# Patient Record
Sex: Female | Born: 1990 | Race: White | Hispanic: No | Smoking: Former smoker
Health system: Southern US, Community
[De-identification: ages and names within clinical notes are randomized; demographics above are authoritative.]

## PROBLEM LIST (undated history)

## (undated) ENCOUNTER — Inpatient Hospital Stay (HOSPITAL_COMMUNITY): Payer: Self-pay

## (undated) DIAGNOSIS — R41840 Attention and concentration deficit: Secondary | ICD-10-CM

## (undated) DIAGNOSIS — D649 Anemia, unspecified: Secondary | ICD-10-CM

## (undated) DIAGNOSIS — F319 Bipolar disorder, unspecified: Secondary | ICD-10-CM

## (undated) DIAGNOSIS — B009 Herpesviral infection, unspecified: Secondary | ICD-10-CM

## (undated) DIAGNOSIS — R87629 Unspecified abnormal cytological findings in specimens from vagina: Secondary | ICD-10-CM

## (undated) DIAGNOSIS — F419 Anxiety disorder, unspecified: Secondary | ICD-10-CM

## (undated) DIAGNOSIS — N946 Dysmenorrhea, unspecified: Secondary | ICD-10-CM

## (undated) DIAGNOSIS — J189 Pneumonia, unspecified organism: Secondary | ICD-10-CM

## (undated) DIAGNOSIS — N200 Calculus of kidney: Secondary | ICD-10-CM

## (undated) DIAGNOSIS — N390 Urinary tract infection, site not specified: Secondary | ICD-10-CM

## (undated) DIAGNOSIS — O009 Unspecified ectopic pregnancy without intrauterine pregnancy: Secondary | ICD-10-CM

## (undated) DIAGNOSIS — G4730 Sleep apnea, unspecified: Secondary | ICD-10-CM

## (undated) DIAGNOSIS — N83209 Unspecified ovarian cyst, unspecified side: Secondary | ICD-10-CM

## (undated) DIAGNOSIS — F32A Depression, unspecified: Secondary | ICD-10-CM

## (undated) DIAGNOSIS — Z202 Contact with and (suspected) exposure to infections with a predominantly sexual mode of transmission: Secondary | ICD-10-CM

## (undated) DIAGNOSIS — C419 Malignant neoplasm of bone and articular cartilage, unspecified: Secondary | ICD-10-CM

## (undated) DIAGNOSIS — R51 Headache: Secondary | ICD-10-CM

## (undated) DIAGNOSIS — R519 Headache, unspecified: Secondary | ICD-10-CM

## (undated) DIAGNOSIS — K219 Gastro-esophageal reflux disease without esophagitis: Secondary | ICD-10-CM

## (undated) HISTORY — PX: TONSILLECTOMY: SUR1361

## (undated) HISTORY — PX: DILATION AND CURETTAGE OF UTERUS: SHX78

## (undated) HISTORY — DX: Anxiety disorder, unspecified: F41.9

## (undated) HISTORY — PX: TONSILECTOMY, ADENOIDECTOMY, BILATERAL MYRINGOTOMY AND TUBES: SHX2538

## (undated) HISTORY — DX: Unspecified abnormal cytological findings in specimens from vagina: R87.629

## (undated) HISTORY — PX: TUBAL LIGATION: SHX77

## (undated) HISTORY — PX: NASAL SINUS SURGERY: SHX719

## (undated) HISTORY — DX: Contact with and (suspected) exposure to infections with a predominantly sexual mode of transmission: Z20.2

---

## 2011-09-12 ENCOUNTER — Emergency Department (HOSPITAL_COMMUNITY): Payer: BC Managed Care – PPO

## 2011-09-12 ENCOUNTER — Encounter (HOSPITAL_COMMUNITY): Payer: Self-pay

## 2011-09-12 ENCOUNTER — Observation Stay (HOSPITAL_COMMUNITY)
Admission: EM | Admit: 2011-09-12 | Discharge: 2011-09-13 | Disposition: A | Discharge status: Home / Self Care | Payer: BC Managed Care – PPO | Attending: Emergency Medicine | Admitting: Emergency Medicine

## 2011-09-12 DIAGNOSIS — N949 Unspecified condition associated with female genital organs and menstrual cycle: Principal | ICD-10-CM | POA: Insufficient documentation

## 2011-09-12 DIAGNOSIS — R41840 Attention and concentration deficit: Secondary | ICD-10-CM | POA: Insufficient documentation

## 2011-09-12 DIAGNOSIS — F319 Bipolar disorder, unspecified: Secondary | ICD-10-CM | POA: Insufficient documentation

## 2011-09-12 DIAGNOSIS — K661 Hemoperitoneum: Secondary | ICD-10-CM

## 2011-09-12 DIAGNOSIS — R102 Pelvic and perineal pain: Secondary | ICD-10-CM | POA: Diagnosis present

## 2011-09-12 DIAGNOSIS — N83209 Unspecified ovarian cyst, unspecified side: Secondary | ICD-10-CM | POA: Diagnosis present

## 2011-09-12 DIAGNOSIS — N831 Corpus luteum cyst of ovary, unspecified side: Secondary | ICD-10-CM | POA: Insufficient documentation

## 2011-09-12 HISTORY — DX: Bipolar disorder, unspecified: F31.9

## 2011-09-12 HISTORY — DX: Attention and concentration deficit: R41.840

## 2011-09-12 LAB — URINALYSIS, MICROSCOPIC ONLY
Bilirubin Urine: NEGATIVE
Protein, ur: NEGATIVE mg/dL
Specific Gravity, Urine: 1.025 (ref 1.005–1.030)
Urobilinogen, UA: 0.2 mg/dL (ref 0.0–1.0)

## 2011-09-12 LAB — COMPREHENSIVE METABOLIC PANEL
ALT: 13 U/L (ref 0–35)
Albumin: 4.3 g/dL (ref 3.5–5.2)
Alkaline Phosphatase: 63 U/L (ref 39–117)
Potassium: 3.4 mEq/L — ABNORMAL LOW (ref 3.5–5.1)
Sodium: 137 mEq/L (ref 135–145)
Total Protein: 6.9 g/dL (ref 6.0–8.3)

## 2011-09-12 LAB — CBC
HCT: 40 % (ref 36.0–46.0)
HCT: 36.7 % (ref 36.0–46.0)
Hemoglobin: 12.2 g/dL (ref 12.0–15.0)
MCH: 30.4 pg (ref 26.0–34.0)
MCHC: 34 g/dL (ref 30.0–36.0)
MCHC: 33.2 g/dL (ref 30.0–36.0)
MCV: 89.5 fL (ref 78.0–100.0)
MCV: 90.8 fL (ref 78.0–100.0)
Platelets: 203 10*3/uL (ref 150–400)
RBC: 4.04 MIL/uL (ref 3.87–5.11)
RBC: 4.03 MIL/uL (ref 3.87–5.11)
RDW: 12.7 % (ref 11.5–15.5)
WBC: 12.3 10*3/uL — ABNORMAL HIGH (ref 4.0–10.5)
WBC: 11.1 10*3/uL — ABNORMAL HIGH (ref 4.0–10.5)

## 2011-09-12 LAB — DIFFERENTIAL
Basophils Absolute: 0 10*3/uL (ref 0.0–0.1)
Basophils Relative: 0 % (ref 0–1)
Eosinophils Relative: 1 % (ref 0–5)
Monocytes Absolute: 0.6 10*3/uL (ref 0.1–1.0)

## 2011-09-12 MED ORDER — OXYCODONE-ACETAMINOPHEN 5-325 MG PO TABS
1.0000 | ORAL_TABLET | ORAL | Status: DC | PRN
  Administered 2011-09-12 (×2): 2 via ORAL
  Administered 2011-09-13: 1 via ORAL
  Administered 2011-09-13: 2 via ORAL
  Administered 2011-09-13: 1 via ORAL
  Filled 2011-09-12: qty 2
  Filled 2011-09-12: qty 1
  Filled 2011-09-12 (×3): qty 2

## 2011-09-12 MED ORDER — NALOXONE HCL 0.4 MG/ML IJ SOLN: 0.4000 mg | INTRAMUSCULAR | Status: DC | PRN

## 2011-09-12 MED ORDER — DIPHENHYDRAMINE HCL 50 MG/ML IJ SOLN: 12.5000 mg | Freq: Four times a day (QID) | INTRAMUSCULAR | Status: DC | PRN

## 2011-09-12 MED ORDER — MORPHINE SULFATE 4 MG/ML IJ SOLN
6.0000 mg | Freq: Once | INTRAMUSCULAR | Status: AC
  Administered 2011-09-12: 6 mg via INTRAVENOUS
  Filled 2011-09-12: qty 2

## 2011-09-12 MED ORDER — HYDROMORPHONE 0.3 MG/ML IV SOLN
INTRAVENOUS | Status: DC
  Filled 2011-09-12: qty 25

## 2011-09-12 MED ORDER — ONDANSETRON HCL 4 MG/2ML IJ SOLN
4.0000 mg | Freq: Once | INTRAMUSCULAR | Status: AC
  Administered 2011-09-12: 4 mg via INTRAVENOUS
  Filled 2011-09-12: qty 2

## 2011-09-12 MED ORDER — DEXTROSE IN LACTATED RINGERS 5 % IV SOLN
INTRAVENOUS | Status: DC
  Administered 2011-09-12: 21:00:00 via INTRAVENOUS

## 2011-09-12 MED ORDER — ONDANSETRON HCL 4 MG/2ML IJ SOLN: 4.0000 mg | Freq: Once | INTRAMUSCULAR | Status: DC

## 2011-09-12 MED ORDER — IBUPROFEN 600 MG PO TABS: ORAL_TABLET | ORAL | Status: DC

## 2011-09-12 MED ORDER — ONDANSETRON HCL 4 MG/2ML IJ SOLN: 4.0000 mg | Freq: Four times a day (QID) | INTRAMUSCULAR | Status: DC | PRN

## 2011-09-12 MED ORDER — SODIUM CHLORIDE 0.9 % IJ SOLN: 9.0000 mL | INTRAMUSCULAR | Status: DC | PRN

## 2011-09-12 MED ORDER — HYDROMORPHONE HCL PF 1 MG/ML IJ SOLN
1.0000 mg | Freq: Once | INTRAMUSCULAR | Status: AC
  Administered 2011-09-12: 1 mg via INTRAVENOUS
  Filled 2011-09-12: qty 1

## 2011-09-12 MED ORDER — HYDROMORPHONE 0.3 MG/ML IV SOLN: INTRAVENOUS | Status: DC

## 2011-09-12 MED ORDER — OXYCODONE-ACETAMINOPHEN 5-325 MG PO TABS: 1.0000 | ORAL_TABLET | ORAL | Status: AC | PRN

## 2011-09-12 MED ORDER — DIPHENHYDRAMINE HCL 12.5 MG/5ML PO ELIX
12.5000 mg | ORAL_SOLUTION | Freq: Four times a day (QID) | ORAL | Status: DC | PRN
Start: 2011-09-12 — End: 2011-09-13

## 2011-09-12 MED ORDER — MORPHINE SULFATE 4 MG/ML IJ SOLN
6.0000 mg | Freq: Once | INTRAMUSCULAR | Status: AC
  Administered 2011-09-12: 6 mg via INTRAVENOUS
  Filled 2011-09-12: qty 1

## 2011-09-12 MED ORDER — PRENATAL MULTIVITAMIN CH: 1.0000 | ORAL_TABLET | Freq: Every day | ORAL | Status: DC

## 2011-09-12 NOTE — ED Notes (Addendum)
Spoke with mother on phone,

## 2011-09-12 NOTE — Consult Note (Signed)
Reason for Consult:pelvic pain. Hemorrhagic ovarian cyst. Hemoperitoneum. Referring Physician: Dr. Azalia Bilis  Danielle Evans is an 21 y.o. female.   Pertinent Gynecological History: Menses: regular on oral contraceptive pill Bleeding: occasional breakthrough bleeding Contraception: Ortho Tri-Cyclen though this is not taken on a regular basis Sexually transmitted diseases: no past history Previous GYN Procedures: none  Last mammogram: na Date: na Last pap: na Date: na OB History:G0P0   Menstrual History:  Patient's last menstrual period was 08/29/2011.    Past Medical History  Diagnosis Date  . Bipolar 1 disorder   . Attention and concentration deficit     History reviewed. No pertinent past surgical history.  Family History  Problem Relation Age of Onset  . Cancer Father   . Heart disease Father   . Cancer Paternal Aunt     Social History:  does not have a smoking history on file. She does not have any smokeless tobacco history on file. She reports that she drinks alcohol. Her drug history not on file.  Allergies:  Allergies  Allergen Reactions  . Ceclor (Cefaclor)     Medications: I have reviewed the patient's current medications Gastrointestinal ROS: positive for - abdominal pain and appetite loss  Blood pressure 113/59, pulse 81, temperature 97.8 F (36.6 C), temperature source Oral, resp. rate 18, height 5\' 7"  (1.702 m), weight 199 lb 12 oz (90.606 kg), last menstrual period 08/29/2011, SpO2 97.00%. .the abdomen is soft without organomegaly. There is tenderness to deep palpation in the lower abdomen that is greater than in the epigastrium. There is no rebound. There is no CVA tenderness. Pelvic examination: Deferred  Results for orders placed during the hospital encounter of 09/12/11 (from the past 48 hour(s))  COMPREHENSIVE METABOLIC PANEL     Status: Abnormal   Collection Time   09/12/11  5:00 AM      Component Value Range Comment   Sodium 137  135 -  145 (mEq/L)    Potassium 3.4 (*) 3.5 - 5.1 (mEq/L)    Chloride 104  96 - 112 (mEq/L)    CO2 21  19 - 32 (mEq/L)    Glucose, Bld 99  70 - 99 (mg/dL)    BUN 11  6 - 23 (mg/dL)    Creatinine, Ser 1.61  0.50 - 1.10 (mg/dL)    Calcium 9.4  8.4 - 10.5 (mg/dL)    Total Protein 6.9  6.0 - 8.3 (g/dL)    Albumin 4.3  3.5 - 5.2 (g/dL)    AST 22  0 - 37 (U/L)    ALT 13  0 - 35 (U/L)    Alkaline Phosphatase 63  39 - 117 (U/L)    Total Bilirubin 0.4  0.3 - 1.2 (mg/dL)    GFR calc non Af Amer >90  >90 (mL/min)    GFR calc Af Amer >90  >90 (mL/min)   CBC     Status: Normal   Collection Time   09/12/11  5:00 AM      Component Value Range Comment   WBC 8.6  4.0 - 10.5 (K/uL)    RBC 4.47  3.87 - 5.11 (MIL/uL)    Hemoglobin 13.6  12.0 - 15.0 (g/dL)    HCT 09.6  04.5 - 40.9 (%)    MCV 89.5  78.0 - 100.0 (fL)    MCH 30.4  26.0 - 34.0 (pg)    MCHC 34.0  30.0 - 36.0 (g/dL)    RDW 81.1  91.4 - 78.2 (%)  Platelets 232  150 - 400 (K/uL)   DIFFERENTIAL     Status: Normal   Collection Time   09/12/11  5:00 AM      Component Value Range Comment   Neutrophils Relative 66  43 - 77 (%)    Neutro Abs 5.7  1.7 - 7.7 (K/uL)    Lymphocytes Relative 27  12 - 46 (%)    Lymphs Abs 2.3  0.7 - 4.0 (K/uL)    Monocytes Relative 6  3 - 12 (%)    Monocytes Absolute 0.6  0.1 - 1.0 (K/uL)    Eosinophils Relative 1  0 - 5 (%)    Eosinophils Absolute 0.1  0.0 - 0.7 (K/uL)    Basophils Relative 0  0 - 1 (%)    Basophils Absolute 0.0  0.0 - 0.1 (K/uL)   PREGNANCY, URINE     Status: Normal   Collection Time   09/12/11  5:45 AM      Component Value Range Comment   Preg Test, Ur NEGATIVE  NEGATIVE    URINALYSIS, WITH MICROSCOPIC     Status: Abnormal   Collection Time   09/12/11  5:45 AM      Component Value Range Comment   Color, Urine YELLOW  YELLOW     APPearance CLEAR  CLEAR     Specific Gravity, Urine 1.025  1.005 - 1.030     pH 6.5  5.0 - 8.0     Glucose, UA NEGATIVE  NEGATIVE (mg/dL)    Hgb urine dipstick  LARGE (*) NEGATIVE     Bilirubin Urine NEGATIVE  NEGATIVE     Ketones, ur TRACE (*) NEGATIVE (mg/dL)    Protein, ur NEGATIVE  NEGATIVE (mg/dL)    Urobilinogen, UA 0.2  0.0 - 1.0 (mg/dL)    Nitrite NEGATIVE  NEGATIVE     Leukocytes, UA NEGATIVE  NEGATIVE     WBC, UA 0-2  <3 (WBC/hpf)    RBC / HPF 0-2  <3 (RBC/hpf)    Bacteria, UA RARE  RARE     Squamous Epithelial / LPF FEW (*) RARE    CBC     Status: Abnormal   Collection Time   09/12/11 10:10 AM      Component Value Range Comment   WBC 12.3 (*) 4.0 - 10.5 (K/uL)    RBC 4.04  3.87 - 5.11 (MIL/uL)    Hemoglobin 12.1  12.0 - 15.0 (g/dL)    HCT 16.1  09.6 - 04.5 (%)    MCV 90.8  78.0 - 100.0 (fL)    MCH 30.0  26.0 - 34.0 (pg)    MCHC 33.0  30.0 - 36.0 (g/dL)    RDW 40.9  81.1 - 91.4 (%)    Platelets 203  150 - 400 (K/uL)   CBC     Status: Abnormal   Collection Time   09/12/11  2:35 PM      Component Value Range Comment   WBC 11.1 (*) 4.0 - 10.5 (K/uL)    RBC 4.03  3.87 - 5.11 (MIL/uL)    Hemoglobin 12.2  12.0 - 15.0 (g/dL)    HCT 78.2  95.6 - 21.3 (%)    MCV 91.1  78.0 - 100.0 (fL)    MCH 30.3  26.0 - 34.0 (pg)    MCHC 33.2  30.0 - 36.0 (g/dL)    RDW 08.6  57.8 - 46.9 (%)    Platelets 209  150 - 400 (K/uL)  Ct Abdomen Pelvis Wo Contrast  09/12/2011  *RADIOLOGY REPORT*  Clinical Data: Sudden onset of lower abdominal pain.  Pain is worse on the right.  Microhematuria.  CT ABDOMEN AND PELVIS WITHOUT CONTRAST  Technique:  Multidetector CT imaging of the abdomen and pelvis was performed following the standard protocol without intravenous contrast.  Comparison: None.  Findings: Focal airspace disease the right lung base likely represents atelectasis.  Lungs are otherwise clear.  Heart size is normal.  The liver and spleen are within normal limits.  The stomach, duodenum, and pancreas are unremarkable.  The common bile duct and gallbladder are within normal limits.  The adrenal glands and kidneys are normal.  Ureters are within normal  limits to the urinary bladder.  The rectosigmoid colon is within normal limits.  The remainder of the colon is unremarkable.  Moderate abdominal ascites is most prominent in the lower abdomen pelvis.  There is some fluid about the spleen.  Fluid is high density, compatible with blood products. The mixed density mass in the left adnexa measures 4.9 x 3.6 x 4.3 cm.  The right adnexa is within normal limits.  The uterus is unremarkable.  There is no free air.  The bone windows are unremarkable.  IMPRESSION:  1.  High-density abdominal ascites likely represents blood products, predominately focused in the anatomic pelvis.  There is some fluid about the spleen. Focal hyperdensity within the anatomic pelvis likely represents clot. 2.  Mixed density mass in the left adnexa measures 4.9 cm.  This likely represents a hemorrhagic cyst and is felt be the source of the hemoperitoneum.  Original Report Authenticated By: Jamesetta Orleans. MATTERN, M.D.    Assessment: 1 pelvic pain left ovarian cyst and hemoperitoneum  Plan: The patient will be admitted at [facility] for observation. She does not have a surgical abdomen and if her hemoglobin remains stable and she is able to resume gastrointestinal function with tolerating a regular diet she will be tried on oral pain medication. If that relieves her pain and she tolerates it she will be discharged home.  Jaiyden Laur P 09/12/2011

## 2011-09-12 NOTE — Discharge Summary (Signed)
Physician Discharge Summary  Patient ID: Danielle Evans MRN: [id number] DOB/AGE: 1990/09/06 21 y.o.  Admit date: 09/12/2011 Discharge date: 09/12/2011  Admission Diagnoses:pelvic pain  Discharge Diagnoses:  Active Problems:  Pelvic pain  Hemoperitoneum  Ovarian cyst   Discharged Condition: stable  Hospital Course: the patient was seen in the [facility] long emergency department with a complaint of the rapid onset of pelvic abdominal and some back pain here.  Her last menstrual period had been on 08/29/2011. She is taking Ortho Tri-Cyclen oral contraceptive pills but is not taking them consistently. She states that over the last couple of days at decreasing appetite but no significant nausea or vomiting. She likewise denies change in her bowel habits. She underwent a workup which included a negative pregnancy test. A CBC which was essentially within normal limits with a hemoglobin of 13.6, and a pelvic CT scan consistent with a ruptured corpus luteum cyst and hemoperitoneum. The patient was treated with IV analgesics in the [facility] long emergency department and was transferred to [facility] for further gynecologic consultation and  Management.  She was observed over approximately 8 hours during which time she required a single dose of Dilaudid 1 mg IV for improvement in her pain from a level of 6 to a level of 2-3. She had documented stable ligation of her hemoglobin in the 12.1-12.2 range over a 4-5 hour. She had no deterioration in her physical examination.  Her orthostatic pulses and blood pressures were stable. She was able to eat a regular diet and to take oral pain medication.  She was not deemed ready for discharge home.  Consults: gynecology  Significant Diagnostic Studies: CT  Of abdomen and pelvis, CBC  Treatments: analgesia: Dilaudid and Percocer  Discharge Exam: Blood pressure 113/59, pulse 81, temperature 97.8 F (36.6 C), temperature source Oral, resp. rate 18, height 5\' 7"   (1.702 m), weight 199 lb 12 oz (90.606 kg), last menstrual period 08/29/2011, SpO2 97.00%. GI: soft, non-tender; bowel sounds normal; no masses,  no organomegaly No rebound.  Disposition: Discharge home.   Excuse from work until 09/16/11  Discharge Orders    Future Appointments: Provider: Department: Dept Phone: Center:   09/24/2011 1:45 PM Hal Morales, MD [facility] [phone] None     Medication List  As of 09/12/2011  5:51 PM   TAKE these medications         amphetamine-dextroamphetamine 30 MG 24 hr capsule   Commonly known as: ADDERALL XR   Take 30 mg by mouth every morning.      ibuprofen 600 MG tablet   Commonly known as: ADVIL,MOTRIN   One tablet every 6 hours as needed for mild to moderate pain      lamoTRIgine 25 MG tablet   Commonly known as: LAMICTAL   Take 50 mg by mouth daily.      ORTHO TRI-CYCLEN (28) 0.18/0.215/0.25 MG-35 MCG tablet   Generic drug: Norgestimate-Ethinyl Estradiol Triphasic   Take 1 tablet by mouth daily.      oxyCODONE-acetaminophen 5-325 MG per tablet   Commonly known as: PERCOCET   Take 1 tablet by mouth every 4 (four) hours as needed for pain.           Follow-up Information    Follow up with Hal Morales, MD on 09/24/2011. (09/24/11 @145pm )    Contact information:   [address] [place] [address] [phone]          Signed: Hal Morales 09/12/2011, 5:51 PM

## 2011-09-12 NOTE — MAU Note (Signed)
Patient received by [facility] to room 1 in MAU side rails up for safety patient rating her pain 6/10.

## 2011-09-12 NOTE — MAU Note (Signed)
Notified Dr. Pennie Rushing patient arrived to MAU in room 1 from [facility] via [facility], received order for CBC, MD unable to evaluate patient until finished in OR.

## 2011-09-12 NOTE — ED Provider Notes (Signed)
History     CSN: [id number]  Arrival date & time 09/12/11  0445   First MD Initiated Contact with Patient 09/12/11 [phone]      Chief Complaint  Patient presents with  . Abdominal Pain     HPI The patient reports acute onset lower abdominal pain that started approximately 30 minutes prior to arrival.  She's never had pain like this before.  She reports nausea without vomiting.  She denies diarrhea.  She denies vaginal bleeding.  Her last normal menstrual cycle was approximately 3 weeks ago.  She denies dysuria and urinary frequency.  Her pain is moderate to severe at this time.  Nothing worsens her pain.  Nothing improves her pain.  Her pain is constant   Past Medical History  Diagnosis Date  . Bipolar 1 disorder   . Attention and concentration deficit     History reviewed. No pertinent past surgical history.  History reviewed. No pertinent family history.  History  Substance Use Topics  . Smoking status: Not on file  . Smokeless tobacco: Not on file  . Alcohol Use: Yes    OB History    Grav Para Term Preterm Abortions TAB SAB Ect Mult Living                  Review of Systems  All other systems reviewed and are negative.    Allergies  Ceclor  Home Medications   Current Outpatient Rx  Name Route Sig Dispense Refill  . AMPHETAMINE-DEXTROAMPHET ER 30 MG PO CP24 Oral Take 30 mg by mouth every morning.    Marland Kitchen LAMOTRIGINE 25 MG PO TABS Oral Take 50 mg by mouth daily.      BP 118/64  Pulse 72  Temp(Src) 98.5 F (36.9 C) (Oral)  Resp 20  SpO2 100%  LMP 08/29/2011  Physical Exam  Nursing note and vitals reviewed. Constitutional: She is oriented to person, place, and time. She appears well-developed and well-nourished. No distress.  HENT:  Head: Normocephalic and atraumatic.  Eyes: EOM are normal.  Neck: Normal range of motion.  Cardiovascular: Normal rate, regular rhythm and normal heart sounds.   Pulmonary/Chest: Effort normal and breath sounds normal.    Abdominal: Soft. She exhibits no distension.       Left lower quadrant tenderness mild guarding, no rebound, no peritonitis  Musculoskeletal: Normal range of motion.  Neurological: She is alert and oriented to person, place, and time.  Skin: Skin is warm and dry.  Psychiatric: She has a normal mood and affect. Judgment normal.    ED Course  Procedures (including critical care time)  Labs Reviewed  COMPREHENSIVE METABOLIC PANEL - Abnormal; Notable for the following:    Potassium 3.4 (*)    All other components within normal limits  URINALYSIS, WITH MICROSCOPIC - Abnormal; Notable for the following:    Hgb urine dipstick LARGE (*)    Ketones, ur TRACE (*)    Squamous Epithelial / LPF FEW (*)    All other components within normal limits  CBC  DIFFERENTIAL  PREGNANCY, URINE   Ct Abdomen Pelvis Wo Contrast  09/12/2011  *RADIOLOGY REPORT*  Clinical Data: Sudden onset of lower abdominal pain.  Pain is worse on the right.  Microhematuria.  CT ABDOMEN AND PELVIS WITHOUT CONTRAST  Technique:  Multidetector CT imaging of the abdomen and pelvis was performed following the standard protocol without intravenous contrast.  Comparison: None.  Findings: Focal airspace disease the right lung base likely represents atelectasis.  Lungs  are otherwise clear.  Heart size is normal.  The liver and spleen are within normal limits.  The stomach, duodenum, and pancreas are unremarkable.  The common bile duct and gallbladder are within normal limits.  The adrenal glands and kidneys are normal.  Ureters are within normal limits to the urinary bladder.  The rectosigmoid colon is within normal limits.  The remainder of the colon is unremarkable.  Moderate abdominal ascites is most prominent in the lower abdomen pelvis.  There is some fluid about the spleen.  Fluid is high density, compatible with blood products. The mixed density mass in the left adnexa measures 4.9 x 3.6 x 4.3 cm.  The right adnexa is within normal  limits.  The uterus is unremarkable.  There is no free air.  The bone windows are unremarkable.  IMPRESSION:  1.  High-density abdominal ascites likely represents blood products, predominately focused in the anatomic pelvis.  There is some fluid about the spleen. Focal hyperdensity within the anatomic pelvis likely represents clot. 2.  Mixed density mass in the left adnexa measures 4.9 cm.  This likely represents a hemorrhagic cyst and is felt be the source of the hemoperitoneum.  Original Report Authenticated By: Jamesetta Orleans. MATTERN, M.D.   I personally reviewed the patient's images  1. Hemorrhagic ovarian cyst   2. Hemoperitoneum       MDM  The patient appears to have a left-sided hemorrhagic cyst with moderate amount of free fluid in her abdomen concerning for ongoing hemorrhagic bleed.  The patient continues to have pain additional pain medicine and has been given.  Awaiting callback from GYN at this time as she may require laparoscopy to evaluate given the fact that there is blood products around her spleen.  Her vital signs remained stable however at this time.    8:19 AM Her current heart rate is 86 her blood pressure is 118/76  8:31 AM Accepted to [facility] at [facility] by Dr Karna Christmas, MD 09/12/11 [phone]

## 2011-09-12 NOTE — ED Notes (Signed)
Pt states she acutely started hurting about 30 minutes ago in her lower abdomen

## 2011-09-12 NOTE — Discharge Instructions (Signed)
Arrive at 115 pm for your appointment to be able to fill out new patient paperwork.    Ovarian Cyst The ovaries are small organs that are on each side of the uterus. The ovaries are the organs that produce the female hormones, estrogen and progesterone. An ovarian cyst is a sac filled with fluid that can vary in its size. It is normal for a small cyst to form in women who are in the childbearing age and who have menstrual periods. This type of cyst is called a follicle cyst that becomes an ovulation cyst (corpus luteum cyst) after it produces the women's egg. It later goes away on its own if the woman does not become pregnant. There are other kinds of ovarian cysts that may cause problems and may need to be treated. The most serious problem is a cyst with cancer. It should be noted that menopausal women who have an ovarian cyst are at a higher risk of it being a cancer cyst. They should be evaluated very quickly, thoroughly and followed closely. This is especially true in menopausal women because of the high rate of ovarian cancer in women in menopause. CAUSES AND TYPES OF OVARIAN CYSTS:  FUNCTIONAL CYST: The follicle/corpus luteum cyst is a functional cyst that occurs every month during ovulation with the menstrual cycle. They go away with the next menstrual cycle if the woman does not get pregnant. Usually, there are no symptoms with a functional cyst.   ENDOMETRIOMA CYST: This cyst develops from the lining of the uterus tissue. This cyst gets in or on the ovary. It grows every month from the bleeding during the menstrual period. It is also called a "chocolate cyst" because it becomes filled with blood that turns brown. This cyst can cause pain in the lower abdomen during intercourse and with your menstrual period.   CYSTADENOMA CYST: This cyst develops from the cells on the outside of the ovary. They usually are not cancerous. They can get very big and cause lower abdomen pain and pain with  intercourse. This type of cyst can twist on itself, cut off its blood supply and cause severe pain. It also can easily rupture and cause a lot of pain.   DERMOID CYST: This type of cyst is sometimes found in both ovaries. They are found to have different kinds of body tissue in the cyst. The tissue includes skin, teeth, hair, and/or cartilage. They usually do not have symptoms unless they get very big. Dermoid cysts are rarely cancerous.   POLYCYSTIC OVARY: This is a rare condition with hormone problems that produces many small cysts on both ovaries. The cysts are follicle-like cysts that never produce an egg and become a corpus luteum. It can cause an increase in body weight, infertility, acne, increase in body and facial hair and lack of menstrual periods or rare menstrual periods. Many women with this problem develop type 2 diabetes. The exact cause of this problem is unknown. A polycystic ovary is rarely cancerous.   THECA LUTEIN CYST: Occurs when too much hormone (human chorionic gonadotropin) is produced and over-stimulates the ovaries to produce an egg. They are frequently seen when doctors stimulate the ovaries for invitro-fertilization (test tube babies).   LUTEOMA CYST: This cyst is seen during pregnancy. Rarely it can cause an obstruction to the birth canal during labor and delivery. They usually go away after delivery.  SYMPTOMS   Pelvic pain or pressure.   Pain during sexual intercourse.   Increasing girth (swelling)  of the abdomen.   Abnormal menstrual periods.   Increasing pain with menstrual periods.   You stop having menstrual periods and you are not pregnant.  DIAGNOSIS  The diagnosis can be made during:  Routine or annual pelvic examination (common).   Ultrasound.   X-ray of the pelvis.   CT Scan.   MRI.   Blood tests.  TREATMENT   Treatment may only be to follow the cyst monthly for 2 to 3 months with your caregiver. Many go away on their own, especially  functional cysts.   May be aspirated (drained) with a long needle with ultrasound, or by laparoscopy (inserting a tube into the pelvis through a small incision).   The whole cyst can be removed by laparoscopy.   Sometimes the cyst may need to be removed through an incision in the lower abdomen.   Hormone treatment is sometimes used to help dissolve certain cysts.   Birth control pills are sometimes used to help dissolve certain cysts.  HOME CARE INSTRUCTIONS  Follow your caregiver's advice regarding:  Medicine.   Follow up visits to evaluate and treat the cyst.   You may need to come back or make an appointment with another caregiver, to find the exact cause of your cyst, if your caregiver is not a gynecologist.   Get your yearly and recommended pelvic examinations and Pap tests.   Let your caregiver know if you have had an ovarian cyst in the past.  SEEK MEDICAL CARE IF:   Your periods are late, irregular, they stop, or are painful.   Your stomach (abdomen) or pelvic pain does not go away.   Your stomach becomes larger or swollen.   You have pressure on your bladder or trouble emptying your bladder completely.   You have painful sexual intercourse.   You have feelings of fullness, pressure, or discomfort in your stomach.   You lose weight for no apparent reason.   You feel generally ill.   You become constipated.   You lose your appetite.   You develop acne.   You have an increase in body and facial hair.   You are gaining weight, without changing your exercise and eating habits.   You think you are pregnant.  SEEK IMMEDIATE MEDICAL CARE IF:   You have increasing abdominal pain.   You feel sick to your stomach (nausea) and/or vomit.   You develop a fever that comes on suddenly.   You develop abdominal pain during a bowel movement.   Your menstrual periods become heavier than usual.  Document Released: 04/15/2005 Document Revised: 04/04/2011 Document  Reviewed: 02/16/2009 [facility] Patient Information 2012 [place], [place].

## 2011-09-13 ENCOUNTER — Encounter (HOSPITAL_COMMUNITY): Payer: Self-pay | Admitting: *Deleted

## 2011-09-13 DIAGNOSIS — K661 Hemoperitoneum: Secondary | ICD-10-CM

## 2011-09-13 DIAGNOSIS — N949 Unspecified condition associated with female genital organs and menstrual cycle: Secondary | ICD-10-CM

## 2011-09-13 MED ORDER — OXYCODONE-ACETAMINOPHEN 5-325 MG PO TABS: 1.0000 | ORAL_TABLET | ORAL | Status: AC | PRN

## 2011-09-13 NOTE — Progress Notes (Signed)
Pt. Is discharged in the care of father. Downstairs per ambulatory .Stable . Denies pain or discomfort, heavy vaginal bleeding.stanle.

## 2011-09-13 NOTE — Progress Notes (Addendum)
Subjective: Patient reports tolerating PO, + flatus and no problems voiding.    Objective: I have reviewed patient's vital signs, intake and output, medications, labs and microbiology.  General: alert, cooperative and no distress Resp: clear to auscultation bilaterally Cardio: regular rate and rhythm, S1, S2 normal, no murmur, click, rub or gallop GI: soft, non-tender; bowel sounds normal; no masses,  no organomegaly  CBC    Component Value Date/Time   WBC 11.1* 09/12/2011 1435   RBC 4.03 09/12/2011 1435   HGB 12.2 09/12/2011 1435   HCT 36.7 09/12/2011 1435   PLT 209 09/12/2011 1435   MCV 91.1 09/12/2011 1435   MCH 30.3 09/12/2011 1435   MCHC 33.2 09/12/2011 1435   RDW 12.8 09/12/2011 1435   LYMPHSABS 2.3 09/12/2011 0500   MONOABS 0.6 09/12/2011 0500   EOSABS 0.1 09/12/2011 0500   BASOSABS 0.0 09/12/2011 0500      Assessment/Plan: Improved pain. Home today. Motrin and Percocet for pain. RTO in 2-4 weeks. RTW on Wed, Sep 18, 2011. Check GC and CHL from urine prior to discharge.   LOS: 1 day    Brahim Dolman V 09/13/2011, 8:47 AM

## 2011-09-13 NOTE — Progress Notes (Signed)
IV d/c'd per patient and family request.  Patient and family members requested pain med q4h even when patient is asleep.  When patient is awake, laughing and talking with family members. Instructed patient to ask for pain med when needed, not because it has been 4 hours.

## 2011-09-13 NOTE — Progress Notes (Signed)
UR Chart review completed.  

## 2011-09-14 LAB — GC/CHLAMYDIA PROBE AMP, URINE
Chlamydia, Swab/Urine, PCR: NEGATIVE
GC Probe Amp, Urine: NEGATIVE

## 2011-09-24 ENCOUNTER — Encounter: Payer: Self-pay | Admitting: Obstetrics and Gynecology

## 2011-09-24 ENCOUNTER — Ambulatory Visit (INDEPENDENT_AMBULATORY_CARE_PROVIDER_SITE_OTHER): Payer: BC Managed Care – PPO | Admitting: Obstetrics and Gynecology

## 2011-09-24 VITALS — BP 112/60 | Ht 67.5 in | Wt 191.0 lb

## 2011-09-24 DIAGNOSIS — N949 Unspecified condition associated with female genital organs and menstrual cycle: Secondary | ICD-10-CM

## 2011-09-24 DIAGNOSIS — N926 Irregular menstruation, unspecified: Secondary | ICD-10-CM

## 2011-09-24 DIAGNOSIS — N939 Abnormal uterine and vaginal bleeding, unspecified: Secondary | ICD-10-CM

## 2011-09-24 DIAGNOSIS — K661 Hemoperitoneum: Secondary | ICD-10-CM

## 2011-09-24 DIAGNOSIS — R102 Pelvic and perineal pain: Secondary | ICD-10-CM

## 2011-09-24 DIAGNOSIS — N83209 Unspecified ovarian cyst, unspecified side: Secondary | ICD-10-CM

## 2011-09-24 MED ORDER — NORETHINDRONE-ETH ESTRADIOL 1-35 MG-MCG PO TABS: ORAL_TABLET | ORAL | Status: DC

## 2011-09-24 MED ORDER — OXYCODONE-ACETAMINOPHEN 5-325 MG PO TABS: 1.0000 | ORAL_TABLET | ORAL | Status: AC | PRN

## 2011-09-24 MED ORDER — TRAMADOL HCL 50 MG PO TABS: 50.0000 mg | ORAL_TABLET | Freq: Four times a day (QID) | ORAL | Status: DC | PRN

## 2011-09-24 NOTE — Progress Notes (Signed)
Pt was seen in hospital 09/05/2011. States that she had a cyst on her left ovary that had ruptured. Still having bleeding, back pain and cramping abd pain.   Contraception: none History of STD:  no history of PID, STD's History of ovarian cyst: yes:  08/2011 was seen in hospital. Cyst ruptured.  History of fibroids: no History of endometriosis:no Previous ultrasound:no  Urinary symptoms: none Gastro-intestinal symptoms:  Constipation: no     Diarrhea: yes     Nausea: yes     Vomiting: no     Fever: no Vaginal discharge: no vaginal discharge  Dc'd BCPs on discharge from the hospital for unknown reason. Has bled since then. Had only been on Ortho Tricyclen for one cycle prior to cyst rupture. 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  EXAM: BP 112/60  Ht 5' 7.5" (1.715 m)  Wt 191 lb (86.637 kg)  BMI 29.47 kg/m2  LMP 08/29/2011 Abd: soft witout masses or organomegaly.  No rebound Pelvic: EGBUS;  WNL  Vagina:  Brown bloody d/c in vault  Cx:  Brown blood from os.  No CMT  Utx:  NSSC mobile nontender  Adnx:  No masses.  Minimal tenderness  Assessment: Ruptured ovarian cyst with hemoperitoneum, improving Long work hours exacerbating abdominal and back pain Abnormal uterine bleeding secondary to stopping BCP's  Recommendation: ON1/35 tid for 7 days to stop bleeding, then start once daily Ultram for pin while working then Percocet prn at night for pain  RTO 4 wks for [place] f/u of ruptured ovarian cyst.

## 2011-09-24 NOTE — Patient Instructions (Signed)
Ovarian Cyst The ovaries are small organs that are on each side of the uterus. The ovaries are the organs that produce the female hormones, estrogen and progesterone. An ovarian cyst is a sac filled with fluid that can vary in its size. It is normal for a small cyst to form in women who are in the childbearing age and who have menstrual periods. This type of cyst is called a follicle cyst that becomes an ovulation cyst (corpus luteum cyst) after it produces the women's egg. It later goes away on its own if the woman does not become pregnant. There are other kinds of ovarian cysts that may cause problems and may need to be treated. The most serious problem is a cyst with cancer. It should be noted that menopausal women who have an ovarian cyst are at a higher risk of it being a cancer cyst. They should be evaluated very quickly, thoroughly and followed closely. This is especially true in menopausal women because of the high rate of ovarian cancer in women in menopause. CAUSES AND TYPES OF OVARIAN CYSTS:  FUNCTIONAL CYST: The follicle/corpus luteum cyst is a functional cyst that occurs every month during ovulation with the menstrual cycle. They go away with the next menstrual cycle if the woman does not get pregnant. Usually, there are no symptoms with a functional cyst.   ENDOMETRIOMA CYST: This cyst develops from the lining of the uterus tissue. This cyst gets in or on the ovary. It grows every month from the bleeding during the menstrual period. It is also called a "chocolate cyst" because it becomes filled with blood that turns brown. This cyst can cause pain in the lower abdomen during intercourse and with your menstrual period.   CYSTADENOMA CYST: This cyst develops from the cells on the outside of the ovary. They usually are not cancerous. They can get very big and cause lower abdomen pain and pain with intercourse. This type of cyst can twist on itself, cut off its blood supply and cause severe pain.  It also can easily rupture and cause a lot of pain.   DERMOID CYST: This type of cyst is sometimes found in both ovaries. They are found to have different kinds of body tissue in the cyst. The tissue includes skin, teeth, hair, and/or cartilage. They usually do not have symptoms unless they get very big. Dermoid cysts are rarely cancerous.   POLYCYSTIC OVARY: This is a rare condition with hormone problems that produces many small cysts on both ovaries. The cysts are follicle-like cysts that never produce an egg and become a corpus luteum. It can cause an increase in body weight, infertility, acne, increase in body and facial hair and lack of menstrual periods or rare menstrual periods. Many women with this problem develop type 2 diabetes. The exact cause of this problem is unknown. A polycystic ovary is rarely cancerous.   THECA LUTEIN CYST: Occurs when too much hormone (human chorionic gonadotropin) is produced and over-stimulates the ovaries to produce an egg. They are frequently seen when doctors stimulate the ovaries for invitro-fertilization (test tube babies).   LUTEOMA CYST: This cyst is seen during pregnancy. Rarely it can cause an obstruction to the birth canal during labor and delivery. They usually go away after delivery.  SYMPTOMS   Pelvic pain or pressure.   Pain during sexual intercourse.   Increasing girth (swelling) of the abdomen.   Abnormal menstrual periods.   Increasing pain with menstrual periods.   You stop having   menstrual periods and you are not pregnant.  DIAGNOSIS  The diagnosis can be made during:  Routine or annual pelvic examination (common).   Ultrasound.   X-ray of the pelvis.   CT Scan.   MRI.   Blood tests.  TREATMENT   Treatment may only be to follow the cyst monthly for 2 to 3 months with your caregiver. Many go away on their own, especially functional cysts.   May be aspirated (drained) with a long needle with ultrasound, or by laparoscopy  (inserting a tube into the pelvis through a small incision).   The whole cyst can be removed by laparoscopy.   Sometimes the cyst may need to be removed through an incision in the lower abdomen.   Hormone treatment is sometimes used to help dissolve certain cysts.   Birth control pills are sometimes used to help dissolve certain cysts.  HOME CARE INSTRUCTIONS  Follow your caregiver's advice regarding:  Medicine.   Follow up visits to evaluate and treat the cyst.   You may need to come back or make an appointment with another caregiver, to find the exact cause of your cyst, if your caregiver is not a gynecologist.   Get your yearly and recommended pelvic examinations and Pap tests.   Let your caregiver know if you have had an ovarian cyst in the past.  SEEK MEDICAL CARE IF:   Your periods are late, irregular, they stop, or are painful.   Your stomach (abdomen) or pelvic pain does not go away.   Your stomach becomes larger or swollen.   You have pressure on your bladder or trouble emptying your bladder completely.   You have painful sexual intercourse.   You have feelings of fullness, pressure, or discomfort in your stomach.   You lose weight for no apparent reason.   You feel generally ill.   You become constipated.   You lose your appetite.   You develop acne.   You have an increase in body and facial hair.   You are gaining weight, without changing your exercise and eating habits.   You think you are pregnant.  SEEK IMMEDIATE MEDICAL CARE IF:   You have increasing abdominal pain.   You feel sick to your stomach (nausea) and/or vomit.   You develop a fever that comes on suddenly.   You develop abdominal pain during a bowel movement.   Your menstrual periods become heavier than usual.  Document Released: 04/15/2005 Document Revised: 04/04/2011 Document Reviewed: 02/16/2009 [employer] Patient Information 2012 [employer]. 

## 2011-09-25 LAB — GC/CHLAMYDIA PROBE AMP, GENITAL: Chlamydia, DNA Probe: NEGATIVE

## 2011-10-21 ENCOUNTER — Encounter: Payer: BC Managed Care – PPO | Admitting: Obstetrics and Gynecology

## 2011-10-29 ENCOUNTER — Telehealth: Payer: Self-pay | Admitting: Obstetrics and Gynecology

## 2011-10-29 NOTE — Telephone Encounter (Signed)
Tc to pt per vph recs. Pt needs to be seen for eval.  Appt sched 10-30-11 for ultrasound and follow up with sr. Pt voices understanding.

## 2011-10-29 NOTE — Telephone Encounter (Signed)
Tc to pt per telephone call. Pt c/o severe abd pain on 09/2911(Pain 10/10) in the middle of the night. C/o nausea with abd pain,no vomiting. No fever. Pt taking Ibuprofen with mild improvement. Pain today 5/10. Pt c/o irregular brown discharge on 10/26/11. Pt due to start menses this week(Lmp-end of May). Pt has unprotected ic approx 3 days after ending of cycle. Last upt-negative on 10/28/11. Pt not on any birth control. H/o ruptured ovarian cyst. Will consult with vph per recs.

## 2011-10-29 NOTE — Telephone Encounter (Signed)
Chandra/vph pt °

## 2011-10-30 ENCOUNTER — Other Ambulatory Visit: Payer: Self-pay | Admitting: Obstetrics and Gynecology

## 2011-10-30 ENCOUNTER — Ambulatory Visit (INDEPENDENT_AMBULATORY_CARE_PROVIDER_SITE_OTHER): Payer: BC Managed Care – PPO

## 2011-10-30 ENCOUNTER — Ambulatory Visit (INDEPENDENT_AMBULATORY_CARE_PROVIDER_SITE_OTHER): Payer: BC Managed Care – PPO | Admitting: Obstetrics and Gynecology

## 2011-10-30 ENCOUNTER — Encounter: Payer: Self-pay | Admitting: Obstetrics and Gynecology

## 2011-10-30 VITALS — BP 118/58 | Wt 200.0 lb

## 2011-10-30 DIAGNOSIS — N949 Unspecified condition associated with female genital organs and menstrual cycle: Secondary | ICD-10-CM

## 2011-10-30 DIAGNOSIS — R102 Pelvic and perineal pain: Secondary | ICD-10-CM

## 2011-10-30 DIAGNOSIS — N912 Amenorrhea, unspecified: Secondary | ICD-10-CM

## 2011-10-30 DIAGNOSIS — R109 Unspecified abdominal pain: Secondary | ICD-10-CM

## 2011-10-30 DIAGNOSIS — N83209 Unspecified ovarian cyst, unspecified side: Secondary | ICD-10-CM

## 2011-10-30 LAB — POCT URINALYSIS DIPSTICK
Blood, UA: NEGATIVE
Glucose, UA: NEGATIVE
Spec Grav, UA: 1.025
Urobilinogen, UA: NEGATIVE

## 2011-10-30 LAB — POCT URINE PREGNANCY: Preg Test, Ur: NEGATIVE

## 2011-10-30 MED ORDER — TRAMADOL HCL 50 MG PO TABS: 50.0000 mg | ORAL_TABLET | Freq: Four times a day (QID) | ORAL | Status: AC | PRN

## 2011-10-30 MED ORDER — NORETHINDRONE ACET-ETHINYL EST 1-20 MG-MCG PO TABS: 1.0000 | ORAL_TABLET | Freq: Every day | ORAL | Status: DC

## 2011-10-30 NOTE — Progress Notes (Signed)
Contraception: OCP (estrogen/progesterone) History of STD:  no history of PID, STD's History of ovarian cyst: yes, ruptured ovarian cyst recently  History of fibroids: no History of endometriosis:no Previous ultrasound:yes, this morning   Urinary symptoms: nausea Gastro-intestinal symptoms:  Constipation: no     Diarrhea: yes     Nausea: yes     Vomiting: yes     Fever: no Vaginal discharge: color yellow Pt also c/o spotting.  Subjective:  21 yo seen at [facility] for ruptured ovarian cyst with hemoperitoneum 09/05/11 This weekend had spotting.Pt did not follow instructions from last visit on BCP to help on DUB. UPT negative 2 days ago.   Objective:  Ultrasound today: uterus and both ovaries normal  A/P:  Resolved ovarian cyst DUB 2nd inconsistent use of BCP Await cycle, start BCP on 1st Sunday, BUM for 1 month

## 2011-11-07 ENCOUNTER — Encounter: Payer: BC Managed Care – PPO | Admitting: Obstetrics and Gynecology

## 2012-01-06 ENCOUNTER — Other Ambulatory Visit: Payer: Self-pay | Admitting: Obstetrics and Gynecology

## 2012-01-06 NOTE — Telephone Encounter (Signed)
Ok to refill Ultram 50 mg every 4-6 hours PRN  #30  0 RF

## 2012-01-06 NOTE — Telephone Encounter (Signed)
sr pt 

## 2012-01-06 NOTE — Telephone Encounter (Signed)
DR. Brayton Mars REQ.

## 2012-01-07 MED ORDER — TRAMADOL HCL 50 MG PO TABS: 50.0000 mg | ORAL_TABLET | Freq: Four times a day (QID) | ORAL | Status: AC | PRN

## 2012-01-07 NOTE — Telephone Encounter (Signed)
Spoke with pt informed rx sent to pharm pt voice understading 

## 2012-01-15 ENCOUNTER — Encounter: Payer: Self-pay | Admitting: Obstetrics and Gynecology

## 2012-01-15 ENCOUNTER — Ambulatory Visit (INDEPENDENT_AMBULATORY_CARE_PROVIDER_SITE_OTHER): Payer: BC Managed Care – PPO | Admitting: Obstetrics and Gynecology

## 2012-01-15 VITALS — BP 110/78 | Wt 198.0 lb

## 2012-01-15 DIAGNOSIS — R102 Pelvic and perineal pain: Secondary | ICD-10-CM

## 2012-01-15 DIAGNOSIS — R109 Unspecified abdominal pain: Secondary | ICD-10-CM

## 2012-01-15 LAB — POCT URINALYSIS DIPSTICK
Bilirubin, UA: 1
Blood, UA: 3
Ketones, UA: NEGATIVE
Spec Grav, UA: 1.02
pH, UA: 5

## 2012-01-15 MED ORDER — IBUPROFEN 600 MG PO TABS: 600.0000 mg | ORAL_TABLET | Freq: Four times a day (QID) | ORAL | Status: DC | PRN

## 2012-01-15 NOTE — Progress Notes (Signed)
Contraception: none History of STD:  no history of PID, STD's History of ovarian cyst: yes:  09/2011 History of fibroids: no History of endometriosis:no Previous ultrasound:no  Urinary symptoms: none Gastro-intestinal symptoms:  Constipation: no     Diarrhea: no     Nausea: yes     Vomiting: no     Fever: no Vaginal discharge: no vaginal discharge Pt want full std screening today.  Subjective:     Danielle Evans is a 21 y.o. female G0P0 who presents with complaints of abdominal pain since last week. Feels like a stab. Pain is persistent, 7-8/10. Every month, 1 week before cycle and lasts all through the cycle.Low pelvic with radiation to back and legs. Improved with Ultram.  Associated symptoms: nausea Family history: non contributory .  Pertinent gyn history:   Menses: regular every month without intermenstrual spotting  PMH: Past Medical History  Diagnosis Date  . Bipolar 1 disorder   . Attention and concentration deficit      Medication: (Not in a hospital admission) Allergies: Allergies  Allergen Reactions  . Ceclor (Cefaclor)       Review of systems:   Pertinent items are noted in HPI.  Objective:    BP 110/78  Wt 198 lb (89.812 kg)  LMP 01/14/2012  Weight: Wt Readings from Last 1 Encounters:  01/15/12 198 lb (89.812 kg)    BMI: There is no height on file to calculate BMI.  General Appearance: Alert, appropriate appearance for age. No acute distress HEENT: Grossly normal Neck / Thyroid: Supple, no masses, nodes or enlargement Lungs: clear to auscultation bilaterally Back: No CVA tenderness Cardiovascular: Regular rate and rhythm. S1, S2, no murmur Gastrointestinal: Soft, non-tender, no masses or organomegaly Pelvic Exam: Vulva and vagina appear normal. Bimanual exam reveals normal uterus and adnexa. Rectovaginal: not indicated       Assessment and Plan:    Pelvic ultrasound. for pelvic pain of unknown etiology STD screen Change back to  Nuvaring per pt preference: 2 samples given  Lanna Poche  MD 9/18/20133:11 PM

## 2012-01-16 LAB — CBC
HCT: 40.5 % (ref 36.0–46.0)
Hemoglobin: 13.4 g/dL (ref 12.0–15.0)
MCH: 29.6 pg (ref 26.0–34.0)
MCV: 89.4 fL (ref 78.0–100.0)
RBC: 4.53 MIL/uL (ref 3.87–5.11)

## 2012-01-16 LAB — HSV 2 ANTIBODY, IGG: HSV 2 Glycoprotein G Ab, IgG: 12.13 IV — ABNORMAL HIGH

## 2012-01-16 LAB — RPR

## 2012-01-16 LAB — HEPATITIS C ANTIBODY: HCV Ab: NEGATIVE

## 2012-01-20 ENCOUNTER — Telehealth: Payer: Self-pay

## 2012-01-20 ENCOUNTER — Other Ambulatory Visit: Payer: Self-pay

## 2012-01-20 ENCOUNTER — Other Ambulatory Visit (INDEPENDENT_AMBULATORY_CARE_PROVIDER_SITE_OTHER): Payer: BC Managed Care – PPO

## 2012-01-20 MED ORDER — AZITHROMYCIN 500 MG PO TABS: ORAL_TABLET | ORAL | Status: DC

## 2012-01-20 MED ORDER — CEFTRIAXONE SODIUM 250 MG IJ SOLR: 250.0000 mg | Freq: Once | INTRAMUSCULAR | Status: DC

## 2012-01-20 NOTE — Progress Notes (Signed)
Quick Note:  Positive Chlamydia and indeterminate GC: please call pt to inform and provide with Zithromax 2 g and Rocephin 250 mg IM. STD protocol. TOC 3 weeks ______

## 2012-01-20 NOTE — Telephone Encounter (Signed)
Message copied by Larwance Rote on Mon Jan 20, 2012  2:35 PM ------      Message from: Silverio Lay      Created: Mon Jan 20, 2012  1:42 PM       Positive Chlamydia and indeterminate GC: please call pt to inform and provide with Zithromax 2 g and Rocephin 250 mg IM. STD protocol. TOC 3 weeks

## 2012-01-20 NOTE — Telephone Encounter (Signed)
Message copied by Larwance Rote on Mon Jan 20, 2012  2:31 PM ------      Message from: Silverio Lay      Created: Mon Jan 20, 2012  1:42 PM       Positive Chlamydia and indeterminate GC: please call pt to inform and provide with Zithromax 2 g and Rocephin 250 mg IM. STD protocol. TOC 3 weeks

## 2012-01-20 NOTE — Telephone Encounter (Signed)
Pt in for injection

## 2012-01-20 NOTE — Telephone Encounter (Signed)
Pt coming in at 3:00 for injection of Rocephin 250mg .  Rx sent to pharmacy.  ld

## 2012-01-22 DIAGNOSIS — A749 Chlamydial infection, unspecified: Secondary | ICD-10-CM

## 2012-01-22 MED ORDER — CEFTRIAXONE SODIUM 1 G IJ SOLR
250.0000 mg | Freq: Once | INTRAMUSCULAR | Status: AC
  Administered 2012-01-22: 250 mg via INTRAMUSCULAR

## 2012-01-24 ENCOUNTER — Other Ambulatory Visit: Payer: BC Managed Care – PPO

## 2012-01-24 ENCOUNTER — Encounter: Payer: BC Managed Care – PPO | Admitting: Obstetrics and Gynecology

## 2012-02-13 ENCOUNTER — Ambulatory Visit (INDEPENDENT_AMBULATORY_CARE_PROVIDER_SITE_OTHER): Payer: BC Managed Care – PPO

## 2012-02-13 ENCOUNTER — Encounter: Payer: Self-pay | Admitting: Obstetrics and Gynecology

## 2012-02-13 ENCOUNTER — Other Ambulatory Visit: Payer: Self-pay | Admitting: Obstetrics and Gynecology

## 2012-02-13 ENCOUNTER — Ambulatory Visit (INDEPENDENT_AMBULATORY_CARE_PROVIDER_SITE_OTHER): Payer: BC Managed Care – PPO | Admitting: Obstetrics and Gynecology

## 2012-02-13 VITALS — BP 106/70 | HR 74 | Wt 194.0 lb

## 2012-02-13 DIAGNOSIS — R102 Pelvic and perineal pain: Secondary | ICD-10-CM

## 2012-02-13 DIAGNOSIS — A749 Chlamydial infection, unspecified: Secondary | ICD-10-CM

## 2012-02-13 DIAGNOSIS — N949 Unspecified condition associated with female genital organs and menstrual cycle: Secondary | ICD-10-CM

## 2012-02-13 DIAGNOSIS — N83209 Unspecified ovarian cyst, unspecified side: Secondary | ICD-10-CM

## 2012-02-13 DIAGNOSIS — A5400 Gonococcal infection of lower genitourinary tract, unspecified: Secondary | ICD-10-CM

## 2012-02-13 DIAGNOSIS — A549 Gonococcal infection, unspecified: Secondary | ICD-10-CM

## 2012-02-13 DIAGNOSIS — R109 Unspecified abdominal pain: Secondary | ICD-10-CM

## 2012-02-13 MED ORDER — ETONOGESTREL-ETHINYL ESTRADIOL 0.12-0.015 MG/24HR VA RING: VAGINAL_RING | VAGINAL | Status: DC

## 2012-02-13 NOTE — Progress Notes (Signed)
Subjective:    Danielle Evans is a 21 y.o. female, G0P0, who presents for Gyn ultrasound because of Pelvic pain and TOC for Chlamydia/GC  The following portions of the patient's history were reviewed and updated as appropriate: allergies, current medications, past family history.  Objective:    BP 106/70  Pulse 74  Wt 194 lb (87.998 kg)  LMP 01/19/2012   Wt Readings from Last 1 Encounters:  02/13/12 194 lb (87.998 kg)          BMI: There is no height on file to calculate BMI.  ULTRASOUND: Uterus AV    Adnexa normal    Endometrium 9.36 mm    Free fluid: no    Other findings:  No  GYN: GC/Chlamydia repeated   Assessment:    Cyclic pelvic pain with normal ultrasound: suspect endometriosis  Recent GC/Chlamydia   Plan:    Info on endometriosis given. Trial of continuous Nuvaring and follow-up in 4 months +/- LSC TOC collected today  Silverio Lay MD

## 2012-04-02 ENCOUNTER — Telehealth: Payer: Self-pay | Admitting: Obstetrics and Gynecology

## 2012-04-02 NOTE — Telephone Encounter (Signed)
Spoke with pt rgd msg pt c/o pelvic pain hx of cyst pt wants eval with vph or sr pt has appt 04/07/12 with vph pt voice understanding

## 2012-04-07 ENCOUNTER — Encounter: Payer: Self-pay | Admitting: Obstetrics and Gynecology

## 2012-04-07 ENCOUNTER — Other Ambulatory Visit: Payer: Self-pay | Admitting: Obstetrics and Gynecology

## 2012-04-07 ENCOUNTER — Ambulatory Visit (INDEPENDENT_AMBULATORY_CARE_PROVIDER_SITE_OTHER): Payer: BC Managed Care – PPO | Admitting: Obstetrics and Gynecology

## 2012-04-07 VITALS — BP 118/62 | Temp 98.3°F | Wt 197.0 lb

## 2012-04-07 DIAGNOSIS — N949 Unspecified condition associated with female genital organs and menstrual cycle: Secondary | ICD-10-CM

## 2012-04-07 MED ORDER — KETOROLAC TROMETHAMINE 10 MG PO TABS: 10.0000 mg | ORAL_TABLET | Freq: Four times a day (QID) | ORAL | Status: DC | PRN

## 2012-04-07 NOTE — Progress Notes (Signed)
Subjective:  Vag. Discharge: no Odor: no Fever:no Irreg.Periods:no Dyspareunia:yes Dysuria:no Frequency:no Urgency:no Hematuria:no Kidney stones:no Constipation:no Diarrhea:no Rectal Bleeding: no Vomiting:no Nausea:no Pregnant:no Fibroids:no Endometriosis:no Hx of Ovarian Cyst:yes Hx IUD:no Hx STD-PID:yes Appendectomy: no Gall Bladder Dz:no   Danielle Evans is a 21 y.o. female G0P0 who presents with complaints of chronic pelvic pain and abdominal/pelvic pain.   She has been followed for hx of ovarian cyst and pelvic pain 2 mos ago she was seen by Dr. Estanislado Pandy who started on continuous Nuvaring.  Pt has had only a small amount of break through bleeding, and recently had an increase in back pain for which she has used no medication.  No GI sx.  No urinary sx.    .  Pertinent gyn history:   Menses: None since beginning continuous Nuvaring  PMH: Past Medical History  Diagnosis Date  . Bipolar 1 disorder   . Attention and concentration deficit   . Exposure to STD      Allergies: Allergies  Allergen Reactions  . Ceclor (Cefaclor)       Review of systems:  As above  Objective:    BP 118/62  Temp 98.3 F (36.8 C) (Oral)  Wt 197 lb (89.359 kg)  LMP 01/07/2012  Weight: Wt Readings from Last 1 Encounters:  04/07/12 197 lb (89.359 kg)    BMI: There is no height on file to calculate BMI.  General Appearance: Alert, appropriate appearance for age. No acute distress Back: No CVA tenderness Gastrointestinal: Soft, non-tender, no masses or organomegaly Pelvic Exam: External genitalia: normal general appearance Vaginal: normal mucosa without prolapse or lesions Cervix: normal appearance and no lateral motion tenderness Adnexa: no masses or tenderness Uterus: normal single, nontender Rectovaginal: not indicated       Assessment and Plan:   Intermittent pelvic pain with recent onset of continuous hormonal regimen All questions answered. Contraception: NuvaRing  vaginal inserts. Diagnosis explained in detail, including differential. Options for L/S or continuing current regimen reviewed.  She wants to continue current regimen Toradol for pain Follow-up: 4 mos or prn  Dierdre Forth MD 12/10/20135:53 PM

## 2012-04-29 DIAGNOSIS — R87629 Unspecified abnormal cytological findings in specimens from vagina: Secondary | ICD-10-CM

## 2012-04-29 DIAGNOSIS — R87619 Unspecified abnormal cytological findings in specimens from cervix uteri: Secondary | ICD-10-CM

## 2012-04-29 HISTORY — DX: Unspecified abnormal cytological findings in specimens from vagina: R87.629

## 2012-04-29 HISTORY — DX: Unspecified abnormal cytological findings in specimens from cervix uteri: R87.619

## 2012-05-06 ENCOUNTER — Other Ambulatory Visit: Payer: Self-pay | Admitting: Obstetrics and Gynecology

## 2012-05-06 NOTE — Telephone Encounter (Signed)
Spoke with pt rgd msg informed rx sent to pharm with 1 yr refill call pharm to get refill on rx pt voice understanding

## 2012-05-20 ENCOUNTER — Telehealth: Payer: Self-pay | Admitting: Obstetrics and Gynecology

## 2012-05-20 NOTE — Telephone Encounter (Signed)
Tc from pt. Pt c/o nausea when she takes Toradol medication. Pain 5/10. No fever. Pt req an alternate pain medication with an anti-emetic medication. Pt states,"Toradol helps some with pelvic pain". Pt due for follow up of pelvic pain 4/204. Will consult with AR per recs. Pt opts not to come into office for eval if rec at this time.

## 2012-05-20 NOTE — Telephone Encounter (Signed)
Spoke to pt tonight to let her know that Dr. Su Hilt said that we could call in Tramadol 50 mg sig 1po q 6 hrs prn for pain # 20 w/ No RF's. Med called to pt's preferred pharmacy. Danielle Evans A

## 2012-05-20 NOTE — Telephone Encounter (Signed)
Tc to pt per AR recs of trying Ibuprofen 800mg ,otherwise pt may have Phenergan 12.5 mg to take with Toradol prn per AR. Pt states,"just took another Toradol and it caused vomiting this time". Pt opts to have a rx for Ultram bc pt has taken it in the past w/o complication. Will consult with provider and cb with recs. Pt agrees.

## 2012-07-15 ENCOUNTER — Other Ambulatory Visit: Payer: Self-pay | Admitting: Obstetrics and Gynecology

## 2012-07-16 LAB — HEPATITIS B SURFACE ANTIGEN: Hepatitis B Surface Ag: NEGATIVE

## 2015-08-23 ENCOUNTER — Ambulatory Visit (HOSPITAL_COMMUNITY)
Admission: EM | Admit: 2015-08-23 | Discharge: 2015-08-23 | Disposition: A | Discharge status: Home / Self Care | Payer: BLUE CROSS/BLUE SHIELD | Attending: Emergency Medicine | Admitting: Emergency Medicine

## 2015-08-23 ENCOUNTER — Encounter (HOSPITAL_COMMUNITY): Payer: Self-pay | Admitting: Emergency Medicine

## 2015-08-23 DIAGNOSIS — H6591 Unspecified nonsuppurative otitis media, right ear: Secondary | ICD-10-CM

## 2015-08-23 DIAGNOSIS — H9203 Otalgia, bilateral: Secondary | ICD-10-CM

## 2015-08-23 MED ORDER — HYDROCODONE-ACETAMINOPHEN 5-325 MG PO TABS: 1.0000 | ORAL_TABLET | ORAL | Status: DC | PRN

## 2015-08-23 NOTE — ED Provider Notes (Signed)
CSN: [id number]     Arrival date & time 08/23/15  1600 History   First MD Initiated Contact with Patient 08/23/15 1710     Chief Complaint  Patient presents with  . Ear Problem    bilateral   (Consider location/radiation/quality/duration/timing/severity/associated sxs/prior Treatment) HPI Comments:  25 year old female with a history of sinus and TM pathology presents today with bilateral ear aches. This has been going on for a few months but worse in the past week especially the right ear.She saw her PCP a little over a week ago and was prescribed Augmentin and Norco for pain. She states the pain has increased in the right ear and her PCP advised her to go to the hospital. Patient chose to come to the urgent care instead. Patient had myringotomy with tube sin the recent past. The left tube is intact the right to has lodged how.she has also had surgery on her sinuses. She is compliant with her all Augmentin and has nearly finished the course.   Past Medical History  Diagnosis Date  . Bipolar 1 disorder ([place])   . Attention and concentration deficit   . Exposure to STD    Past Surgical History  Procedure Laterality Date  . Tonsilectomy, adenoidectomy, bilateral myringotomy and tubes     Family History  Problem Relation Age of Onset  . Heart disease Father   . Cancer Paternal Aunt     breast   . Cancer Maternal Grandmother     breast  . Parkinsonism Maternal Grandfather    Social History  Substance Use Topics  . Smoking status: Former Smoker -- 3 years    Quit date: 10/23/2011  . Smokeless tobacco: Never Used  . Alcohol Use: 1.5 oz/week    3 drink(s) per week   OB History    Gravida Para Term Preterm AB TAB SAB Ectopic Multiple Living   0              Review of Systems  Constitutional: Negative for fever, activity change and fatigue.  HENT: Positive for ear discharge and ear pain. Negative for congestion, facial swelling and sore throat.   Respiratory: Negative.   Skin:  Negative.   Neurological: Negative.   All other systems reviewed and are negative.   Allergies  Ceclor  Home Medications   Prior to Admission medications   Medication Sig Start Date End Date Taking? Authorizing Provider  amoxicillin-clavulanate (AUGMENTIN) 875-125 MG tablet Take 1 tablet by mouth 2 (two) times daily.   Yes Historical Provider, MD  clonazePAM (KLONOPIN) 0.5 MG tablet Take 0.5 mg by mouth 2 (two) times daily as needed for anxiety.   Yes Historical Provider, MD  amphetamine-dextroamphetamine (ADDERALL XR) 30 MG 24 hr capsule Take 30 mg by mouth every morning.    Historical Provider, MD  baclofen (LIORESAL) 10 MG tablet Take 10 mg by mouth as needed for muscle spasms.    Historical Provider, MD  HYDROcodone-acetaminophen (NORCO/VICODIN) 5-325 MG tablet Take 1 tablet by mouth every 4 (four) hours as needed. 08/23/15   Janne Napoleon, NP  ibuprofen (ADVIL,MOTRIN) 600 MG tablet Take 1 tablet (600 mg total) by mouth every 6 (six) hours as needed for pain. 01/15/12   Delsa Bern, MD  lamoTRIgine (LAMICTAL) 25 MG tablet Take 50 mg by mouth daily.    Historical Provider, MD  norethindrone-ethinyl estradiol (LOESTRIN 1/20, 21,) 1-20 MG-MCG tablet Take 1 tablet by mouth daily. 10/30/11 10/29/12  Delsa Bern, MD   Meds Ordered and Administered this  Visit  Medications - No data to display  BP 142/81 mmHg  Pulse 78  Temp(Src) 98.5 F (36.9 C) (Oral)  SpO2 98% No data found.   Physical Exam  Constitutional: She is oriented to person, place, and time. She appears well-developed and well-nourished. No distress.  HENT:  Head: Normocephalic and atraumatic.  Left TM is pearly gray, mostly transparent with some scarring from surgery. The tube is intact. No drainage. No erythema.  Right TM is distorted and scarred.There is some bulging to the posterior aspect. The tube has been dislodged and is no longer draining fluid. There is no erythema. There is a small amount of dried drainage along  the inferior, dependent portion of the ear canal. No bleeding. No purulence. No swelling erythema or moisture of the ear canal.  Eyes: Conjunctivae and EOM are normal.  Neck: Normal range of motion. Neck supple.  Cardiovascular: Normal rate.   Pulmonary/Chest: Effort normal.  Musculoskeletal: Normal range of motion.  Lymphadenopathy:    She has no cervical adenopathy.  Neurological: She is alert and oriented to person, place, and time. She exhibits normal muscle tone.  Skin: Skin is warm and dry.  Nursing note and vitals reviewed.   ED Course  Procedures (including critical care time)  Labs Review Labs Reviewed - No data to display  Imaging Review No results found.   Visual Acuity Review  Right Eye Distance:   Left Eye Distance:   Bilateral Distance:    Right Eye Near:   Left Eye Near:    Bilateral Near:         MDM   1. Otalgia of both ears   2. Middle ear effusion, right    Meds ordered this encounter  Medications  . clonazePAM (KLONOPIN) 0.5 MG tablet    Sig: Take 0.5 mg by mouth 2 (two) times daily as needed for anxiety.  Marland Kitchen amoxicillin-clavulanate (AUGMENTIN) 875-125 MG tablet    Sig: Take 1 tablet by mouth 2 (two) times daily.  . baclofen (LIORESAL) 10 MG tablet    Sig: Take 10 mg by mouth as needed for muscle spasms.  Marland Kitchen HYDROcodone-acetaminophen (NORCO/VICODIN) 5-325 MG tablet    Sig: Take 1 tablet by mouth every 4 (four) hours as needed.    Dispense:  15 tablet    Refill:  0    Order Specific Question:  Supervising Provider    Answer:  Melony Overly [id number]    Norco only rx'd See ENT ASAP    Janne Napoleon, NP 08/23/15 1803

## 2015-08-23 NOTE — ED Notes (Signed)
Pt has a history of ear problems.  Pt had surgery on her sinuses and turbinates in January to relieve pressure on her ears.  She started having a lot of pain bilaterally, more in the right starting last week.  Her PCP told her that her right tube is out place.  Her ENT cannot see her until May but she is in too much pain to wait that long.  Last night she had some bleeding from the right ear.

## 2015-08-23 NOTE — Discharge Instructions (Signed)
Serous Otitis Media Serous otitis media is fluid in the middle ear space. This space contains the bones for hearing and air. Air in the middle ear space helps to transmit sound.  The air gets there through the eustachian tube. This tube goes from the back of the nose (nasopharynx) to the middle ear space. It keeps the pressure in the middle ear the same as the outside world. It also helps to drain fluid from the middle ear space. CAUSES  Serous otitis media occurs when the eustachian tube gets blocked. Blockage can come from:  Ear infections.  Colds and other upper respiratory infections.  Allergies.  Irritants such as cigarette smoke.  Sudden changes in air pressure (such as descending in an airplane).  Enlarged adenoids.  A mass in the nasopharynx. During colds and upper respiratory infections, the middle ear space can become temporarily filled with fluid. This can happen after an ear infection also. Once the infection clears, the fluid will generally drain out of the ear through the eustachian tube. If it does not, then serous otitis media occurs. SIGNS AND SYMPTOMS   Hearing loss.  A feeling of fullness in the ear, without pain.  Young children may not show any symptoms but may show slight behavioral changes, such as agitation, ear pulling, or crying. DIAGNOSIS  Serous otitis media is diagnosed by an ear exam. Tests may be done to check on the movement of the eardrum. Hearing exams may also be done. TREATMENT  The fluid most often goes away without treatment. If allergy is the cause, allergy treatment may be helpful. Fluid that persists for several months may require minor surgery. A small tube is placed in the eardrum to:  Drain the fluid.  Restore the air in the middle ear space. In certain situations, antibiotic medicines are used to avoid surgery. Surgery may be done to remove enlarged adenoids (if this is the cause). HOME CARE INSTRUCTIONS   Keep children away from  tobacco smoke.  Keep all follow-up visits as directed by your health care provider. SEEK MEDICAL CARE IF:   Your hearing is not better in 3 months.  Your hearing is worse.  You have ear pain.  You have drainage from the ear.  You have dizziness.  You have serous otitis media only in one ear or have any bleeding from your nose (epistaxis).  You notice a lump on your neck. MAKE SURE YOU:  Understand these instructions.   Will watch your condition.   Will get help right away if you are not doing well or get worse.    This information is not intended to replace advice given to you by your health care provider. Make sure you discuss any questions you have with your health care provider.   Document Released: 07/06/2003 Document Revised: 05/06/2014 Document Reviewed: 11/10/2012 Elsevier Interactive Patient Education 2016 [place] An earache, also called otalgia, can be caused by many things. Pain from an earache can be sharp, dull, or burning. The pain may be temporary or constant. Earaches can be caused by problems with the ear, such as infection in either the middle ear or the ear canal, injury, impacted ear wax, middle ear pressure, or a foreign body in the ear. Ear pain can also result from problems in other areas. This is called referred pain. For example, pain can come from a sore throat, a tooth infection, or problems with the jaw or the joint between the jaw and the skull (temporomandibular joint,  or TMJ). The cause of an earache is not always easy to identify. Watchful waiting may be appropriate for some earaches until a clear cause of the pain can be found. HOME CARE INSTRUCTIONS Watch your condition for any changes. The following actions may help to lessen any discomfort that you are feeling:  Take medicines only as directed by your health care provider. This includes ear drops.  Apply ice to your outer ear to help reduce pain.  Put ice in a plastic  bag.  Place a towel between your skin and the bag.  Leave the ice on for 20 minutes, 2-3 times per day.  Do not put anything in your ear other than medicine that is prescribed by your health care provider.  Try resting in an upright position instead of lying down. This may help to reduce pressure in the middle ear and relieve pain.  Chew gum if it helps to relieve your ear pain.  Control any allergies that you have.  Keep all follow-up visits as directed by your health care provider. This is important. SEEK MEDICAL CARE IF:  Your pain does not improve within 2 days.  You have a fever.  You have new or worsening symptoms. SEEK IMMEDIATE MEDICAL CARE IF:  You have a severe headache.  You have a stiff neck.  You have difficulty swallowing.  You have redness or swelling behind your ear.  You have drainage from your ear.  You have hearing loss.  You feel dizzy.   This information is not intended to replace advice given to you by your health care provider. Make sure you discuss any questions you have with your health care provider.   Document Released: 12/01/2003 Document Revised: 05/06/2014 Document Reviewed: 11/14/2013 Elsevier Interactive Patient Education [employer].

## 2015-09-24 ENCOUNTER — Encounter (HOSPITAL_COMMUNITY): Payer: Self-pay | Admitting: *Deleted

## 2015-09-24 ENCOUNTER — Inpatient Hospital Stay (HOSPITAL_COMMUNITY)
Admission: AD | Admit: 2015-09-24 | Discharge: 2015-09-24 | Disposition: A | Discharge status: Home / Self Care | Payer: BLUE CROSS/BLUE SHIELD | Source: Ambulatory Visit | Attending: Family Medicine | Admitting: Family Medicine

## 2015-09-24 DIAGNOSIS — Z881 Allergy status to other antibiotic agents status: Secondary | ICD-10-CM | POA: Insufficient documentation

## 2015-09-24 DIAGNOSIS — R109 Unspecified abdominal pain: Secondary | ICD-10-CM | POA: Diagnosis present

## 2015-09-24 DIAGNOSIS — R102 Pelvic and perineal pain: Secondary | ICD-10-CM | POA: Diagnosis not present

## 2015-09-24 DIAGNOSIS — R110 Nausea: Secondary | ICD-10-CM | POA: Insufficient documentation

## 2015-09-24 DIAGNOSIS — E860 Dehydration: Secondary | ICD-10-CM | POA: Diagnosis not present

## 2015-09-24 DIAGNOSIS — Z8249 Family history of ischemic heart disease and other diseases of the circulatory system: Secondary | ICD-10-CM | POA: Insufficient documentation

## 2015-09-24 DIAGNOSIS — Z87891 Personal history of nicotine dependence: Secondary | ICD-10-CM | POA: Insufficient documentation

## 2015-09-24 HISTORY — DX: Malignant neoplasm of bone and articular cartilage, unspecified: C41.9

## 2015-09-24 LAB — CBC WITH DIFFERENTIAL/PLATELET
BASOS PCT: 0 %
Basophils Absolute: 0 10*3/uL (ref 0.0–0.1)
Eosinophils Absolute: 0.1 10*3/uL (ref 0.0–0.7)
Eosinophils Relative: 1 %
HEMATOCRIT: 42.4 % (ref 36.0–46.0)
Hemoglobin: 14.5 g/dL (ref 12.0–15.0)
Lymphocytes Relative: 31 %
Lymphs Abs: 2.7 10*3/uL (ref 0.7–4.0)
MCH: 31 pg (ref 26.0–34.0)
MCHC: 34.2 g/dL (ref 30.0–36.0)
MCV: 90.6 fL (ref 78.0–100.0)
MONO ABS: 0.3 10*3/uL (ref 0.1–1.0)
MONOS PCT: 4 %
NEUTROS ABS: 5.6 10*3/uL (ref 1.7–7.7)
Neutrophils Relative %: 64 %
Platelets: 308 10*3/uL (ref 150–400)
RBC: 4.68 MIL/uL (ref 3.87–5.11)
RDW: 12.8 % (ref 11.5–15.5)
WBC: 8.8 10*3/uL (ref 4.0–10.5)

## 2015-09-24 LAB — WET PREP, GENITAL
CLUE CELLS WET PREP: NONE SEEN
Sperm: NONE SEEN
Trich, Wet Prep: NONE SEEN
YEAST WET PREP: NONE SEEN

## 2015-09-24 LAB — URINALYSIS, ROUTINE W REFLEX MICROSCOPIC
BILIRUBIN URINE: NEGATIVE
GLUCOSE, UA: NEGATIVE mg/dL
KETONES UR: NEGATIVE mg/dL
Leukocytes, UA: NEGATIVE
Nitrite: NEGATIVE
PH: 6 (ref 5.0–8.0)
Protein, ur: NEGATIVE mg/dL
Specific Gravity, Urine: >1.03 — ABNORMAL HIGH (ref 1.005–1.030)

## 2015-09-24 LAB — URINE MICROSCOPIC-ADD ON: WBC UA: NONE SEEN WBC/hpf (ref 0–5)

## 2015-09-24 LAB — POCT PREGNANCY, URINE: Preg Test, Ur: NEGATIVE

## 2015-09-24 MED ORDER — OXYCODONE-ACETAMINOPHEN 5-325 MG PO TABS: 1.0000 | ORAL_TABLET | Freq: Four times a day (QID) | ORAL | Status: DC | PRN

## 2015-09-24 MED ORDER — OXYCODONE-ACETAMINOPHEN 5-325 MG PO TABS
2.0000 | ORAL_TABLET | Freq: Once | ORAL | Status: AC
  Administered 2015-09-24: 2 via ORAL
  Filled 2015-09-24: qty 2

## 2015-09-24 NOTE — Discharge Instructions (Signed)
Levonorgestrel intrauterine device (IUD) What is this medicine? LEVONORGESTREL IUD (LEE voe nor jes trel) is a contraceptive (birth control) device. The device is placed inside the uterus by a healthcare professional. It is used to prevent pregnancy and can also be used to treat heavy bleeding that occurs during your period. Depending on the device, it can be used for 3 to 5 years. This medicine may be used for other purposes; ask your health care provider or pharmacist if you have questions. What should I tell my health care provider before I take this medicine? They need to know if you have any of these conditions: -abnormal Pap smear -cancer of the breast, uterus, or cervix -diabetes -endometritis -genital or pelvic infection now or in the past -have more than one sexual partner or your partner has more than one partner -heart disease -history of an ectopic or tubal pregnancy -immune system problems -IUD in place -liver disease or tumor -problems with blood clots or take blood-thinners -use intravenous drugs -uterus of unusual shape -vaginal bleeding that has not been explained -an unusual or allergic reaction to levonorgestrel, other hormones, silicone, or polyethylene, medicines, foods, dyes, or preservatives -pregnant or trying to get pregnant -breast-feeding How should I use this medicine? This device is placed inside the uterus by a health care professional. Talk to your pediatrician regarding the use of this medicine in children. Special care may be needed. Overdosage: If you think you have taken too much of this medicine contact a poison control center or emergency room at once. NOTE: This medicine is only for you. Do not share this medicine with others. What if I miss a dose? This does not apply. What may interact with this medicine? Do not take this medicine with any of the following medications: -amprenavir -bosentan -fosamprenavir This medicine may also interact with  the following medications: -aprepitant -barbiturate medicines for inducing sleep or treating seizures -bexarotene -griseofulvin -medicines to treat seizures like carbamazepine, ethotoin, felbamate, oxcarbazepine, phenytoin, topiramate -modafinil -pioglitazone -rifabutin -rifampin -rifapentine -some medicines to treat HIV infection like atazanavir, indinavir, lopinavir, nelfinavir, tipranavir, ritonavir -St. John's wort -warfarin This list may not describe all possible interactions. Give your health care provider a list of all the medicines, herbs, non-prescription drugs, or dietary supplements you use. Also tell them if you smoke, drink alcohol, or use illegal drugs. Some items may interact with your medicine. What should I watch for while using this medicine? Visit your doctor or health care professional for regular check ups. See your doctor if you or your partner has sexual contact with others, becomes HIV positive, or gets a sexual transmitted disease. This product does not protect you against HIV infection (AIDS) or other sexually transmitted diseases. You can check the placement of the IUD yourself by reaching up to the top of your vagina with clean fingers to feel the threads. Do not pull on the threads. It is a good habit to check placement after each menstrual period. Call your doctor right away if you feel more of the IUD than just the threads or if you cannot feel the threads at all. The IUD may come out by itself. You may become pregnant if the device comes out. If you notice that the IUD has come out use a backup birth control method like condoms and call your health care provider. Using tampons will not change the position of the IUD and are okay to use during your period. What side effects may I notice from receiving this medicine?   Side effects that you should report to your doctor or health care professional as soon as possible: -allergic reactions like skin rash, itching or  hives, swelling of the face, lips, or tongue -fever, flu-like symptoms -genital sores -high blood pressure -no menstrual period for 6 weeks during use -pain, swelling, warmth in the leg -pelvic pain or tenderness -severe or sudden headache -signs of pregnancy -stomach cramping -sudden shortness of breath -trouble with balance, talking, or walking -unusual vaginal bleeding, discharge -yellowing of the eyes or skin Side effects that usually do not require medical attention (report to your doctor or health care professional if they continue or are bothersome): -acne -breast pain -change in sex drive or performance -changes in weight -cramping, dizziness, or faintness while the device is being inserted -headache -irregular menstrual bleeding within first 3 to 6 months of use -nausea This list may not describe all possible side effects. Call your doctor for medical advice about side effects. You may report side effects to FDA at 1-800-FDA-1088. Where should I keep my medicine? This does not apply. NOTE: This sheet is a summary. It may not cover all possible information. If you have questions about this medicine, talk to your doctor, pharmacist, or health care provider.    2016, Elsevier/Gold Standard. (2011-05-16 13:54:04)  

## 2015-09-24 NOTE — MAU Note (Signed)
Pt reports having a sharp crampy pain in her RLQ for a few months. Pain has gotten worse the past few days. Has had an ovarian cyst before and pt states the pain is similar.

## 2015-09-24 NOTE — MAU Provider Note (Signed)
History     CSN: [id number]  Arrival date and time: 09/24/15 2036   First Provider Initiated Contact with Patient 09/24/15 2103      Chief Complaint  Patient presents with  . Abdominal Pain   HPI [name] is a 25 y.o. [id number] who presents to MAU today with complaint of lower abdominal/pelvic pain x 2 weeks. She states pain has been constant, but worse today. She has had some spotting and thick, white discharge. She denies itching, odor or irritation. She has Mirena in place since August 2016. She has had some mild nausea occasionally, but denies vomiting, diarrhea, constipation, UTI symptoms or fever. She states that she has also had bilateral diffuse breast tenderness lately. She has a GYN at [facility] but is unable to travel there consistently for appointments. She rates her pain at 7/10 now and took 800 mg Ibuprofen this afternoon. Patient states last time she had similar pain she had an ovarian cyst. The cyst resolved without intervention.   OB History    Gravida Para Term Preterm AB TAB SAB Ectopic Multiple Living   4    4  3 1         Past Medical History  Diagnosis Date  . Bipolar 1 disorder ([place])   . Attention and concentration deficit   . Exposure to STD   . 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 Osteoid sarcoma [facility])     Past Surgical History  Procedure Laterality Date  . Tonsilectomy, adenoidectomy, bilateral myringotomy and tubes    . Nasal sinus surgery      Family History  Problem Relation Age of Onset  . Heart disease Father   . Cancer Paternal Aunt     breast   . Cancer Maternal Grandmother     breast  . Parkinsonism Maternal Grandfather     Social History  Substance Use Topics  . Smoking status: Former Smoker -- 3 years    Quit date: 10/23/2011  . Smokeless tobacco: Never Used  . Alcohol Use: 6.6 oz/week    3 Standard drinks or equivalent, 8 Glasses of wine per week    Allergies:  Allergies  Allergen Reactions  . Ceclor [Cefaclor]     No  prescriptions prior to admission    Review of Systems  Constitutional: Negative for fever and malaise/fatigue.  Gastrointestinal: Positive for nausea and abdominal pain. Negative for vomiting, diarrhea and constipation.  Genitourinary: Negative for dysuria, urgency and frequency.       + vaginal bleeding, discharge   Physical Exam   Blood pressure 131/75, pulse 81, temperature 98 F (36.7 C), resp. rate 18, height 5\' 7"  (1.702 m), weight 210 lb (95.255 kg).  Physical Exam  Nursing note and vitals reviewed. Constitutional: She is oriented to person, place, and time. She appears well-developed and well-nourished. No distress.  HENT:  Head: Normocephalic and atraumatic.  Cardiovascular: Normal rate.   Respiratory: Effort normal.  GI: Soft. She exhibits no distension and no mass. There is tenderness (mild tenderness to palpation of the lower abdomen bilaterally). There is no rebound and no guarding.  Genitourinary: Uterus is not enlarged and not tender. Cervix exhibits no motion tenderness, no discharge and no friability. Right adnexum displays tenderness (very mild). Right adnexum displays no mass. Left adnexum displays no mass and no tenderness. There is bleeding (scant dark brown) in the vagina. Vaginal discharge (scant mucus) found.  Neurological: She is alert and oriented to person, place, and time.  Skin: Skin is warm  and dry. No erythema.  Psychiatric: She has a normal mood and affect.    Results for orders placed or performed during the hospital encounter of 09/24/15 (from the past 24 hour(s))  Urinalysis, Routine w reflex microscopic (not at [facility])     Status: Abnormal   Collection Time: 09/24/15  8:43 PM  Result Value Ref Range   Color, Urine YELLOW YELLOW   APPearance CLEAR CLEAR   Specific Gravity, Urine >1.030 (H) 1.005 - 1.030   pH 6.0 5.0 - 8.0   Glucose, UA NEGATIVE NEGATIVE mg/dL   Hgb urine dipstick SMALL (A) NEGATIVE   Bilirubin Urine NEGATIVE NEGATIVE   Ketones,  ur NEGATIVE NEGATIVE mg/dL   Protein, ur NEGATIVE NEGATIVE mg/dL   Nitrite NEGATIVE NEGATIVE   Leukocytes, UA NEGATIVE NEGATIVE  Urine microscopic-add on     Status: Abnormal   Collection Time: 09/24/15  8:43 PM  Result Value Ref Range   Squamous Epithelial / LPF 0-5 (A) NONE SEEN   WBC, UA NONE SEEN 0 - 5 WBC/hpf   RBC / HPF 0-5 0 - 5 RBC/hpf   Bacteria, UA RARE (A) NONE SEEN   Urine-Other MUCOUS PRESENT   Pregnancy, urine POC     Status: None   Collection Time: 09/24/15  8:51 PM  Result Value Ref Range   Preg Test, Ur NEGATIVE NEGATIVE  Wet prep, genital     Status: Abnormal   Collection Time: 09/24/15  9:10 PM  Result Value Ref Range   Yeast Wet Prep HPF POC NONE SEEN NONE SEEN   Trich, Wet Prep NONE SEEN NONE SEEN   Clue Cells Wet Prep HPF POC NONE SEEN NONE SEEN   WBC, Wet Prep HPF POC FEW (A) NONE SEEN   Sperm NONE SEEN   CBC with Differential/Platelet     Status: None   Collection Time: 09/24/15  9:22 PM  Result Value Ref Range   WBC 8.8 4.0 - 10.5 K/uL   RBC 4.68 3.87 - 5.11 MIL/uL   Hemoglobin 14.5 12.0 - 15.0 g/dL   HCT 42.4 36.0 - 46.0 %   MCV 90.6 78.0 - 100.0 fL   MCH 31.0 26.0 - 34.0 pg   MCHC 34.2 30.0 - 36.0 g/dL   RDW 12.8 11.5 - 15.5 %   Platelets 308 150 - 400 K/uL   Neutrophils Relative % 64 %   Neutro Abs 5.6 1.7 - 7.7 K/uL   Lymphocytes Relative 31 %   Lymphs Abs 2.7 0.7 - 4.0 K/uL   Monocytes Relative 4 %   Monocytes Absolute 0.3 0.1 - 1.0 K/uL   Eosinophils Relative 1 %   Eosinophils Absolute 0.1 0.0 - 0.7 K/uL   Basophils Relative 0 %   Basophils Absolute 0.0 0.0 - 0.1 K/uL    MAU Course  Procedures None  MDM UPT - negative UA, wet prep, GC/Chlmaydia, HIV, RPR and CBC today  2 Percocet given for pain - patient reports some improvement in pain prior to discharge Patient states main concern was that last time she had pelvic pain and breast tenderness she was pregnant with the Mirena in place. She miscarried the pregnancy and had new  Mirena placed in August.   Assessment and Plan  A: Pelvic pain Mirena check up Dehydration, mild  P: Discharge home Rx for Percocet given to patient  Warning signs for worsening condition discussed Outpatient [place] ordered. They will call her with an appointment Increased PO hydration advised as tolerated  Patient advised to  follow-up with [facility] as needed if symptoms persist or worsen Patient may return to MAU as needed or if her condition were to change or worsen   Luvenia Redden, PA-C  09/24/2015, 10:11 PM

## 2015-09-26 LAB — GC/CHLAMYDIA PROBE AMP (~~LOC~~) NOT AT ARMC
Chlamydia: NEGATIVE
NEISSERIA GONORRHEA: NEGATIVE

## 2015-09-26 LAB — RPR: RPR: NONREACTIVE

## 2015-09-26 LAB — HIV ANTIBODY (ROUTINE TESTING W REFLEX): HIV SCREEN 4TH GENERATION: NONREACTIVE

## 2015-10-03 ENCOUNTER — Ambulatory Visit (HOSPITAL_COMMUNITY)
Admission: RE | Admit: 2015-10-03 | Discharge: 2015-10-03 | Disposition: A | Discharge status: Home / Self Care | Payer: BLUE CROSS/BLUE SHIELD | Source: Ambulatory Visit | Attending: Medical | Admitting: Medical

## 2015-10-03 DIAGNOSIS — Z975 Presence of (intrauterine) contraceptive device: Secondary | ICD-10-CM | POA: Diagnosis not present

## 2015-10-03 DIAGNOSIS — N83202 Unspecified ovarian cyst, left side: Secondary | ICD-10-CM | POA: Diagnosis not present

## 2015-10-03 DIAGNOSIS — R102 Pelvic and perineal pain: Secondary | ICD-10-CM | POA: Insufficient documentation

## 2015-10-04 ENCOUNTER — Telehealth: Payer: Self-pay | Admitting: General Practice

## 2015-10-04 DIAGNOSIS — N83202 Unspecified ovarian cyst, left side: Secondary | ICD-10-CM

## 2015-10-04 NOTE — Telephone Encounter (Signed)
Per julie, patient needs MRI for further imaging. Called patient and informed her of need for MRI & provided [facility]'s information for patient to call as we cannot schedule that appt. Patient verbalized understanding to all and asked about refill on pain medication because she has started to bleed. Told patient we usually do not refill that medication and recommended she try prescription strength ibuprofen. Patient verbalized understanding to all & had no questions.

## 2015-10-11 ENCOUNTER — Ambulatory Visit
Admission: RE | Admit: 2015-10-11 | Discharge: 2015-10-11 | Disposition: A | Discharge status: Home / Self Care | Payer: BLUE CROSS/BLUE SHIELD | Source: Ambulatory Visit | Attending: Medical | Admitting: Medical

## 2015-10-11 DIAGNOSIS — N83202 Unspecified ovarian cyst, left side: Secondary | ICD-10-CM

## 2015-10-11 MED ORDER — GADOBENATE DIMEGLUMINE 529 MG/ML IV SOLN
19.0000 mL | Freq: Once | INTRAVENOUS | Status: AC | PRN
  Administered 2015-10-11: 19 mL via INTRAVENOUS

## 2015-10-12 ENCOUNTER — Telehealth: Payer: Self-pay | Admitting: General Practice

## 2015-10-12 DIAGNOSIS — N83202 Unspecified ovarian cyst, left side: Secondary | ICD-10-CM

## 2015-10-12 NOTE — Telephone Encounter (Signed)
Per Dr Harolyn Rutherford, patient needs f/u ultrasound in 6 weeks to evaluate resolution of cyst. Cyst found on MRI is most likely benign given patient's age but will have patient come in for ca-125. Called patient and informed her of results & recommended follow up ultrasound. Notified patient of u/s appt 8/2 @ 2pm. Also discussed her coming in for blood test and that we could do a pap smear that day to make sure she is updated on that as well. Patient verbalized understanding to all & had no questions

## 2015-10-16 ENCOUNTER — Ambulatory Visit (INDEPENDENT_AMBULATORY_CARE_PROVIDER_SITE_OTHER): Payer: BLUE CROSS/BLUE SHIELD | Admitting: Obstetrics & Gynecology

## 2015-10-16 ENCOUNTER — Encounter: Payer: Self-pay | Admitting: Obstetrics & Gynecology

## 2015-10-16 VITALS — BP 127/52 | HR 64 | Temp 98.0°F | Ht 67.0 in | Wt 211.8 lb

## 2015-10-16 DIAGNOSIS — Z01419 Encounter for gynecological examination (general) (routine) without abnormal findings: Secondary | ICD-10-CM | POA: Diagnosis not present

## 2015-10-16 DIAGNOSIS — Z124 Encounter for screening for malignant neoplasm of cervix: Secondary | ICD-10-CM

## 2015-10-16 DIAGNOSIS — Z113 Encounter for screening for infections with a predominantly sexual mode of transmission: Secondary | ICD-10-CM | POA: Diagnosis not present

## 2015-10-16 DIAGNOSIS — N83202 Unspecified ovarian cyst, left side: Secondary | ICD-10-CM | POA: Diagnosis not present

## 2015-10-16 NOTE — Patient Instructions (Signed)
Thank you for enrolling in [facility]. Please follow the instructions below to securely access your online medical record. MyChart allows you to send messages to your doctor, view your test results, manage appointments, and more.   How Do I Sign Up? 1. In your Internet browser, go to [employer] and enter [email or web address]. 2. Click on the Sign Up Now link in the Sign In box. You will see the New Member Sign Up page. 3. Enter your MyChart Access Code exactly as it appears below. You will not need to use this code after you've completed the sign-up process. If you do not sign up before the expiration date, you must request a new code.  MyChart Access Code: [email or web address] Expires: 10/22/2015  6:02 PM  4. Enter your Social Security Number ([id number]) and Date of Birth (mm/dd/yyyy) as indicated and click Submit. You will be taken to the next sign-up page. 5. Create a MyChart ID. This will be your MyChart login ID and cannot be changed, so think of one that is secure and easy to remember. 6. Create a MyChart password. You can change your password at any time. 7. Enter your Password Reset Question and Answer. This can be used at a later time if you forget your password.  8. Enter your e-mail address. You will receive e-mail notification when new information is available in [facility]. 9. Click Sign Up. You can now view your medical record.   Additional Information Remember, MyChart is NOT to be used for urgent needs. For medical emergencies, dial 911.  Preventive Care for Adults, Female A healthy lifestyle and preventive care can promote health and wellness. Preventive health guidelines for women include the following key practices.  A routine yearly physical is a good way to check with your health care provider about your health and preventive screening. It is a chance to share any concerns and updates on your health and to receive a thorough exam.  Visit your dentist for a routine  exam and preventive care every 6 months. Brush your teeth twice a day and floss once a day. Good oral hygiene prevents tooth decay and gum disease.  The frequency of eye exams is based on your age, health, family medical history, use of contact lenses, and other factors. Follow your health care provider's recommendations for frequency of eye exams.  Eat a healthy diet. Foods like vegetables, fruits, whole grains, low-fat dairy products, and lean protein foods contain the nutrients you need without too many calories. Decrease your intake of foods high in solid fats, added sugars, and salt. Eat the right amount of calories for you.Get information about a proper diet from your health care provider, if necessary.  Regular physical exercise is one of the most important things you can do for your health. Most adults should get at least 150 minutes of moderate-intensity exercise (any activity that increases your heart rate and causes you to sweat) each week. In addition, most adults need muscle-strengthening exercises on 2 or more days a week.  Maintain a healthy weight. The body mass index (BMI) is a screening tool to identify possible weight problems. It provides an estimate of body fat based on height and weight. Your health care provider can find your BMI and can help you achieve or maintain a healthy weight.For adults 20 years and older:  A BMI below 18.5 is considered underweight.  A BMI of 18.5 to 24.9 is normal.  A BMI of 25 to 29.9 is considered overweight.  A BMI  of 30 and above is considered obese.  Maintain normal blood lipids and cholesterol levels by exercising and minimizing your intake of saturated fat. Eat a balanced diet with plenty of fruit and vegetables. Blood tests for lipids and cholesterol should begin at age 12 and be repeated every 5 years. If your lipid or cholesterol levels are high, you are over 50, or you are at high risk for heart disease, you may need your cholesterol  levels checked more frequently.Ongoing high lipid and cholesterol levels should be treated with medicines if diet and exercise are not working.  If you smoke, find out from your health care provider how to quit. If you do not use tobacco, do not start.  Lung cancer screening is recommended for adults aged 63-80 years who are at high risk for developing lung cancer because of a history of smoking. A yearly low-dose CT scan of the lungs is recommended for people who have at least a 30-pack-year history of smoking and are a current smoker or have quit within the past 15 years. A pack year of smoking is smoking an average of 1 pack of cigarettes a day for 1 year (for example: 1 pack a day for 30 years or 2 packs a day for 15 years). Yearly screening should continue until the smoker has stopped smoking for at least 15 years. Yearly screening should be stopped for people who develop a health problem that would prevent them from having lung cancer treatment.  If you are pregnant, do not drink alcohol. If you are breastfeeding, be very cautious about drinking alcohol. If you are not pregnant and choose to drink alcohol, do not have more than 1 drink per day. One drink is considered to be 12 ounces (355 mL) of beer, 5 ounces (148 mL) of wine, or 1.5 ounces (44 mL) of liquor.  Avoid use of street drugs. Do not share needles with anyone. Ask for help if you need support or instructions about stopping the use of drugs.  High blood pressure causes heart disease and increases the risk of stroke. Your blood pressure should be checked at least every 1 to 2 years. Ongoing high blood pressure should be treated with medicines if weight loss and exercise do not work.  If you are 63-62 years old, ask your health care provider if you should take aspirin to prevent strokes.  Diabetes screening is done by taking a blood sample to check your blood glucose level after you have not eaten for a certain period of time (fasting).  If you are not overweight and you do not have risk factors for diabetes, you should be screened once every 3 years starting at age 55. If you are overweight or obese and you are 61-33 years of age, you should be screened for diabetes every year as part of your cardiovascular risk assessment.  Breast cancer screening is essential preventive care for women. You should practice "breast self-awareness." This means understanding the normal appearance and feel of your breasts and may include breast self-examination. Any changes detected, no matter how small, should be reported to a health care provider. Women in their 62s and 30s should have a clinical breast exam (CBE) by a health care provider as part of a regular health exam every 1 to 3 years. After age 33, women should have a CBE every year. Starting at age 88, women should consider having a mammogram (breast X-ray test) every year. Women who have a family history of breast cancer  should talk to their health care provider about genetic screening. Women at a high risk of breast cancer should talk to their health care providers about having an MRI and a mammogram every year.  Breast cancer gene (BRCA)-related cancer risk assessment is recommended for women who have family members with BRCA-related cancers. BRCA-related cancers include breast, ovarian, tubal, and peritoneal cancers. Having family members with these cancers may be associated with an increased risk for harmful changes (mutations) in the breast cancer genes BRCA1 and BRCA2. Results of the assessment will determine the need for genetic counseling and BRCA1 and BRCA2 testing.  Your health care provider may recommend that you be screened regularly for cancer of the pelvic organs (ovaries, uterus, and vagina). This screening involves a pelvic examination, including checking for microscopic changes to the surface of your cervix (Pap test). You may be encouraged to have this screening done every 3 years,  beginning at age 59.  For women ages 71-65, health care providers may recommend pelvic exams and Pap testing every 3 years, or they may recommend the Pap and pelvic exam, combined with testing for human papilloma virus (HPV), every 5 years. Some types of HPV increase your risk of cervical cancer. Testing for HPV may also be done on women of any age with unclear Pap test results.  Other health care providers may not recommend any screening for nonpregnant women who are considered low risk for pelvic cancer and who do not have symptoms. Ask your health care provider if a screening pelvic exam is right for you.  If you have had past treatment for cervical cancer or a condition that could lead to cancer, you need Pap tests and screening for cancer for at least 20 years after your treatment. If Pap tests have been discontinued, your risk factors (such as having a new sexual partner) need to be reassessed to determine if screening should resume. Some women have medical problems that increase the chance of getting cervical cancer. In these cases, your health care provider may recommend more frequent screening and Pap tests.  Colorectal cancer can be detected and often prevented. Most routine colorectal cancer screening begins at the age of 70 years and continues through age 28 years. However, your health care provider may recommend screening at an earlier age if you have risk factors for colon cancer. On a yearly basis, your health care provider may provide home test kits to check for hidden blood in the stool. Use of a small camera at the end of a tube, to directly examine the colon (sigmoidoscopy or colonoscopy), can detect the earliest forms of colorectal cancer. Talk to your health care provider about this at age 31, when routine screening begins. Direct exam of the colon should be repeated every 5-10 years through age 5 years, unless early forms of precancerous polyps or small growths are found.  People  who are at an increased risk for hepatitis B should be screened for this virus. You are considered at high risk for hepatitis B if:  You were born in a country where hepatitis B occurs often. Talk with your health care provider about which countries are considered high risk.  Your parents were born in a high-risk country and you have not received a shot to protect against hepatitis B (hepatitis B vaccine).  You have HIV or AIDS.  You use needles to inject street drugs.  You live with, or have sex with, someone who has hepatitis B.  You get hemodialysis treatment.  You take certain medicines for conditions like cancer, organ transplantation, and autoimmune conditions.  Hepatitis C blood testing is recommended for all people born from 1945 through 1965 and any individual with known risks for hepatitis C.  Practice safe sex. Use condoms and avoid high-risk sexual practices to reduce the spread of sexually transmitted infections (STIs). STIs include gonorrhea, chlamydia, syphilis, trichomonas, herpes, HPV, and human immunodeficiency virus (HIV). Herpes, HIV, and HPV are viral illnesses that have no cure. They can result in disability, cancer, and death.  You should be screened for sexually transmitted illnesses (STIs) including gonorrhea and chlamydia if:  You are sexually active and are younger than 24 years.  You are older than 24 years and your health care provider tells you that you are at risk for this type of infection.  Your sexual activity has changed since you were last screened and you are at an increased risk for chlamydia or gonorrhea. Ask your health care provider if you are at risk.  If you are at risk of being infected with HIV, it is recommended that you take a prescription medicine daily to prevent HIV infection. This is called preexposure prophylaxis (PrEP). You are considered at risk if:  You are sexually active and do not regularly use condoms or know the HIV status of  your partner(s).  You take drugs by injection.  You are sexually active with a partner who has HIV.  Talk with your health care provider about whether you are at high risk of being infected with HIV. If you choose to begin PrEP, you should first be tested for HIV. You should then be tested every 3 months for as long as you are taking PrEP.  Osteoporosis is a disease in which the bones lose minerals and strength with aging. This can result in serious bone fractures or breaks. The risk of osteoporosis can be identified using a bone density scan. Women ages 65 years and over and women at risk for fractures or osteoporosis should discuss screening with their health care providers. Ask your health care provider whether you should take a calcium supplement or vitamin D to reduce the rate of osteoporosis.  Menopause can be associated with physical symptoms and risks. Hormone replacement therapy is available to decrease symptoms and risks. You should talk to your health care provider about whether hormone replacement therapy is right for you.  Use sunscreen. Apply sunscreen liberally and repeatedly throughout the day. You should seek shade when your shadow is shorter than you. Protect yourself by wearing long sleeves, pants, a wide-brimmed hat, and sunglasses year round, whenever you are outdoors.  Once a month, do a whole body skin exam, using a mirror to look at the skin on your back. Tell your health care provider of new moles, moles that have irregular borders, moles that are larger than a pencil eraser, or moles that have changed in shape or color.  Stay current with required vaccines (immunizations).  Influenza vaccine. All adults should be immunized every year.  Tetanus, diphtheria, and acellular pertussis (Td, Tdap) vaccine. Pregnant women should receive 1 dose of Tdap vaccine during each pregnancy. The dose should be obtained regardless of the length of time since the last dose. Immunization is  preferred during the 27th-36th week of gestation. An adult who has not previously received Tdap or who does not know her vaccine status should receive 1 dose of Tdap. This initial dose should be followed by tetanus and diphtheria toxoids (Td) booster doses every   10 years. Adults with an unknown or incomplete history of completing a 3-dose immunization series with Td-containing vaccines should begin or complete a primary immunization series including a Tdap dose. Adults should receive a Td booster every 10 years.  Varicella vaccine. An adult without evidence of immunity to varicella should receive 2 doses or a second dose if she has previously received 1 dose. Pregnant females who do not have evidence of immunity should receive the first dose after pregnancy. This first dose should be obtained before leaving the health care facility. The second dose should be obtained 4-8 weeks after the first dose.  Human papillomavirus (HPV) vaccine. Females aged 13-26 years who have not received the vaccine previously should obtain the 3-dose series. The vaccine is not recommended for use in pregnant females. However, pregnancy testing is not needed before receiving a dose. If a female is found to be pregnant after receiving a dose, no treatment is needed. In that case, the remaining doses should be delayed until after the pregnancy. Immunization is recommended for any person with an immunocompromised condition through the age of 26 years if she did not get any or all doses earlier. During the 3-dose series, the second dose should be obtained 4-8 weeks after the first dose. The third dose should be obtained 24 weeks after the first dose and 16 weeks after the second dose.  Zoster vaccine. One dose is recommended for adults aged 60 years or older unless certain conditions are present.  Measles, mumps, and rubella (MMR) vaccine. Adults born before 1957 generally are considered immune to measles and mumps. Adults born in 1957  or later should have 1 or more doses of MMR vaccine unless there is a contraindication to the vaccine or there is laboratory evidence of immunity to each of the three diseases. A routine second dose of MMR vaccine should be obtained at least 28 days after the first dose for students attending postsecondary schools, health care workers, or international travelers. People who received inactivated measles vaccine or an unknown type of measles vaccine during 1963-1967 should receive 2 doses of MMR vaccine. People who received inactivated mumps vaccine or an unknown type of mumps vaccine before 1979 and are at high risk for mumps infection should consider immunization with 2 doses of MMR vaccine. For females of childbearing age, rubella immunity should be determined. If there is no evidence of immunity, females who are not pregnant should be vaccinated. If there is no evidence of immunity, females who are pregnant should delay immunization until after pregnancy. Unvaccinated health care workers born before 1957 who lack laboratory evidence of measles, mumps, or rubella immunity or laboratory confirmation of disease should consider measles and mumps immunization with 2 doses of MMR vaccine or rubella immunization with 1 dose of MMR vaccine.  Pneumococcal 13-valent conjugate (PCV13) vaccine. When indicated, a person who is uncertain of his immunization history and has no record of immunization should receive the PCV13 vaccine. All adults 65 years of age and older should receive this vaccine. An adult aged 19 years or older who has certain medical conditions and has not been previously immunized should receive 1 dose of PCV13 vaccine. This PCV13 should be followed with a dose of pneumococcal polysaccharide (PPSV23) vaccine. Adults who are at high risk for pneumococcal disease should obtain the PPSV23 vaccine at least 8 weeks after the dose of PCV13 vaccine. Adults older than 25 years of age who have normal immune system  function should obtain the PPSV23 vaccine   dose at least 1 year after the dose of PCV13 vaccine.  Pneumococcal polysaccharide (PPSV23) vaccine. When PCV13 is also indicated, PCV13 should be obtained first. All adults aged 65 years and older should be immunized. An adult younger than age 65 years who has certain medical conditions should be immunized. Any person who resides in a nursing home or long-term care facility should be immunized. An adult smoker should be immunized. People with an immunocompromised condition and certain other conditions should receive both PCV13 and PPSV23 vaccines. People with human immunodeficiency virus (HIV) infection should be immunized as soon as possible after diagnosis. Immunization during chemotherapy or radiation therapy should be avoided. Routine use of PPSV23 vaccine is not recommended for American Indians, [place] Natives, or people younger than 65 years unless there are medical conditions that require PPSV23 vaccine. When indicated, people who have unknown immunization and have no record of immunization should receive PPSV23 vaccine. One-time revaccination 5 years after the first dose of PPSV23 is recommended for people aged 19-64 years who have chronic kidney failure, nephrotic syndrome, asplenia, or immunocompromised conditions. People who received 1-2 doses of PPSV23 before age 65 years should receive another dose of PPSV23 vaccine at age 65 years or later if at least 5 years have passed since the previous dose. Doses of PPSV23 are not needed for people immunized with PPSV23 at or after age 65 years.  Meningococcal vaccine. Adults with asplenia or persistent complement component deficiencies should receive 2 doses of quadrivalent meningococcal conjugate (MenACWY-D) vaccine. The doses should be obtained at least 2 months apart. Microbiologists working with certain meningococcal bacteria, military recruits, people at risk during an outbreak, and people who travel to or live  in countries with a high rate of meningitis should be immunized. A first-year college student up through age 21 years who is living in a residence hall should receive a dose if she did not receive a dose on or after her 16th birthday. Adults who have certain high-risk conditions should receive one or more doses of vaccine.  Hepatitis A vaccine. Adults who wish to be protected from this disease, have certain high-risk conditions, work with hepatitis A-infected animals, work in hepatitis A research labs, or travel to or work in countries with a high rate of hepatitis A should be immunized. Adults who were previously unvaccinated and who anticipate close contact with an international adoptee during the first 60 days after arrival in the [place] from a country with a high rate of hepatitis A should be immunized.  Hepatitis B vaccine. Adults who wish to be protected from this disease, have certain high-risk conditions, may be exposed to blood or other infectious body fluids, are household contacts or sex partners of hepatitis B positive people, are clients or workers in certain care facilities, or travel to or work in countries with a high rate of hepatitis B should be immunized.  Haemophilus influenzae type b (Hib) vaccine. A previously unvaccinated person with asplenia or sickle cell disease or having a scheduled splenectomy should receive 1 dose of Hib vaccine. Regardless of previous immunization, a recipient of a hematopoietic stem cell transplant should receive a 3-dose series 6-12 months after her successful transplant. Hib vaccine is not recommended for adults with HIV infection. Preventive Services / Frequency Ages 19 to 39 years  Blood pressure check.** / Every 3-5 years.  Lipid and cholesterol check.** / Every 5 years beginning at age 20.  Clinical breast exam.** / Every 3 years for women in their 20s   and 30s.  BRCA-related cancer risk assessment.** / For women who have family members with  a BRCA-related cancer (breast, ovarian, tubal, or peritoneal cancers).  Pap test.** / Every 2 years from ages 21 through 29. Every 3 years starting at age 30 through age 65 or 70 with a history of 3 consecutive normal Pap tests.  HPV screening.** / Every 3 years from ages 30 through ages 65 to 70 with a history of 3 consecutive normal Pap tests.  Hepatitis C blood test.** / For any individual with known risks for hepatitis C.  Skin self-exam. / Monthly.  Influenza vaccine. / Every year.  Tetanus, diphtheria, and acellular pertussis (Tdap, Td) vaccine.** / Consult your health care provider. Pregnant women should receive 1 dose of Tdap vaccine during each pregnancy. 1 dose of Td every 10 years.  Varicella vaccine.** / Consult your health care provider. Pregnant females who do not have evidence of immunity should receive the first dose after pregnancy.  HPV vaccine. / 3 doses over 6 months, if 26 and younger. The vaccine is not recommended for use in pregnant females. However, pregnancy testing is not needed before receiving a dose.  Measles, mumps, rubella (MMR) vaccine.** / You need at least 1 dose of MMR if you were born in 1957 or later. You may also need a 2nd dose. For females of childbearing age, rubella immunity should be determined. If there is no evidence of immunity, females who are not pregnant should be vaccinated. If there is no evidence of immunity, females who are pregnant should delay immunization until after pregnancy.  Pneumococcal 13-valent conjugate (PCV13) vaccine.** / Consult your health care provider.  Pneumococcal polysaccharide (PPSV23) vaccine.** / 1 to 2 doses if you smoke cigarettes or if you have certain conditions.  Meningococcal vaccine.** / 1 dose if you are age 19 to 21 years and a first-year college student living in a residence hall, or have one of several medical conditions, you need to get vaccinated against meningococcal disease. You may also need  additional booster doses.  Hepatitis A vaccine.** / Consult your health care provider.  Hepatitis B vaccine.** / Consult your health care provider.  Haemophilus influenzae type b (Hib) vaccine.** / Consult your health care provider. Ages 40 to 64 years  Blood pressure check.** / Every year.  Lipid and cholesterol check.** / Every 5 years beginning at age 20 years.  Lung cancer screening. / Every year if you are aged 55-80 years and have a 30-pack-year history of smoking and currently smoke or have quit within the past 15 years. Yearly screening is stopped once you have quit smoking for at least 15 years or develop a health problem that would prevent you from having lung cancer treatment.  Clinical breast exam.** / Every year after age 40 years.  BRCA-related cancer risk assessment.** / For women who have family members with a BRCA-related cancer (breast, ovarian, tubal, or peritoneal cancers).  Mammogram.** / Every year beginning at age 40 years and continuing for as long as you are in good health. Consult with your health care provider.  Pap test.** / Every 3 years starting at age 30 years through age 65 or 70 years with a history of 3 consecutive normal Pap tests.  HPV screening.** / Every 3 years from ages 30 years through ages 65 to 70 years with a history of 3 consecutive normal Pap tests.  Fecal occult blood test (FOBT) of stool. / Every year beginning at age 50 years and continuing   until age 75 years. You may not need to do this test if you get a colonoscopy every 10 years.  Flexible sigmoidoscopy or colonoscopy.** / Every 5 years for a flexible sigmoidoscopy or every 10 years for a colonoscopy beginning at age 50 years and continuing until age 75 years.  Hepatitis C blood test.** / For all people born from 1945 through 1965 and any individual with known risks for hepatitis C.  Skin self-exam. / Monthly.  Influenza vaccine. / Every year.  Tetanus, diphtheria, and acellular  pertussis (Tdap/Td) vaccine.** / Consult your health care provider. Pregnant women should receive 1 dose of Tdap vaccine during each pregnancy. 1 dose of Td every 10 years.  Varicella vaccine.** / Consult your health care provider. Pregnant females who do not have evidence of immunity should receive the first dose after pregnancy.  Zoster vaccine.** / 1 dose for adults aged 60 years or older.  Measles, mumps, rubella (MMR) vaccine.** / You need at least 1 dose of MMR if you were born in 1957 or later. You may also need a second dose. For females of childbearing age, rubella immunity should be determined. If there is no evidence of immunity, females who are not pregnant should be vaccinated. If there is no evidence of immunity, females who are pregnant should delay immunization until after pregnancy.  Pneumococcal 13-valent conjugate (PCV13) vaccine.** / Consult your health care provider.  Pneumococcal polysaccharide (PPSV23) vaccine.** / 1 to 2 doses if you smoke cigarettes or if you have certain conditions.  Meningococcal vaccine.** / Consult your health care provider.  Hepatitis A vaccine.** / Consult your health care provider.  Hepatitis B vaccine.** / Consult your health care provider.  Haemophilus influenzae type b (Hib) vaccine.** / Consult your health care provider. Ages 65 years and over  Blood pressure check.** / Every year.  Lipid and cholesterol check.** / Every 5 years beginning at age 20 years.  Lung cancer screening. / Every year if you are aged 55-80 years and have a 30-pack-year history of smoking and currently smoke or have quit within the past 15 years. Yearly screening is stopped once you have quit smoking for at least 15 years or develop a health problem that would prevent you from having lung cancer treatment.  Clinical breast exam.** / Every year after age 40 years.  BRCA-related cancer risk assessment.** / For women who have family members with a BRCA-related  cancer (breast, ovarian, tubal, or peritoneal cancers).  Mammogram.** / Every year beginning at age 40 years and continuing for as long as you are in good health. Consult with your health care provider.  Pap test.** / Every 3 years starting at age 30 years through age 65 or 70 years with 3 consecutive normal Pap tests. Testing can be stopped between 65 and 70 years with 3 consecutive normal Pap tests and no abnormal Pap or HPV tests in the past 10 years.  HPV screening.** / Every 3 years from ages 30 years through ages 65 or 70 years with a history of 3 consecutive normal Pap tests. Testing can be stopped between 65 and 70 years with 3 consecutive normal Pap tests and no abnormal Pap or HPV tests in the past 10 years.  Fecal occult blood test (FOBT) of stool. / Every year beginning at age 50 years and continuing until age 75 years. You may not need to do this test if you get a colonoscopy every 10 years.  Flexible sigmoidoscopy or colonoscopy.** / Every 5 years   for a flexible sigmoidoscopy or every 10 years for a colonoscopy beginning at age 61 years and continuing until age 94 years.  Hepatitis C blood test.** / For all people born from [birth year] through 1965 and any individual with known risks for hepatitis C.  Osteoporosis screening.** / A one-time screening for women ages 22 years and over and women at risk for fractures or osteoporosis.  Skin self-exam. / Monthly.  Influenza vaccine. / Every year.  Tetanus, diphtheria, and acellular pertussis (Tdap/Td) vaccine.** / 1 dose of Td every 10 years.  Varicella vaccine.** / Consult your health care provider.  Zoster vaccine.** / 1 dose for adults aged 66 years or older.  Pneumococcal 13-valent conjugate (PCV13) vaccine.** / Consult your health care provider.  Pneumococcal polysaccharide (PPSV23) vaccine.** / 1 dose for all adults aged 14 years and older.  Meningococcal vaccine.** / Consult your health care provider.  Hepatitis A vaccine.** /  Consult your health care provider.  Hepatitis B vaccine.** / Consult your health care provider.  Haemophilus influenzae type b (Hib) vaccine.** / Consult your health care provider. ** Family history and personal history of risk and conditions may change your health care provider's recommendations.   This information is not intended to replace advice given to you by your health care provider. Make sure you discuss any questions you have with your health care provider.   Document Released: 06/11/2001 Document Revised: 05/06/2014 Document Reviewed: 09/10/2010 Elsevier Interactive Patient Education [employer].

## 2015-10-16 NOTE — Progress Notes (Signed)
GYNECOLOGY CLINIC ANNUAL PREVENTATIVE CARE ENCOUNTER NOTE  Subjective:   Danielle Evans is a 25 y.o. G65P0040 female here for a routine annual gynecologic exam and to follow up evaluation of 3 cm left complex ovarian cyst with 1 cm nodule seen on recent imaging.  Current complaints: continued pain despite Ibuprofen.   Denies abnormal vaginal bleeding, discharge, problems with intercourse or other gynecologic concerns.    Gynecologic History No LMP recorded. Patient is not currently having periods (Reason: IUD). Contraception: Mirena IUD Last Pap: 2015. Results were: abnormal (does not remember exact results)  Obstetric History OB History  Gravida Para Term Preterm AB SAB TAB Ectopic Multiple Living  4    4 3  1       # Outcome Date GA Lbr Len/2nd Weight Sex Delivery Anes PTL Lv  4 Ectopic           3 SAB           2 SAB           1 SAB               Past Medical History  Diagnosis Date  . Bipolar 1 disorder ([place])   . Attention and concentration deficit   . Exposure to STD   . 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 Vaginal Pap smear, abnormal 2014    Past Surgical History  Procedure Laterality Date  . Tonsilectomy, adenoidectomy, bilateral myringotomy and tubes    . Nasal sinus surgery      Current Outpatient Prescriptions on File Prior to Visit  Medication Sig Dispense Refill  . amphetamine-dextroamphetamine (ADDERALL XR) 30 MG 24 hr capsule Take 30 mg by mouth every morning.    . clonazePAM (KLONOPIN) 0.5 MG tablet Take 0.5 mg by mouth 2 (two) times daily as needed for anxiety.    Marland Kitchen ibuprofen (ADVIL,MOTRIN) 600 MG tablet Take 1 tablet (600 mg total) by mouth every 6 (six) hours as needed for pain. 20 tablet 3  . oxyCODONE-acetaminophen (PERCOCET/ROXICET) 5-325 MG tablet Take 1-2 tablets by mouth every 6 (six) hours as needed for severe pain. 12 tablet 0  . baclofen (LIORESAL) 10 MG tablet Take 10 mg by mouth as needed for muscle spasms. Reported on  10/16/2015    . [DISCONTINUED] etonogestrel-ethinyl estradiol (NUVARING) 0.12-0.015 MG/24HR vaginal ring Use 1 vaginal ring every 3 weeks without week-free 1 each 11  . [DISCONTINUED] norethindrone-ethinyl estradiol 1/35 ([place] 1/35, 28,) tablet 1 tablet by mouth 3 times daily for 7 days, then begin one table daily thereafter 2 Package 11   No current facility-administered medications on file prior to visit.    Allergies  Allergen Reactions  . Ceclor [Cefaclor]     Social History   Social History  . Marital Status: Single    Spouse Name: N/A  . Number of Children: N/A  . Years of Education: N/A   Occupational History  . Not on file.   Social History Main Topics  . Smoking status: Former Smoker -- 3 years    Quit date: 10/23/2011  . Smokeless tobacco: Never Used  . Alcohol Use: 6.6 oz/week    3 Standard drinks or equivalent, 8 Glasses of wine per week  . Drug Use: No     Comment: marijuana occasionally   . Sexual Activity:    Partners: Male    Birth Control/ Protection: Inserts     Comment: Nuvaring   Other Topics Concern  .  Not on file   Social History Narrative    Family History  Problem Relation Age of Onset  . Heart disease Father   . Cancer Paternal Aunt     breast   . Cancer Maternal Grandmother     breast  . Parkinsonism Maternal Grandfather     The following portions of the patient's history were reviewed and updated as appropriate: allergies, current medications, past family history, past medical history, past social history, past surgical history and problem list.  Review of Systems Pertinent items noted in HPI and remainder of comprehensive ROS otherwise negative.   Objective:  BP 127/52 mmHg  Pulse 64  Temp(Src) 98 F (36.7 C)  Ht 5\' 7"  (1.702 m)  Wt 211 lb 12.8 oz (96.072 kg)  BMI 33.16 kg/m2 CONSTITUTIONAL: Well-developed, well-nourished female in no acute distress.  HENT:  Normocephalic, atraumatic, External right and left ear  normal. Oropharynx is clear and moist EYES: Conjunctivae and EOM are normal. Pupils are equal, round, and reactive to light. No scleral icterus.  NECK: Normal range of motion, supple, no masses.  Normal thyroid.  SKIN: Skin is warm and dry. No rash noted. Not diaphoretic. No erythema. No pallor. NEUROLOGIC: Alert and oriented to person, place, and time. Normal reflexes, muscle tone coordination. No cranial nerve deficit noted. PSYCHIATRIC: Normal mood and affect. Normal behavior. Normal judgment and thought content. CARDIOVASCULAR: Normal heart rate noted, regular rhythm RESPIRATORY: Clear to auscultation bilaterally. Effort and breath sounds normal, no problems with respiration noted. BREASTS: Symmetric in size. No masses, skin changes, nipple drainage, or lymphadenopathy. ABDOMEN: Soft, normal bowel sounds, no distention noted.  LLQ tenderness to palpation, no other tenderness, rebound or guarding.  PELVIC: Normal appearing external genitalia; normal appearing vaginal mucosa and cervix.  No abnormal discharge noted.  Pap smear obtained.  Normal uterine size, no other palpable masses, no uterine or adnexal tenderness. MUSCULOSKELETAL: Normal range of motion. No tenderness.  No cyanosis, clubbing, or edema.  2+ distal pulses.  Imaging Studies Mr Pelvis W Wo Contrast  2015/10/13  CLINICAL DATA:  Pelvic cramping. Ultrasound demonstrating a left ovarian cystic lesion with mural nodule. EXAM: MRI PELVIS WITHOUT AND WITH CONTRAST TECHNIQUE: Multiplanar multisequence MR imaging of the pelvis was performed both before and after administration of intravenous contrast. CONTRAST:  66mL MULTIHANCE GADOBENATE DIMEGLUMINE 529 MG/ML IV SOLN COMPARISON:  Ultrasound 6/6/7.  CT 09/12/2011. FINDINGS: Uterus:  Normal in appearance.  Intrauterine device in place. Endometrium: Suboptimally evaluated secondary to the intrauterine device. No endometrial thickening identified. Cervix/Vagina: T2 hypointense cervical stroma  maintained. Normal vagina. Right ovary: Normal in appearance, with multiple small follicles within. Left ovary: Corresponding to the ultrasound abnormality, off the posterior lateral aspect of the otherwise normal-appearing left ovary, is a 2.4 x 2.2 by 3.1 cm lesion. This is T2 hyperintense and T1 hypo intense. Demonstrates an enhancing nodule within its anterior lateral portion of approximately 9 mm, including on image 81/series 8 and image 80/series 9. Urinary Tract: Within normal limits. No distal hydroureter or bladder abnormality. Bowel:  Large colonic stool burden.  Normal pelvic small bowel. Vascular/Lymphatic: Normal pelvic vasculature, without sidewall adenopathy. Other:  No significant free fluid. Musculoskeletal:  L4-5 disc desiccation and mild bulge. IMPRESSION: 1. Left ovarian cystic lesion with 8 mm enhancing nodule within. This is suspicious for an ovarian neoplasm, most likely benign or borderline malignant. Surgical consultation should be considered. 2.  Possible constipation. 3. Appropriate position of intrauterine device. These results will be called to the ordering clinician  or representative by the Radiologist Assistant, and communication documented in the PACS or zVision Dashboard. Electronically Signed   By: Abigail Miyamoto M.D.   On: 10/12/2015 08:34   US Transvaginal Non-ob  10/03/2015  CLINICAL DATA:  Lower abdominal pain x3 weeks, history of ovarian cyst, Mirena IUD EXAM: TRANSABDOMINAL AND TRANSVAGINAL ULTRASOUND OF PELVIS TECHNIQUE: Both transabdominal and transvaginal ultrasound examinations of the pelvis were performed. Transabdominal technique was performed for global imaging of the pelvis including uterus, ovaries, adnexal regions, and pelvic cul-de-sac. It was necessary to proceed with endovaginal exam following the transabdominal exam to visualize the bilateral ovaries. COMPARISON:  CT abdomen pelvis dated 09/12/2011 FINDINGS: Uterus Measurements: 7.0 x 3.2 x 3.6 cm. No fibroids or  other mass visualized. Endometrium Thickness: 6 mm. IUD in satisfactory position in the uterine fundus. Right ovary Measurements: 2.7 x 2.0 x 2.6 cm. Normal appearance/no adnexal mass. Left ovary Measurements: 3.0 x 1.9 x 4.5 cm. 3.1 x 2.3 x 2.8 cm cystic lesion with 10 mm mural nodule (image 66). Other findings No abnormal free fluid. IMPRESSION: 3.1 cm cystic left ovarian lesion with 10 mm mural nodule. Consider MR pelvis with/ without contrast for further evaluation. IUD in satisfactory position in the uterine fundus. Electronically Signed   By: Julian Hy M.D.   On: 10/03/2015 16:06   US Pelvis Complete  10/03/2015  CLINICAL DATA:  Lower abdominal pain x3 weeks, history of ovarian cyst, Mirena IUD EXAM: TRANSABDOMINAL AND TRANSVAGINAL ULTRASOUND OF PELVIS TECHNIQUE: Both transabdominal and transvaginal ultrasound examinations of the pelvis were performed. Transabdominal technique was performed for global imaging of the pelvis including uterus, ovaries, adnexal regions, and pelvic cul-de-sac. It was necessary to proceed with endovaginal exam following the transabdominal exam to visualize the bilateral ovaries. COMPARISON:  CT abdomen pelvis dated 09/12/2011 FINDINGS: Uterus Measurements: 7.0 x 3.2 x 3.6 cm. No fibroids or other mass visualized. Endometrium Thickness: 6 mm. IUD in satisfactory position in the uterine fundus. Right ovary Measurements: 2.7 x 2.0 x 2.6 cm. Normal appearance/no adnexal mass. Left ovary Measurements: 3.0 x 1.9 x 4.5 cm. 3.1 x 2.3 x 2.8 cm cystic lesion with 10 mm mural nodule (image 66). Other findings No abnormal free fluid. IMPRESSION: 3.1 cm cystic left ovarian lesion with 10 mm mural nodule. Consider MR pelvis with/ without contrast for further evaluation. IUD in satisfactory position in the uterine fundus. Electronically Signed   By: Julian Hy M.D.   On: 10/03/2015 16:06     Assessment:  Annual gynecologic examination with pap smear Left ovarian complex  cyst   Plan:  Will follow up results of pap smear and manage accordingly. CA-125 ordered; follow up scan already scheduled on 11/29/2015. Will follow up symptoms and labs, may need to do further evaluation/surgical management if concerns arise.  Routine preventative health maintenance measures emphasized. Please refer to After Visit Summary for other counseling recommendations.    Verita Schneiders, MD, [place] Attending Obstetrician & Gynecologist, [place] for [facility]

## 2015-10-16 NOTE — Progress Notes (Signed)
Here for annual exam.  No period since 11/2014, some spotting.

## 2015-10-17 LAB — CYTOLOGY - PAP

## 2015-10-17 LAB — CA 125: CA 125: 27 U/mL (ref <35)

## 2015-10-18 ENCOUNTER — Telehealth: Payer: Self-pay | Admitting: General Practice

## 2015-10-18 NOTE — Telephone Encounter (Signed)
Per Dr Harolyn Rutherford, patient's ca125 was normal. Patient should f/u at ultrasound scheduled in august. Called patient and informed her of results & recommendation. Patient verbalized understanding & had no questions

## 2015-11-01 ENCOUNTER — Inpatient Hospital Stay (HOSPITAL_COMMUNITY): Payer: BLUE CROSS/BLUE SHIELD

## 2015-11-01 ENCOUNTER — Inpatient Hospital Stay (HOSPITAL_COMMUNITY)
Admission: AD | Admit: 2015-11-01 | Discharge: 2015-11-01 | Disposition: A | Discharge status: Home / Self Care | Payer: BLUE CROSS/BLUE SHIELD | Source: Ambulatory Visit | Attending: Obstetrics and Gynecology | Admitting: Obstetrics and Gynecology

## 2015-11-01 ENCOUNTER — Encounter (HOSPITAL_COMMUNITY): Payer: Self-pay

## 2015-11-01 DIAGNOSIS — Z87891 Personal history of nicotine dependence: Secondary | ICD-10-CM | POA: Insufficient documentation

## 2015-11-01 DIAGNOSIS — Z881 Allergy status to other antibiotic agents status: Secondary | ICD-10-CM | POA: Insufficient documentation

## 2015-11-01 DIAGNOSIS — Z975 Presence of (intrauterine) contraceptive device: Secondary | ICD-10-CM | POA: Diagnosis not present

## 2015-11-01 DIAGNOSIS — Z79899 Other long term (current) drug therapy: Secondary | ICD-10-CM | POA: Insufficient documentation

## 2015-11-01 DIAGNOSIS — N83202 Unspecified ovarian cyst, left side: Secondary | ICD-10-CM | POA: Diagnosis not present

## 2015-11-01 DIAGNOSIS — R1032 Left lower quadrant pain: Secondary | ICD-10-CM | POA: Diagnosis present

## 2015-11-01 DIAGNOSIS — F319 Bipolar disorder, unspecified: Secondary | ICD-10-CM | POA: Diagnosis not present

## 2015-11-01 DIAGNOSIS — R109 Unspecified abdominal pain: Secondary | ICD-10-CM

## 2015-11-01 LAB — URINALYSIS, ROUTINE W REFLEX MICROSCOPIC
Bilirubin Urine: NEGATIVE
GLUCOSE, UA: NEGATIVE mg/dL
HGB URINE DIPSTICK: NEGATIVE
Ketones, ur: NEGATIVE mg/dL
Leukocytes, UA: NEGATIVE
Nitrite: NEGATIVE
PH: 5.5 (ref 5.0–8.0)
Protein, ur: NEGATIVE mg/dL
Specific Gravity, Urine: 1.02 (ref 1.005–1.030)

## 2015-11-01 LAB — POCT PREGNANCY, URINE: Preg Test, Ur: NEGATIVE

## 2015-11-01 MED ORDER — KETOROLAC TROMETHAMINE 60 MG/2ML IM SOLN
60.0000 mg | Freq: Once | INTRAMUSCULAR | Status: DC
Start: 2015-11-01 — End: 2015-11-01
  Filled 2015-11-01: qty 2

## 2015-11-01 MED ORDER — HYDROMORPHONE HCL 1 MG/ML IJ SOLN
1.0000 mg | Freq: Once | INTRAMUSCULAR | Status: AC
  Administered 2015-11-01: 1 mg via INTRAMUSCULAR
  Filled 2015-11-01: qty 1

## 2015-11-01 MED ORDER — TRAMADOL HCL 50 MG PO TABS
50.0000 mg | ORAL_TABLET | Freq: Once | ORAL | Status: AC
  Administered 2015-11-01: 50 mg via ORAL
  Filled 2015-11-01: qty 1

## 2015-11-01 MED ORDER — TRAMADOL HCL 50 MG PO TABS: 50.0000 mg | ORAL_TABLET | Freq: Once | ORAL | Status: DC

## 2015-11-01 MED ORDER — IBUPROFEN 600 MG PO TABS
600.0000 mg | ORAL_TABLET | Freq: Once | ORAL | Status: AC
  Administered 2015-11-01: 600 mg via ORAL
  Filled 2015-11-01: qty 1

## 2015-11-01 MED ORDER — OXYCODONE-ACETAMINOPHEN 5-325 MG PO TABS: 1.0000 | ORAL_TABLET | ORAL | Status: DC | PRN

## 2015-11-01 NOTE — Discharge Instructions (Signed)
Ovarian Cyst An ovarian cyst is a fluid-filled sac that forms on an ovary. The ovaries are small organs that produce eggs in women. Various types of cysts can form on the ovaries. Most are not cancerous. Many do not cause problems, and they often go away on their own. Some may cause symptoms and require treatment. Common types of ovarian cysts include:  Functional cysts--These cysts may occur every month during the menstrual cycle. This is normal. The cysts usually go away with the next menstrual cycle if the woman does not get pregnant. Usually, there are no symptoms with a functional cyst.  Endometrioma cysts--These cysts form from the tissue that lines the uterus. They are also called "chocolate cysts" because they become filled with blood that turns brown. This type of cyst can cause pain in the lower abdomen during intercourse and with your menstrual period.  Cystadenoma cysts--This type develops from the cells on the outside of the ovary. These cysts can get very big and cause lower abdomen pain and pain with intercourse. This type of cyst can twist on itself, cut off its blood supply, and cause severe pain. It can also easily rupture and cause a lot of pain.  Dermoid cysts--This type of cyst is sometimes found in both ovaries. These cysts may contain different kinds of body tissue, such as skin, teeth, hair, or cartilage. They usually do not cause symptoms unless they get very big.  Theca lutein cysts--These cysts occur when too much of a certain hormone (human chorionic gonadotropin) is produced and overstimulates the ovaries to produce an egg. This is most common after procedures used to assist with the conception of a baby (in vitro fertilization). CAUSES   Fertility drugs can cause a condition in which multiple large cysts are formed on the ovaries. This is called ovarian hyperstimulation syndrome.  A condition called polycystic ovary syndrome can cause hormonal imbalances that can lead to  nonfunctional ovarian cysts. SIGNS AND SYMPTOMS  Many ovarian cysts do not cause symptoms. If symptoms are present, they may include:  Pelvic pain or pressure.  Pain in the lower abdomen.  Pain during sexual intercourse.  Increasing girth (swelling) of the abdomen.  Abnormal menstrual periods.  Increasing pain with menstrual periods.  Stopping having menstrual periods without being pregnant. DIAGNOSIS  These cysts are commonly found during a routine or annual pelvic exam. Tests may be ordered to find out more about the cyst. These tests may include:  Ultrasound.  X-ray of the pelvis.  CT scan.  MRI.  Blood tests. TREATMENT  Many ovarian cysts go away on their own without treatment. Your health care provider may want to check your cyst regularly for 2-3 months to see if it changes. For women in menopause, it is particularly important to monitor a cyst closely because of the higher rate of ovarian cancer in menopausal women. When treatment is needed, it may include any of the following:  A procedure to drain the cyst (aspiration). This may be done using a long needle and ultrasound. It can also be done through a laparoscopic procedure. This involves using a thin, lighted tube with a tiny camera on the end (laparoscope) inserted through a small incision.  Surgery to remove the whole cyst. This may be done using laparoscopic surgery or an open surgery involving a larger incision in the lower abdomen.  Hormone treatment or birth control pills. These methods are sometimes used to help dissolve a cyst. HOME CARE INSTRUCTIONS   Only take over-the-counter   or prescription medicines as directed by your health care provider.  Follow up with your health care provider as directed.  Get regular pelvic exams and Pap tests. SEEK MEDICAL CARE IF:   Your periods are late, irregular, or painful, or they stop.  Your pelvic pain or abdominal pain does not go away.  Your abdomen becomes  larger or swollen.  You have pressure on your bladder or trouble emptying your bladder completely.  You have pain during sexual intercourse.  You have feelings of fullness, pressure, or discomfort in your stomach.  You lose weight for no apparent reason.  You feel generally ill.  You become constipated.  You lose your appetite.  You develop acne.  You have an increase in body and facial hair.  You are gaining weight, without changing your exercise and eating habits.  You think you are pregnant. SEEK IMMEDIATE MEDICAL CARE IF:   You have increasing abdominal pain.  You feel sick to your stomach (nauseous), and you throw up (vomit).  You develop a fever that comes on suddenly.  You have abdominal pain during a bowel movement.  Your menstrual periods become heavier than usual. MAKE SURE YOU:  Understand these instructions.  Will watch your condition.  Will get help right away if you are not doing well or get worse.   This information is not intended to replace advice given to you by your health care provider. Make sure you discuss any questions you have with your health care provider.   Document Released: 04/15/2005 Document Revised: 04/20/2013 Document Reviewed: 12/21/2012 Elsevier Interactive Patient Education 2016 [employer]  

## 2015-11-01 NOTE — MAU Note (Signed)
Having severe abd pain.   Has gotten increasingly worse.  Was dx with an ovarian cyst about a month ago.

## 2015-11-01 NOTE — MAU Provider Note (Signed)
History     CSN: [id number]  Arrival date and time: 11/01/15 1648   None     Chief Complaint  Patient presents with  . Abdominal Pain   HPI   [name] is a 25 y.o. female [id number] with a history of ovarian cyst here with worsening abdominal pain. The pain is located in the LLQ; the pain started over a month ago. She had an [place] and a Recent MRI that showed a  3.0 x 1.9 x 4.5 cm. 3.1 x 2.3 x 2.8 cm cystic lesion with 10 mm mural nodule (image 66). The patient had follow up in the [place] and was told that if the lesion grew or the pain worsened that she may need to have surgery. 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  The pain is described as 8/10; the last time she took pain medication was 0800 (600 mg of ibuprofen) and this helped only minimally.   Vaginal bleeding started this morning, scant amount.   OB History    Gravida Para Term Preterm AB TAB SAB Ectopic Multiple Living   4    4  3 1         Past Medical History  Diagnosis Date  . Bipolar 1 disorder ([place])   . Attention and concentration deficit   . Exposure to STD   . 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)   . Vaginal Pap smear, abnormal 2014    Past Surgical History  Procedure Laterality Date  . Tonsilectomy, adenoidectomy, bilateral myringotomy and tubes    . Nasal sinus surgery    . Tonsillectomy      Family History  Problem Relation Age of Onset  . Heart disease Father   . Cancer Paternal Aunt     breast   . Cancer Maternal Grandmother     breast  . Parkinsonism Maternal Grandfather     Social History  Substance Use Topics  . Smoking status: Former Smoker -- 3 years    Quit date: 10/23/2011  . Smokeless tobacco: Never Used  . Alcohol Use: 6.6 oz/week    8 Glasses of wine, 3 Standard drinks or equivalent per week    Allergies:  Allergies  Allergen Reactions  . Ceclor  [Cefaclor]     Prescriptions prior to admission  Medication Sig Dispense Refill Last Dose  . amphetamine-dextroamphetamine (ADDERALL XR) 30 MG 24 hr capsule Take 30 mg by mouth every morning.   Taking  . baclofen (LIORESAL) 10 MG tablet Take 10 mg by mouth as needed for muscle spasms. Reported on 10/16/2015   Not Taking  . clonazePAM (KLONOPIN) 0.5 MG tablet Take 0.5 mg by mouth 2 (two) times daily as needed for anxiety.   Taking  . ibuprofen (ADVIL,MOTRIN) 600 MG tablet Take 1 tablet (600 mg total) by mouth every 6 (six) hours as needed for pain. 20 tablet 3 Taking  . levonorgestrel (MIRENA) 20 MCG/24HR IUD 1 Intra Uterine Device by Intrauterine route once.   Taking  . oxyCODONE-acetaminophen (PERCOCET/ROXICET) 5-325 MG tablet Take 1-2 tablets by mouth every 6 (six) hours as needed for severe pain. 12 tablet 0 Taking  . valACYclovir (VALTREX) 1000 MG tablet Take 1 tablet by mouth as needed. Reported on 10/16/2015   Not Taking   Results for orders placed or performed during the hospital encounter of 11/01/15 (from the  past 48 hour(s))  Urinalysis, Routine w reflex microscopic (not at [facility])     Status: None   Collection Time: 11/01/15  4:52 PM  Result Value Ref Range   Color, Urine YELLOW YELLOW   APPearance CLEAR CLEAR   Specific Gravity, Urine 1.020 1.005 - 1.030   pH 5.5 5.0 - 8.0   Glucose, UA NEGATIVE NEGATIVE mg/dL   Hgb urine dipstick NEGATIVE NEGATIVE   Bilirubin Urine NEGATIVE NEGATIVE   Ketones, ur NEGATIVE NEGATIVE mg/dL   Protein, ur NEGATIVE NEGATIVE mg/dL   Nitrite NEGATIVE NEGATIVE   Leukocytes, UA NEGATIVE NEGATIVE    Comment: MICROSCOPIC NOT DONE ON URINES WITH NEGATIVE PROTEIN, BLOOD, LEUKOCYTES, NITRITE, OR GLUCOSE <1000 mg/dL.  Pregnancy, urine POC     Status: None   Collection Time: 11/01/15  5:04 PM  Result Value Ref Range   Preg Test, Ur NEGATIVE NEGATIVE    Comment:        THE SENSITIVITY OF THIS METHODOLOGY IS >24 mIU/mL    US Transvaginal  Non-ob  11/01/2015  CLINICAL DATA:  Severe pelvic pain, 5 days duration. Follow-up left ovarian cyst with enhancing mural nodule. EXAM: TRANSVAGINAL ULTRASOUND OF PELVIS DOPPLER ULTRASOUND OF OVARIES TECHNIQUE: transvaginal ultrasound examinations of the pelvis were performed. Color and duplex Doppler ultrasound was utilized to evaluate blood flow to the ovaries. COMPARISON:  Ultrasound 10/03/2015.  MRI 10/11/2015. FINDINGS: Uterus Measurements: 7.0 x 3.0 x 3.7 cm. No fibroids or other mass visualized. Endometrium Thickness: 3.4 mm.  IUD well positioned. Right ovary Measurements: 2.9 x 1.6 x 1.6 cm. Normal appearance/no adnexal mass. Normal follicular cyst. Normal color flow. Left ovary Measurements: Overall measures 4.6 x 2.1 x 2.8 cm. 3.6 x 2.2 x 2.5 cm cyst with internal 1cm nodule, unchanged from the previous imaging. No large vessel blood flow demonstrated within the nodule. Other findings No abnormal free fluid. IMPRESSION: No change of compared to the 2 previous exams. Cystic abnormality of the left ovary measuring 3.6 x 2.2 x 2.5 cm. 1 cm mural nodule. As discussed on the previous studies including MRI, this lesion is suspicious for an ovarian neoplasm. This could be benign or borderline malignant. Surgical consultation suggested. No acute finding to explain change in patient's symptoms. Electronically Signed   By: Nelson Chimes M.D.   On: 11/01/2015 20:28   [place] Art/ven Flow Abd Pelv Doppler  11/01/2015  CLINICAL DATA:  Severe pelvic pain, 5 days duration. Follow-up left ovarian cyst with enhancing mural nodule. EXAM: TRANSVAGINAL ULTRASOUND OF PELVIS DOPPLER ULTRASOUND OF OVARIES TECHNIQUE: transvaginal ultrasound examinations of the pelvis were performed. Color and duplex Doppler ultrasound was utilized to evaluate blood flow to the ovaries. COMPARISON:  Ultrasound 10/03/2015.  MRI 10/11/2015. FINDINGS: Uterus Measurements: 7.0 x 3.0 x 3.7 cm. No fibroids or other mass visualized. Endometrium Thickness:  3.4 mm.  IUD well positioned. Right ovary Measurements: 2.9 x 1.6 x 1.6 cm. Normal appearance/no adnexal mass. Normal follicular cyst. Normal color flow. Left ovary Measurements: Overall measures 4.6 x 2.1 x 2.8 cm. 3.6 x 2.2 x 2.5 cm cyst with internal 1cm nodule, unchanged from the previous imaging. No large vessel blood flow demonstrated within the nodule. Other findings No abnormal free fluid. IMPRESSION: No change of compared to the 2 previous exams. Cystic abnormality of the left ovary measuring 3.6 x 2.2 x 2.5 cm. 1 cm mural nodule. As discussed on the previous studies including MRI, this lesion is suspicious for an ovarian neoplasm. This could be benign or borderline malignant. Surgical  consultation suggested. No acute finding to explain change in patient's symptoms. Electronically Signed   By: Nelson Chimes M.D.   On: 11/01/2015 20:28   Review of Systems  Constitutional: Negative for fever and chills.  Gastrointestinal: Positive for nausea and abdominal pain. Negative for vomiting.  Genitourinary: Negative for dysuria.   Physical Exam   Blood pressure 116/63, pulse 81, temperature 98 F (36.7 C), temperature source Oral, resp. rate 18, weight 208 lb 9.6 oz (94.62 kg).  Physical Exam  Constitutional: She is oriented to person, place, and time. She appears well-developed and well-nourished. She appears distressed.  HENT:  Head: Normocephalic.  Eyes: Pupils are equal, round, and reactive to light.  Respiratory: Effort normal.  GI: Soft. Normal appearance. There is tenderness in the suprapubic area and left lower quadrant. There is guarding. There is no rigidity and no rebound.  Genitourinary:  Bimanual exam: Cervix closed Uterus non tender, normal size Significant left adnexal tenderness with minimal palpation. No mass palpated. Small amount of blood noted on exam glove.  Chaperone present for exam.  Musculoskeletal: Normal range of motion.  Neurological: She is alert and oriented to  person, place, and time.  Skin: Skin is warm. She is not diaphoretic.  Psychiatric: Her behavior is normal.    MAU Course  Procedures  None  MDM  Pelvic US with dopplers.  Ultram 50 mg PO Ibuprofen 600 mg PO After [place] patient is requesting additional pain medication. Dilaudid 1 mg given IM Discussed patient and [place] results with Dr. Glo Herring. He would like her to be put in for an Urgent appointment in the [place] to discuss surgical management.   Pain down to a 3-4/10 at the time of discharge. Patient feels well enough to go home.   Assessment and Plan   A:   1. Cyst of left ovary   2. Abdominal pain     P:  Discharge home in stable condition  Patient instructed to go to the [facility] tomorrow @1400  for follow up appointment.  Rx: Percocet #20 Continue taking ibuprofen as directed on the bottle Return to MAU if the pain worsens despite pain medication.  Pelvic rest   Lezlie Lye, NP 11/02/2015 8:20 AM

## 2015-11-02 ENCOUNTER — Ambulatory Visit (INDEPENDENT_AMBULATORY_CARE_PROVIDER_SITE_OTHER): Payer: BLUE CROSS/BLUE SHIELD | Admitting: Obstetrics & Gynecology

## 2015-11-02 ENCOUNTER — Encounter: Payer: Self-pay | Admitting: Obstetrics & Gynecology

## 2015-11-02 VITALS — BP 126/67 | HR 67 | Ht 67.0 in | Wt 207.0 lb

## 2015-11-02 DIAGNOSIS — N83202 Unspecified ovarian cyst, left side: Secondary | ICD-10-CM | POA: Diagnosis not present

## 2015-11-02 NOTE — Patient Instructions (Signed)
Diagnostic Laparoscopy A diagnostic laparoscopy is a procedure to diagnose diseases in the abdomen. During the procedure, a thin, lighted, pencil-sized instrument called a laparoscope is inserted into the abdomen through an incision. The laparoscope allows your health care provider to look at the organs inside your body. LET [facility] CARE PROVIDER KNOW ABOUT:  Any allergies you have.  All medicines you are taking, including vitamins, herbs, eye drops, creams, and over-the-counter medicines.  Previous problems you or members of your family have had with the use of anesthetics.  Any blood disorders you have.  Previous surgeries you have had.  Medical conditions you have. RISKS AND COMPLICATIONS  Generally, this is a safe procedure. However, problems can occur, which may include:  Infection.  Bleeding.  Damage to other organs.  Allergic reaction to the anesthetics used during the procedure. BEFORE THE PROCEDURE  Do not eat or drink anything after midnight on the night before the procedure or as directed by your health care provider.  Ask your health care provider about:  Changing or stopping your regular medicines.  Taking medicines such as aspirin and ibuprofen. These medicines can thin your blood. Do not take these medicines before your procedure if your health care provider instructs you not to.  Plan to have someone take you home after the procedure. PROCEDURE  You may be given a medicine to help you relax (sedative).  You will be given a medicine to make you sleep (general anesthetic).  Your abdomen will be inflated with a gas. This will make your organs easier to see.  Small incisions will be made in your abdomen.  A laparoscope and other small instruments will be inserted into the abdomen through the incisions.  A tissue sample may be removed from an organ in the abdomen for examination.  The instruments will be removed from the abdomen.  The gas will be  released.  The incisions will be closed with stitches (sutures). AFTER THE PROCEDURE  Your blood pressure, heart rate, breathing rate, and blood oxygen level will be monitored often until the medicines you were given have worn off.   This information is not intended to replace advice given to you by your health care provider. Make sure you discuss any questions you have with your health care provider.   Document Released: 07/22/2000 Document Revised: 01/04/2015 Document Reviewed: 11/26/2013 Elsevier Interactive Patient Education [employer].

## 2015-11-02 NOTE — Progress Notes (Signed)
Danielle Evans is an 25 y.o. female. [id number] No LMP recorded. Patient is not currently having periods (Reason: IUD). H/o pelvic pain and left ovarian cyst, f/u from MAU visit. Her pain is worse when she has vaginal bleeding, left sided. Her menses are not heavy with Mirena in place but still last for 7 days  Pertinent Gynecological History: Menses: flow is light Bleeding:   Contraception: IUD DES exposure: unknown Blood transfusions: none Sexually transmitted diseases: no past history  Last pap: normal Date: 2015    Menstrual History:  No LMP recorded. Patient is not currently having periods (Reason: IUD).    Past Medical History  Diagnosis Date  . Bipolar 1 disorder ([place])   . Attention and concentration deficit   . Exposure to STD   . 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 Vaginal Pap smear, abnormal 2014    Past Surgical History  Procedure Laterality Date  . Tonsilectomy, adenoidectomy, bilateral myringotomy and tubes    . Nasal sinus surgery    . Tonsillectomy      Family History  Problem Relation Age of Onset  . Heart disease Father   . Cancer Paternal Aunt     breast   . Cancer Maternal Grandmother     breast  . Parkinsonism Maternal Grandfather     Social History:  reports that she quit smoking about 4 years ago. She has never used smokeless tobacco. She reports that she drinks about 6.6 oz of alcohol per week. She reports that she does not use illicit drugs.  Allergies:  Allergies  Allergen Reactions  . Ceclor [Cefaclor]      (Not in a hospital admission)  Review of Systems  Constitutional: Negative.   Gastrointestinal: Negative.   Genitourinary: Negative for dysuria.       Left LLQ pelvic pain    Blood pressure 126/67, pulse 67, height 5\' 7"  (1.702 m), weight 207 lb (93.895 kg). Physical Exam  Vitals reviewed. Constitutional: She appears well-developed. No distress.  Cardiovascular: Normal rate.   Respiratory: Effort normal.   GI: Soft. She exhibits no distension and no mass. There is no tenderness.  Genitourinary: Vagina normal and uterus normal. No vaginal discharge found.  Uterus normal no masses or tenderness  Neurological: She is alert.  Psychiatric: She has a normal mood and affect. Her behavior is normal.    Results for orders placed or performed during the hospital encounter of 11/01/15 (from the past 24 hour(s))  Urinalysis, Routine w reflex microscopic (not at [facility])     Status: None   Collection Time: 11/01/15  4:52 PM  Result Value Ref Range   Color, Urine YELLOW YELLOW   APPearance CLEAR CLEAR   Specific Gravity, Urine 1.020 1.005 - 1.030   pH 5.5 5.0 - 8.0   Glucose, UA NEGATIVE NEGATIVE mg/dL   Hgb urine dipstick NEGATIVE NEGATIVE   Bilirubin Urine NEGATIVE NEGATIVE   Ketones, ur NEGATIVE NEGATIVE mg/dL   Protein, ur NEGATIVE NEGATIVE mg/dL   Nitrite NEGATIVE NEGATIVE   Leukocytes, UA NEGATIVE NEGATIVE  Pregnancy, urine POC     Status: None   Collection Time: 11/01/15  5:04 PM  Result Value Ref Range   Preg Test, Ur NEGATIVE NEGATIVE    US Transvaginal Non-ob  11/01/2015  CLINICAL DATA:  Severe pelvic pain, 5 days duration. Follow-up left ovarian cyst with enhancing mural nodule. EXAM: TRANSVAGINAL ULTRASOUND OF PELVIS DOPPLER ULTRASOUND OF OVARIES TECHNIQUE: transvaginal ultrasound examinations of the pelvis were  performed. Color and duplex Doppler ultrasound was utilized to evaluate blood flow to the ovaries. COMPARISON:  Ultrasound 10/03/2015.  MRI 10/11/2015. FINDINGS: Uterus Measurements: 7.0 x 3.0 x 3.7 cm. No fibroids or other mass visualized. Endometrium Thickness: 3.4 mm.  IUD well positioned. Right ovary Measurements: 2.9 x 1.6 x 1.6 cm. Normal appearance/no adnexal mass. Normal follicular cyst. Normal color flow. Left ovary Measurements: Overall measures 4.6 x 2.1 x 2.8 cm. 3.6 x 2.2 x 2.5 cm cyst with internal 1cm nodule, unchanged from the previous imaging. No large vessel blood  flow demonstrated within the nodule. Other findings No abnormal free fluid. IMPRESSION: No change of compared to the 2 previous exams. Cystic abnormality of the left ovary measuring 3.6 x 2.2 x 2.5 cm. 1 cm mural nodule. As discussed on the previous studies including MRI, this lesion is suspicious for an ovarian neoplasm. This could be benign or borderline malignant. Surgical consultation suggested. No acute finding to explain change in patient's symptoms. Electronically Signed   By: Nelson Chimes M.D.   On: 11/01/2015 20:28   [place] Art/ven Flow Abd Pelv Doppler  11/01/2015  CLINICAL DATA:  Severe pelvic pain, 5 days duration. Follow-up left ovarian cyst with enhancing mural nodule. EXAM: TRANSVAGINAL ULTRASOUND OF PELVIS DOPPLER ULTRASOUND OF OVARIES TECHNIQUE: transvaginal ultrasound examinations of the pelvis were performed. Color and duplex Doppler ultrasound was utilized to evaluate blood flow to the ovaries. COMPARISON:  Ultrasound 10/03/2015.  MRI 10/11/2015. FINDINGS: Uterus Measurements: 7.0 x 3.0 x 3.7 cm. No fibroids or other mass visualized. Endometrium Thickness: 3.4 mm.  IUD well positioned. Right ovary Measurements: 2.9 x 1.6 x 1.6 cm. Normal appearance/no adnexal mass. Normal follicular cyst. Normal color flow. Left ovary Measurements: Overall measures 4.6 x 2.1 x 2.8 cm. 3.6 x 2.2 x 2.5 cm cyst with internal 1cm nodule, unchanged from the previous imaging. No large vessel blood flow demonstrated within the nodule. Other findings No abnormal free fluid. IMPRESSION: No change of compared to the 2 previous exams. Cystic abnormality of the left ovary measuring 3.6 x 2.2 x 2.5 cm. 1 cm mural nodule. As discussed on the previous studies including MRI, this lesion is suspicious for an ovarian neoplasm. This could be benign or borderline malignant. Surgical consultation suggested. No acute finding to explain change in patient's symptoms. Electronically Signed   By: Nelson Chimes M.D.   On: 11/01/2015 20:28     Assessment/Plan: Pelvic pain with Mirena in place Left complex ovarian cyst Plan Dx laparoscopy, possible cystectomy as OP. Marland Kitchen  The risks of surgery were discussed in detail with the patient including but not limited to: bleeding which may require transfusion or reoperation; infection which may require prolonged hospitalization or re-hospitalization and antibiotic therapy; injury to bowel, bladder, ureters and major vessels or other surrounding organs; need for additional procedures including laparotomy; thromboembolic phenomenon, incisional problems and other postoperative or anesthesia complications.  Patient was told that the likelihood that her condition and symptoms will be treated effectively with this surgical management was very high; the postoperative expectations were also discussed in detail. The patient also understands the alternative treatment options which were discussed in full. All questions were answered.  She was told that she will be contacted by our surgical scheduler regarding the time and date of her surgery; routine preoperative instructions of having nothing to eat or drink after midnight on the day prior to surgery and also coming to the hospital 1.5 hours prior to her time of surgery were also emphasized.  She was told she may be called for a preoperative appointment about a week prior to surgery and will be given further preoperative instructions at that visit. Printed patient education handouts about the procedure were given to the patient to review at home.    Tresa Jolley 11/02/2015, 3:31 PM

## 2015-11-07 ENCOUNTER — Telehealth: Payer: Self-pay | Admitting: *Deleted

## 2015-11-07 MED ORDER — IBUPROFEN 800 MG PO TABS: 800.0000 mg | ORAL_TABLET | Freq: Three times a day (TID) | ORAL | Status: DC | PRN

## 2015-11-07 NOTE — Telephone Encounter (Signed)
Pt left message yesterday stating that she is awaiting surgery and was told she could call for refill of pain medication if necessary. She requests refill. (oxycodone 5/325 mg #20 given on 7/5 @ MAU)  I called pt after consult w/Dr. Harolyn Rutherford.  I advised pt that the doctor would like her to take Ibuprofen 800 mg q8h prn (Rx sent to pharmacy) as well as Tylenol OTC per label instructions. She will be notified of her surgery appt once it is scheduled.  Pt voiced understanding of instructions given.

## 2015-11-08 ENCOUNTER — Other Ambulatory Visit: Payer: Self-pay | Admitting: Obstetrics & Gynecology

## 2015-11-14 NOTE — Patient Instructions (Signed)
Your procedure is scheduled on:  Tuesday, November 21, 2015  Enter through the Main Entrance of [facility] at:  11:30 AM  Pick up the phone at the desk and dial [phone].  Call this number if you have problems the morning of surgery: [phone].  Remember: Do NOT eat food:  After Midnight Monday  Do NOT drink clear liquids after:  9:00 AM day of surgery  Take these medicines the morning of surgery with a SIP OF WATER:  Clonazepam if needed  Do NOT wear jewelry (body piercing), metal hair clips/bobby pins, make-up, or nail polish. Do NOT wear lotions, powders, or perfumes.  You may wear deodorant. Do NOT shave for 48 hours prior to surgery. Do NOT bring valuables to the hospital. Contacts, dentures, or bridgework may not be worn into surgery.  Leave suitcase in car.  After surgery it may be brought to your room.  For patients admitted to the hospital, checkout time is 11:00 AM the day of discharge.

## 2015-11-15 ENCOUNTER — Encounter (HOSPITAL_COMMUNITY)
Admission: RE | Admit: 2015-11-15 | Discharge: 2015-11-15 | Disposition: A | Discharge status: Home / Self Care | Payer: BLUE CROSS/BLUE SHIELD | Source: Ambulatory Visit | Attending: Internal Medicine | Admitting: Internal Medicine

## 2015-11-15 ENCOUNTER — Encounter (HOSPITAL_COMMUNITY): Payer: Self-pay

## 2015-11-15 DIAGNOSIS — Z01812 Encounter for preprocedural laboratory examination: Secondary | ICD-10-CM | POA: Insufficient documentation

## 2015-11-15 HISTORY — DX: Headache: R51

## 2015-11-15 HISTORY — DX: Pneumonia, unspecified organism: J18.9

## 2015-11-15 HISTORY — DX: Gastro-esophageal reflux disease without esophagitis: K21.9

## 2015-11-15 HISTORY — DX: Headache, unspecified: R51.9

## 2015-11-15 HISTORY — DX: Sleep apnea, unspecified: G47.30

## 2015-11-15 LAB — CBC
HCT: 44.1 % (ref 36.0–46.0)
HEMOGLOBIN: 15 g/dL (ref 12.0–15.0)
MCH: 30.9 pg (ref 26.0–34.0)
MCHC: 34 g/dL (ref 30.0–36.0)
MCV: 90.9 fL (ref 78.0–100.0)
Platelets: 287 10*3/uL (ref 150–400)
RBC: 4.85 MIL/uL (ref 3.87–5.11)
RDW: 12.7 % (ref 11.5–15.5)
WBC: 10 10*3/uL (ref 4.0–10.5)

## 2015-11-15 LAB — TYPE AND SCREEN
ABO/RH(D): A POS
Antibody Screen: NEGATIVE

## 2015-11-15 LAB — ABO/RH: ABO/RH(D): A POS

## 2015-11-17 ENCOUNTER — Telehealth: Payer: Self-pay | Admitting: General Practice

## 2015-11-17 DIAGNOSIS — R102 Pelvic and perineal pain: Secondary | ICD-10-CM

## 2015-11-17 MED ORDER — TRAMADOL HCL 50 MG PO TABS: 50.0000 mg | ORAL_TABLET | Freq: Four times a day (QID) | ORAL | Status: DC | PRN

## 2015-11-17 NOTE — Telephone Encounter (Signed)
Patient called and left message stating she went to her pre op visit recently and there was something on the paper that she doesn't agree with that wasn't discussed with the doctor. Patient states she also has a question about a medication.

## 2015-11-17 NOTE — Telephone Encounter (Signed)
Called patient stating I am returning your phone call. Patient states at her pre op visit the consent form talked about removing her ovary & she definitely does not want that done and have only one ovary at 24. Told patient that is not listed as a procedure of her surgery and it was likely included as a possibility. Told patient that if Dr Roselie Awkward is doing surgery and her ovary looks bad he would have to remove it, he couldn't just leave it there. Told patient because of those instances they have to include removal as a possibility but a removal of the ovary isn't planned. Told patient we do not remove ovaries unless absolutely necessary. Patient verbalized understanding & states she also wanted to know if she could get something for pain. Patient states she has been taking ibuprofen but cannot anymore. Patient states the ibuprofen literally makes her sick & makes her throw up blood. Patient states she takes tylenol but still has some pain. Per Dr Roselie Awkward, may prescribe tramadol #20. Informed patient. Patient verbalized understanding and asked about refill on her klonopin. Patient states she isn't able to get in with her doctor that prescribes the medication before her surgery. Patient wants to know if Dr Roselie Awkward would be willing to prescribe even just a couple klonopin to take prior to surgery. Told patient I would ask Dr Roselie Awkward and if he is fine with that and if so I will send it to the pharmacy with her tramadol. Patient verbalized understanding & had no other questions. Dr Roselie Awkward declined refill

## 2015-11-21 ENCOUNTER — Ambulatory Visit (HOSPITAL_COMMUNITY): Payer: BLUE CROSS/BLUE SHIELD | Admitting: Anesthesiology

## 2015-11-21 ENCOUNTER — Encounter (HOSPITAL_COMMUNITY): Payer: Self-pay | Admitting: *Deleted

## 2015-11-21 ENCOUNTER — Ambulatory Visit (HOSPITAL_COMMUNITY)
Admission: RE | Admit: 2015-11-21 | Discharge: 2015-11-21 | Disposition: A | Discharge status: Home / Self Care | Payer: BLUE CROSS/BLUE SHIELD | Source: Ambulatory Visit | Attending: Obstetrics & Gynecology | Admitting: Obstetrics & Gynecology

## 2015-11-21 ENCOUNTER — Encounter (HOSPITAL_COMMUNITY)
Admission: RE | Disposition: A | Discharge status: Home / Self Care | Payer: Self-pay | Source: Ambulatory Visit | Attending: Obstetrics & Gynecology

## 2015-11-21 DIAGNOSIS — Z888 Allergy status to other drugs, medicaments and biological substances status: Secondary | ICD-10-CM | POA: Diagnosis not present

## 2015-11-21 DIAGNOSIS — D282 Benign neoplasm of uterine tubes and ligaments: Secondary | ICD-10-CM | POA: Diagnosis not present

## 2015-11-21 DIAGNOSIS — F988 Other specified behavioral and emotional disorders with onset usually occurring in childhood and adolescence: Secondary | ICD-10-CM | POA: Diagnosis not present

## 2015-11-21 DIAGNOSIS — K219 Gastro-esophageal reflux disease without esophagitis: Secondary | ICD-10-CM | POA: Diagnosis not present

## 2015-11-21 DIAGNOSIS — J45909 Unspecified asthma, uncomplicated: Secondary | ICD-10-CM | POA: Diagnosis not present

## 2015-11-21 DIAGNOSIS — Z87891 Personal history of nicotine dependence: Secondary | ICD-10-CM | POA: Insufficient documentation

## 2015-11-21 DIAGNOSIS — N7011 Chronic salpingitis: Secondary | ICD-10-CM | POA: Diagnosis not present

## 2015-11-21 DIAGNOSIS — R102 Pelvic and perineal pain: Secondary | ICD-10-CM | POA: Diagnosis not present

## 2015-11-21 DIAGNOSIS — F319 Bipolar disorder, unspecified: Secondary | ICD-10-CM | POA: Insufficient documentation

## 2015-11-21 DIAGNOSIS — N83202 Unspecified ovarian cyst, left side: Secondary | ICD-10-CM | POA: Diagnosis present

## 2015-11-21 DIAGNOSIS — G4730 Sleep apnea, unspecified: Secondary | ICD-10-CM | POA: Insufficient documentation

## 2015-11-21 HISTORY — PX: LAPAROSCOPIC UNILATERAL SALPINGECTOMY: SHX5934

## 2015-11-21 HISTORY — PX: LAPAROSCOPY: SHX197

## 2015-11-21 LAB — PREGNANCY, URINE: Preg Test, Ur: NEGATIVE

## 2015-11-21 SURGERY — LAPAROSCOPY, DIAGNOSTIC
Anesthesia: General | Site: Abdomen

## 2015-11-21 MED ORDER — FENTANYL CITRATE (PF) 100 MCG/2ML IJ SOLN
25.0000 ug | INTRAMUSCULAR | Status: DC | PRN
  Administered 2015-11-21 (×2): 50 ug via INTRAVENOUS

## 2015-11-21 MED ORDER — SCOPOLAMINE 1 MG/3DAYS TD PT72
MEDICATED_PATCH | TRANSDERMAL | Status: AC
  Administered 2015-11-21: 1.5 mg via TRANSDERMAL
  Filled 2015-11-21: qty 1

## 2015-11-21 MED ORDER — PROMETHAZINE HCL 25 MG/ML IJ SOLN: 6.2500 mg | INTRAMUSCULAR | Status: DC | PRN

## 2015-11-21 MED ORDER — LIDOCAINE HCL (CARDIAC) 20 MG/ML IV SOLN
INTRAVENOUS | Status: AC
  Filled 2015-11-21: qty 5

## 2015-11-21 MED ORDER — PROPOFOL 10 MG/ML IV BOLUS
INTRAVENOUS | Status: DC | PRN
  Administered 2015-11-21: 200 mg via INTRAVENOUS

## 2015-11-21 MED ORDER — FENTANYL CITRATE (PF) 100 MCG/2ML IJ SOLN
INTRAMUSCULAR | Status: DC | PRN
  Administered 2015-11-21: 100 ug via INTRAVENOUS
  Administered 2015-11-21: 50 ug via INTRAVENOUS
  Administered 2015-11-21: 100 ug via INTRAVENOUS

## 2015-11-21 MED ORDER — ONDANSETRON HCL 4 MG/2ML IJ SOLN
INTRAMUSCULAR | Status: DC | PRN
  Administered 2015-11-21: 4 mg via INTRAVENOUS

## 2015-11-21 MED ORDER — MIDAZOLAM HCL 2 MG/2ML IJ SOLN
INTRAMUSCULAR | Status: AC
  Filled 2015-11-21: qty 2

## 2015-11-21 MED ORDER — DEXAMETHASONE SODIUM PHOSPHATE 10 MG/ML IJ SOLN
INTRAMUSCULAR | Status: DC | PRN
  Administered 2015-11-21: 4 mg via INTRAVENOUS

## 2015-11-21 MED ORDER — BUPIVACAINE HCL (PF) 0.25 % IJ SOLN
INTRAMUSCULAR | Status: AC
  Filled 2015-11-21: qty 30

## 2015-11-21 MED ORDER — LIDOCAINE HCL (CARDIAC) 20 MG/ML IV SOLN
INTRAVENOUS | Status: DC | PRN
  Administered 2015-11-21: 100 mg via INTRAVENOUS

## 2015-11-21 MED ORDER — FENTANYL CITRATE (PF) 100 MCG/2ML IJ SOLN
INTRAMUSCULAR | Status: AC
  Administered 2015-11-21: 50 ug via INTRAVENOUS
  Filled 2015-11-21: qty 2

## 2015-11-21 MED ORDER — ROCURONIUM BROMIDE 100 MG/10ML IV SOLN
INTRAVENOUS | Status: AC
  Filled 2015-11-21: qty 1

## 2015-11-21 MED ORDER — PROPOFOL 10 MG/ML IV BOLUS
INTRAVENOUS | Status: AC
  Filled 2015-11-21: qty 20

## 2015-11-21 MED ORDER — GLYCOPYRROLATE 0.2 MG/ML IJ SOLN
INTRAMUSCULAR | Status: DC | PRN
  Administered 2015-11-21 (×2): 0.2 mg via INTRAVENOUS

## 2015-11-21 MED ORDER — NEOSTIGMINE METHYLSULFATE 10 MG/10ML IV SOLN
INTRAVENOUS | Status: AC
  Filled 2015-11-21: qty 1

## 2015-11-21 MED ORDER — ROCURONIUM BROMIDE 100 MG/10ML IV SOLN
INTRAVENOUS | Status: DC | PRN
  Administered 2015-11-21: 40 mg via INTRAVENOUS
  Administered 2015-11-21: 10 mg via INTRAVENOUS

## 2015-11-21 MED ORDER — SCOPOLAMINE 1 MG/3DAYS TD PT72
1.0000 | MEDICATED_PATCH | Freq: Once | TRANSDERMAL | Status: DC
  Administered 2015-11-21: 1.5 mg via TRANSDERMAL

## 2015-11-21 MED ORDER — HYDROMORPHONE HCL 1 MG/ML IJ SOLN
INTRAMUSCULAR | Status: AC
  Filled 2015-11-21: qty 1

## 2015-11-21 MED ORDER — BUPIVACAINE HCL (PF) 0.25 % IJ SOLN
INTRAMUSCULAR | Status: DC | PRN
  Administered 2015-11-21 (×2): 5 mL

## 2015-11-21 MED ORDER — GLYCOPYRROLATE 0.2 MG/ML IJ SOLN
INTRAMUSCULAR | Status: AC
  Filled 2015-11-21: qty 2

## 2015-11-21 MED ORDER — IBUPROFEN 600 MG PO TABS: 600.0000 mg | ORAL_TABLET | Freq: Four times a day (QID) | ORAL | 0 refills | Status: DC | PRN

## 2015-11-21 MED ORDER — MIDAZOLAM HCL 5 MG/5ML IJ SOLN
INTRAMUSCULAR | Status: DC | PRN
  Administered 2015-11-21: 2 mg via INTRAVENOUS

## 2015-11-21 MED ORDER — LACTATED RINGERS IV SOLN: INTRAVENOUS | Status: DC

## 2015-11-21 MED ORDER — KETOROLAC TROMETHAMINE 30 MG/ML IJ SOLN
INTRAMUSCULAR | Status: DC | PRN
  Administered 2015-11-21: 30 mg via INTRAVENOUS

## 2015-11-21 MED ORDER — LACTATED RINGERS IV SOLN
INTRAVENOUS | Status: DC
  Administered 2015-11-21 (×2): via INTRAVENOUS

## 2015-11-21 MED ORDER — OXYCODONE-ACETAMINOPHEN 5-325 MG PO TABS: 1.0000 | ORAL_TABLET | Freq: Four times a day (QID) | ORAL | 0 refills | Status: DC | PRN

## 2015-11-21 MED ORDER — ONDANSETRON HCL 4 MG/2ML IJ SOLN
INTRAMUSCULAR | Status: AC
  Filled 2015-11-21: qty 2

## 2015-11-21 MED ORDER — HYDROMORPHONE HCL 1 MG/ML IJ SOLN
INTRAMUSCULAR | Status: DC | PRN
  Administered 2015-11-21: 1 mg via INTRAVENOUS

## 2015-11-21 MED ORDER — DEXAMETHASONE SODIUM PHOSPHATE 10 MG/ML IJ SOLN
INTRAMUSCULAR | Status: AC
  Filled 2015-11-21: qty 1

## 2015-11-21 MED ORDER — FENTANYL CITRATE (PF) 250 MCG/5ML IJ SOLN
INTRAMUSCULAR | Status: AC
  Filled 2015-11-21: qty 5

## 2015-11-21 MED ORDER — NEOSTIGMINE METHYLSULFATE 10 MG/10ML IV SOLN
INTRAVENOUS | Status: DC | PRN
  Administered 2015-11-21: 3 mg via INTRAVENOUS

## 2015-11-21 SURGICAL SUPPLY — 28 items
CABLE HIGH FREQUENCY MONO STRZ (ELECTRODE) IMPLANT
CLOSURE WOUND 1/2 X4 (GAUZE/BANDAGES/DRESSINGS)
CLOTH BEACON ORANGE TIMEOUT ST (SAFETY) ×5 IMPLANT
DRSG COVADERM PLUS 2X2 (GAUZE/BANDAGES/DRESSINGS) ×10 IMPLANT
DRSG OPSITE POSTOP 3X4 (GAUZE/BANDAGES/DRESSINGS) ×5 IMPLANT
DURAPREP 26ML APPLICATOR (WOUND CARE) ×5 IMPLANT
GLOVE BIO SURGEON STRL SZ 6.5 (GLOVE) ×4 IMPLANT
GLOVE BIO SURGEONS STRL SZ 6.5 (GLOVE) ×1
GLOVE BIOGEL PI IND STRL 7.0 (GLOVE) ×6 IMPLANT
GLOVE BIOGEL PI INDICATOR 7.0 (GLOVE) ×4
GOWN STRL REUS W/TWL LRG LVL3 (GOWN DISPOSABLE) ×10 IMPLANT
LIQUID BAND (GAUZE/BANDAGES/DRESSINGS) ×5 IMPLANT
NEEDLE INSUFFLATION 120MM (ENDOMECHANICALS) ×5 IMPLANT
NS IRRIG 1000ML POUR BTL (IV SOLUTION) ×5 IMPLANT
PACK LAPAROSCOPY BASIN (CUSTOM PROCEDURE TRAY) ×5 IMPLANT
PAD TRENDELENBURG POSITION (MISCELLANEOUS) ×5 IMPLANT
POUCH SPECIMEN RETRIEVAL 10MM (ENDOMECHANICALS) ×5 IMPLANT
SET IRRIG TUBING LAPAROSCOPIC (IRRIGATION / IRRIGATOR) IMPLANT
SHEARS HARMONIC ACE PLUS 36CM (ENDOMECHANICALS) IMPLANT
SLEEVE XCEL OPT CAN 5 100 (ENDOMECHANICALS) ×5 IMPLANT
STRIP CLOSURE SKIN 1/2X4 (GAUZE/BANDAGES/DRESSINGS) IMPLANT
SUT VICRYL 0 UR6 27IN ABS (SUTURE) ×5 IMPLANT
SUT VICRYL 4-0 PS2 18IN ABS (SUTURE) ×5 IMPLANT
TOWEL OR 17X24 6PK STRL BLUE (TOWEL DISPOSABLE) ×10 IMPLANT
TRAY FOLEY BAG SILVER LF 16FR (SET/KITS/TRAYS/PACK) ×5 IMPLANT
TROCAR XCEL DIL TIP R 11M (ENDOMECHANICALS) ×5 IMPLANT
WARMER LAPAROSCOPE (MISCELLANEOUS) ×5 IMPLANT
WATER STERILE IRR 1000ML POUR (IV SOLUTION) ×5 IMPLANT

## 2015-11-21 NOTE — Anesthesia Postprocedure Evaluation (Signed)
Anesthesia Post Note  Patient: Danielle Evans  Procedure(s) Performed: Procedure(s) (LRB): LAPAROSCOPY DIAGNOSTIC (N/A) LAPAROSCOPIC UNILATERAL SALPINGECTOMY (Left)  Patient location during evaluation: PACU Anesthesia Type: General Level of consciousness: awake Pain management: pain level controlled Vital Signs Assessment: post-procedure vital signs reviewed and stable Respiratory status: spontaneous breathing Cardiovascular status: stable Postop Assessment: no signs of nausea or vomiting Anesthetic complications: no     Last Vitals:  Vitals:   11/21/15 1516 11/21/15 1536  BP: 121/66   Pulse: 73 64  Resp: (!) 23 (!) 27  Temp:      Last Pain:  Vitals:   11/21/15 1536  TempSrc:   PainSc: 4    Pain Goal: Patients Stated Pain Goal: 5 (11/21/15 1135)               [place]

## 2015-11-21 NOTE — Anesthesia Procedure Notes (Signed)
Procedure Name: Intubation Date/Time: 11/21/2015 1:18 PM Performed by: Jonna Munro Pre-anesthesia Checklist: Patient identified, Patient being monitored, Timeout performed, Emergency Drugs available and Suction available Patient Re-evaluated:Patient Re-evaluated prior to inductionOxygen Delivery Method: Circle System Utilized Preoxygenation: Pre-oxygenation with 100% oxygen Intubation Type: IV induction Ventilation: Mask ventilation without difficulty Laryngoscope Size: Mac and 3 Grade View: Grade I Tube type: Oral Tube size: 7.0 mm Number of attempts: 1 Airway Equipment and Method: stylet Placement Confirmation: ETT inserted through vocal cords under direct vision,  positive ETCO2 and breath sounds checked- equal and bilateral Secured at: 21 cm Tube secured with: Tape Dental Injury: Teeth and Oropharynx as per pre-operative assessment

## 2015-11-21 NOTE — H&P (Signed)
Woodroe Mode, MD  Obstetrics/Gynecology  Expand All Collapse All   Danielle Evans is an 25 y.o. female. [id number] No LMP recorded. Patient is not currently having periods (Reason: IUD). H/o pelvic pain and left ovarian cyst, f/u from [facility] visit. Her pain is worse when she has vaginal bleeding, left sided. Her menses are not heavy with Mirena in place but still last for 7 days  Pertinent Gynecological History: Menses: flow is light Bleeding:   Contraception: IUD DES exposure: unknown Blood transfusions: none Sexually transmitted diseases: no past history  Last pap: normal Date: 2015                         Menstrual History:  No LMP recorded. Patient is not currently having periods (Reason: IUD).        Past Medical History  Diagnosis Date  . Bipolar 1 disorder ([place])   . Attention and concentration deficit   . Exposure to STD   . 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 Vaginal Pap smear, abnormal 2014    Past Surgical History  Procedure Laterality Date  . Tonsilectomy, adenoidectomy, bilateral myringotomy and tubes    . Nasal sinus surgery    . Tonsillectomy            Family History  Problem Relation Age of Onset  . Heart disease Father   . Cancer Paternal Aunt     breast   . Cancer Maternal Grandmother     breast  . Parkinsonism Maternal Grandfather     Social History:  reports that she quit smoking about 4 years ago. She has never used smokeless tobacco. She reports that she drinks about 6.6 oz of alcohol per week. She reports that she does not use illicit drugs.  Allergies:      Allergies  Allergen Reactions  . Ceclor [Cefaclor]        Review of Systems  Constitutional: Negative.   Gastrointestinal: Negative.   Genitourinary: Negative for dysuria.       Left LLQ pelvic pain   Blood pressure 120/72, pulse 65, temperature 98.3 F (36.8 C), temperature source Oral, resp. rate 20, SpO2 99  %.   Physical Exam   Vitals reviewed. Constitutional: She appears well-developed. No distress.  Cardiovascular: Normal rate.   Respiratory: Effort normal.  GI: Soft. She exhibits no distension and no mass. There is no tenderness.  Genitourinary: Vagina normal and uterus normal. No vaginal discharge found.  Uterus normal no masses or tenderness  Neurological: She is alert.  Psychiatric: She has a normal mood and affect. Her behavior is normal.    Lab Results Last 24 Hours       Results for orders placed or performed during the hospital encounter of 11/01/15 (from the past 24 hour(s))  Urinalysis, Routine w reflex microscopic (not at [facility])     Status: None   Collection Time: 11/01/15  4:52 PM  Result Value Ref Range   Color, Urine YELLOW YELLOW   APPearance CLEAR CLEAR   Specific Gravity, Urine 1.020 1.005 - 1.030   pH 5.5 5.0 - 8.0   Glucose, UA NEGATIVE NEGATIVE mg/dL   Hgb urine dipstick NEGATIVE NEGATIVE   Bilirubin Urine NEGATIVE NEGATIVE   Ketones, ur NEGATIVE NEGATIVE mg/dL   Protein, ur NEGATIVE NEGATIVE mg/dL   Nitrite NEGATIVE NEGATIVE   Leukocytes, UA NEGATIVE NEGATIVE  Pregnancy, urine POC     Status: None  Collection Time: 11/01/15  5:04 PM  Result Value Ref Range   Preg Test, Ur NEGATIVE NEGATIVE       Imaging Results (Last 48 hours)  US Transvaginal Non-ob  11/01/2015  CLINICAL DATA:  Severe pelvic pain, 5 days duration. Follow-up left ovarian cyst with enhancing mural nodule. EXAM: TRANSVAGINAL ULTRASOUND OF PELVIS DOPPLER ULTRASOUND OF OVARIES TECHNIQUE: transvaginal ultrasound examinations of the pelvis were performed. Color and duplex Doppler ultrasound was utilized to evaluate blood flow to the ovaries. COMPARISON:  Ultrasound 10/03/2015.  MRI 10/11/2015. FINDINGS: Uterus Measurements: 7.0 x 3.0 x 3.7 cm. No fibroids or other mass visualized. Endometrium Thickness: 3.4 mm.  IUD well positioned. Right ovary Measurements: 2.9 x 1.6 x 1.6  cm. Normal appearance/no adnexal mass. Normal follicular cyst. Normal color flow. Left ovary Measurements: Overall measures 4.6 x 2.1 x 2.8 cm. 3.6 x 2.2 x 2.5 cm cyst with internal 1cm nodule, unchanged from the previous imaging. No large vessel blood flow demonstrated within the nodule. Other findings No abnormal free fluid. IMPRESSION: No change of compared to the 2 previous exams. Cystic abnormality of the left ovary measuring 3.6 x 2.2 x 2.5 cm. 1 cm mural nodule. As discussed on the previous studies including MRI, this lesion is suspicious for an ovarian neoplasm. This could be benign or borderline malignant. Surgical consultation suggested. No acute finding to explain change in patient's symptoms. Electronically Signed   By: Nelson Chimes M.D.   On: 11/01/2015 20:28   [place] Art/ven Flow Abd Pelv Doppler  11/01/2015  CLINICAL DATA:  Severe pelvic pain, 5 days duration. Follow-up left ovarian cyst with enhancing mural nodule. EXAM: TRANSVAGINAL ULTRASOUND OF PELVIS DOPPLER ULTRASOUND OF OVARIES TECHNIQUE: transvaginal ultrasound examinations of the pelvis were performed. Color and duplex Doppler ultrasound was utilized to evaluate blood flow to the ovaries. COMPARISON:  Ultrasound 10/03/2015.  MRI 10/11/2015. FINDINGS: Uterus Measurements: 7.0 x 3.0 x 3.7 cm. No fibroids or other mass visualized. Endometrium Thickness: 3.4 mm.  IUD well positioned. Right ovary Measurements: 2.9 x 1.6 x 1.6 cm. Normal appearance/no adnexal mass. Normal follicular cyst. Normal color flow. Left ovary Measurements: Overall measures 4.6 x 2.1 x 2.8 cm. 3.6 x 2.2 x 2.5 cm cyst with internal 1cm nodule, unchanged from the previous imaging. No large vessel blood flow demonstrated within the nodule. Other findings No abnormal free fluid. IMPRESSION: No change of compared to the 2 previous exams. Cystic abnormality of the left ovary measuring 3.6 x 2.2 x 2.5 cm. 1 cm mural nodule. As discussed on the previous studies including MRI, this  lesion is suspicious for an ovarian neoplasm. This could be benign or borderline malignant. Surgical consultation suggested. No acute finding to explain change in patient's symptoms. Electronically Signed   By: Nelson Chimes M.D.   On: 11/01/2015 20:28     Assessment/Plan: Pelvic pain with Mirena in place Left complex ovarian cyst Plan Dx laparoscopy, possible cystectomy as OP. Possible oophorectomy .  The risks of surgery were discussed in detail with the patient including but not limited to: bleeding which may require transfusion or reoperation; infection which may require prolonged hospitalization or re-hospitalization and antibiotic therapy; injury to bowel, bladder, ureters and major vessels or other surrounding organs; need for additional procedures including laparotomy; thromboembolic phenomenon, incisional problems and other postoperative or anesthesia complications.  Patient was told that the likelihood that her condition and symptoms will be treated effectively with this surgical management was very high; the postoperative expectations were also discussed in detail.  The patient also understands the alternative treatment options which were discussed in full. All questions were answered.      Woodroe Mode, MD 11/21/2015 11:48 AM

## 2015-11-21 NOTE — Op Note (Addendum)
Danielle Evans PROCEDURE DATE: 11/21/2015  PREOPERATIVE DIAGNOSIS: Left ovarian cyst and pelvic pain POSTOPERATIVE DIAGNOSIS: Left hydrosalpinx PROCEDURE: Laparoscopic left salpingectomy  SURGEON:  Dr. Emeterio Reeve ANESTHESIOLOGIST: Lyn Hollingshead, MD Anesthesiologist: Lyn Hollingshead, MD CRNA: Jonna Munro, CRNA  INDICATIONS: 25 y.o. [phone] at Unknown here with the preoperative diagnoses as listed above.  Please refer to preoperative notes for more details. Patient was counseled regarding need for laparoscopic cystectomy and possible oophoretomy. Risks of surgery including bleeding which may require transfusion or reoperation, infection, injury to bowel or other surrounding organs, need for additional procedures including laparotomy and other postoperative/anesthesia complications were explained to patient.  Written informed consent was obtained.  FINDINGS:  Dilated left fallopian tube . Small normal appearing uterus, normal right fallopian tube, right ovary and left ovary.  ANESTHESIA: General INTRAVENOUS FLUIDS: 1000 ml ESTIMATED BLOOD LOSS: <5 ml ml URINE OUTPUT: 100 ml SPECIMENS: Left fallopian tube COMPLICATIONS: None immediate  PROCEDURE IN DETAIL:  The patient was taken to the operating room where general anesthesia was administered and was found to be adequate.  She was placed in the dorsal lithotomy position, and was prepped and draped in a sterile manner.  A Foley catheter was inserted into her bladder and attached to constant drainage.    After an adequate timeout was performed, attention was turned to the abdomen where an umbilical incision was made with the scalpel.  The veress needle was placed and CO2 was insufflated. The 11-mm trocar and sleeve were then advanced without difficulty  A survey of the patient's pelvis and abdomen revealed the findings above.  Two 5-mm left lower quadrant ports were then placed under direct visualization. There were a few adhesion of  sigmoid colon to the left sidewall  Attention was then turned to the left fallopian tube which was grasped and ligated from the underlying mesosalpinx and uterine attachment using the Harmonic instrument.  Good hemostasis was noted.  The specimen was placed in an EndoCatch bag and removed from the abdomen intact.  The abdomen was desufflated, and all instruments were removed.  The fascial incision of the 11-mm site was reapproximated with a 0 Vicryl figure-of-eight stitch; and all skin incisions were closed with 4-0 Vicryl and Liquiband. The patient tolerated the procedure well.  All instruments, needles, and sponge counts were correct x 2. The patient was taken to the recovery room in stable condition.   The patient will be discharged to home as per PACU criteria.  Routine postoperative instructions given.  She was prescribed Percocet, Ibuprofen  She will follow up in the clinic in about 2-3 weeks for postoperative evaluation.  Emeterio Reeve, MD, [place] Attending [place], [place] 11/21/2015 2:38 PM

## 2015-11-21 NOTE — Transfer of Care (Signed)
Immediate Anesthesia Transfer of Care Note  Patient: Danielle Evans  Procedure(s) Performed: Procedure(s): LAPAROSCOPY DIAGNOSTIC (N/A) LAPAROSCOPIC UNILATERAL SALPINGECTOMY (Left)  Patient Location: PACU  Anesthesia Type:General  Level of Consciousness: awake, alert  and oriented  Airway & Oxygen Therapy: Patient Spontanous Breathing and Patient connected to nasal cannula oxygen  Post-op Assessment: Report given to RN and Post -op Vital signs reviewed and stable  Post vital signs: Reviewed and stable  Last Vitals:  Vitals:   11/21/15 1135  BP: 120/72  Pulse: 65  Resp: 20  Temp: 36.8 C    Last Pain:  Vitals:   11/21/15 1135  TempSrc: Oral  PainSc: 7       Patients Stated Pain Goal: 5 ([id number])  Complications: No apparent anesthesia complications

## 2015-11-21 NOTE — Discharge Instructions (Addendum)
Salpingectomy, Care After Refer to this sheet in the next few weeks. These instructions provide you with information on caring for yourself after your procedure. Your health care provider may also give you more specific instructions. Your treatment has been planned according to current medical practices, but problems sometimes occur. Call your health care provider if you have any problems or questions after your procedure. WHAT TO EXPECT AFTER THE PROCEDURE After your procedure, it is typical to have the following:  Abdominal pain that can be controlled with pain medicine.  Vaginal spotting.  Tiredness. HOME CARE INSTRUCTIONS  Get plenty of rest and sleep.  Only take over-the-counter or prescription medicines as directed by your health care provider.  Keep incision areas clean and dry. Remove or change any bandages (dressings) only as directed by your health care provider.  You may resume your regular diet. Eat a well-balanced diet.  Drink enough fluids to keep your urine clear or pale yellow.  Limit exercise and activities as directed by your health care provider. Do not lift anything heavier than 5 lb (2.3 kg) until your health care provider approves.  Do not drive until your health care provider approves.  Do not have sexual intercourse until your health care provider says it is okay.  Take your temperature twice a day for the first week. Write those temperatures down.  Follow up with your health care provider as directed. SEEK MEDICAL CARE IF:  You have pain when you urinate.  You see pus coming out of the incision, or the incision is separating.  You have increasing abdominal pain.  You have swelling or redness in the incision area.  You develop a rash.  You feel lightheaded.  You have pain that is not controlled with medicine. SEEK IMMEDIATE MEDICAL CARE IF:  You develop a fever.  You have increasing abdominal pain.  You develop chest or leg pain.  You  develop shortness of breath.  You pass out.   This information is not intended to replace advice given to you by your health care provider. Make sure you discuss any questions you have with your health care provider.   Document Released: 07/20/2010 Document Revised: 05/06/2014 Document Reviewed: 10/07/2012 Elsevier Interactive Patient Education [employer].

## 2015-11-21 NOTE — Anesthesia Preprocedure Evaluation (Signed)
Anesthesia Evaluation  Patient identified by MRN, date of birth, ID band Patient awake    Reviewed: Allergy & Precautions, NPO status , Patient's Chart, lab work & pertinent test results  History of Anesthesia Complications Negative for: history of anesthetic complications  Airway Mallampati: II  TM Distance: >3 FB Neck ROM: Full    Dental  (+) Teeth Intact, Dental Advisory Given   Pulmonary asthma , sleep apnea , former smoker,    Pulmonary exam normal breath sounds clear to auscultation       Cardiovascular negative cardio ROS Normal cardiovascular exam Rhythm:Regular Rate:Normal     Neuro/Psych  Headaches, PSYCHIATRIC DISORDERS Bipolar Disorder    GI/Hepatic Neg liver ROS, GERD  Medicated,  Endo/Other  negative endocrine ROSObesity   Renal/GU negative Renal ROS     Musculoskeletal negative musculoskeletal ROS (+)   Abdominal   Peds  Hematology negative hematology ROS (+)   Anesthesia Other Findings Day of surgery medications reviewed with the patient.  Reproductive/Obstetrics Left complex ovarian cyst                             Anesthesia Physical Anesthesia Plan  ASA: II  Anesthesia Plan: General   Post-op Pain Management:    Induction: Intravenous  Airway Management Planned: Oral ETT  Additional Equipment:   Intra-op Plan:   Post-operative Plan: Extubation in OR  Informed Consent: I have reviewed the patients History and Physical, chart, labs and discussed the procedure including the risks, benefits and alternatives for the proposed anesthesia with the patient or authorized representative who has indicated his/her understanding and acceptance.   Dental advisory given  Plan Discussed with: CRNA  Anesthesia Plan Comments: (Risks/benefits of general anesthesia discussed with patient including risk of damage to teeth, lips, gum, and tongue, nausea/vomiting, allergic  reactions to medications, and the possibility of heart attack, stroke and death.  All patient questions answered.  Patient wishes to proceed.)        Anesthesia Quick Evaluation

## 2015-11-22 ENCOUNTER — Telehealth: Payer: Self-pay

## 2015-11-22 NOTE — Telephone Encounter (Signed)
Patient left a message requesting additional refill on percocet medication that she was given yesterday after her surgery. I advised patient we could not refill her pain medication at this time. She is not out of pain medication but she wanted to make sure she does not run out. I advised patient I could not authorized a refill at this time.

## 2015-11-24 ENCOUNTER — Telehealth: Payer: Self-pay | Admitting: *Deleted

## 2015-11-24 NOTE — Telephone Encounter (Signed)
I called Danielle Evans back and she reports she thinks it may be gas pain - feels like incision wants to explode. We discussed gas pain after her type of surgery is common- we discussed she should be ambulating, warm fluids, etc which she states she is doing and it is not helping. States she used her last pain pill. I advised her to come to [facility] for evaluation if she feels like her pain is severe.

## 2015-11-24 NOTE — Telephone Encounter (Signed)
Received a message left on nurse voicemail by patient on 11/24/15 at 1100.  Patient states she had surgery a few days ago.  States she is having pain behind her right incision that "feels like it is going to explode".  Requests a refill on pain medication.  Requests a return call to [phone].

## 2015-11-27 ENCOUNTER — Encounter (HOSPITAL_COMMUNITY): Payer: Self-pay | Admitting: Obstetrics & Gynecology

## 2015-11-29 ENCOUNTER — Ambulatory Visit (HOSPITAL_COMMUNITY): Admission: RE | Admit: 2015-11-29 | Payer: BLUE CROSS/BLUE SHIELD | Source: Ambulatory Visit

## 2015-12-04 ENCOUNTER — Ambulatory Visit: Payer: BLUE CROSS/BLUE SHIELD

## 2015-12-04 VITALS — BP 96/58 | HR 63 | Wt 214.0 lb

## 2015-12-04 DIAGNOSIS — Z5189 Encounter for other specified aftercare: Secondary | ICD-10-CM

## 2015-12-04 NOTE — Progress Notes (Signed)
Patient presented to office today for a wound check. Stated her wound site on abdominal has been very tender since having surgery. Dr.Arnold stated the site looks good at this time and the soreness should go away in time.  Patient stated she has been feeling overwhelmed since surgery and losing her child. Patient does suffer with bipolar disorder and ADHD. I have given her the names of several places where she can call and scheduled appointment. I advised patient if it gets to be too much before her appointment she could go the emergency room and they could provide her with assistance. Patient verbalizes understand at this time.

## 2015-12-08 ENCOUNTER — Inpatient Hospital Stay (HOSPITAL_COMMUNITY)
Admission: AD | Admit: 2015-12-08 | Discharge: 2015-12-08 | Disposition: A | Discharge status: Home / Self Care | Payer: BLUE CROSS/BLUE SHIELD | Source: Ambulatory Visit | Attending: Family Medicine | Admitting: Family Medicine

## 2015-12-08 ENCOUNTER — Encounter (HOSPITAL_COMMUNITY): Payer: Self-pay | Admitting: *Deleted

## 2015-12-08 ENCOUNTER — Inpatient Hospital Stay (HOSPITAL_COMMUNITY): Payer: BLUE CROSS/BLUE SHIELD

## 2015-12-08 DIAGNOSIS — Z9889 Other specified postprocedural states: Secondary | ICD-10-CM | POA: Diagnosis not present

## 2015-12-08 DIAGNOSIS — R51 Headache: Secondary | ICD-10-CM | POA: Diagnosis not present

## 2015-12-08 DIAGNOSIS — Z90721 Acquired absence of ovaries, unilateral: Secondary | ICD-10-CM | POA: Diagnosis not present

## 2015-12-08 DIAGNOSIS — T8389XA Other specified complication of genitourinary prosthetic devices, implants and grafts, initial encounter: Secondary | ICD-10-CM | POA: Diagnosis not present

## 2015-12-08 DIAGNOSIS — J45909 Unspecified asthma, uncomplicated: Secondary | ICD-10-CM | POA: Diagnosis not present

## 2015-12-08 DIAGNOSIS — R109 Unspecified abdominal pain: Secondary | ICD-10-CM | POA: Diagnosis present

## 2015-12-08 DIAGNOSIS — K219 Gastro-esophageal reflux disease without esophagitis: Secondary | ICD-10-CM | POA: Insufficient documentation

## 2015-12-08 DIAGNOSIS — Z87891 Personal history of nicotine dependence: Secondary | ICD-10-CM | POA: Diagnosis not present

## 2015-12-08 DIAGNOSIS — Z30432 Encounter for removal of intrauterine contraceptive device: Secondary | ICD-10-CM

## 2015-12-08 DIAGNOSIS — X58XXXA Exposure to other specified factors, initial encounter: Secondary | ICD-10-CM | POA: Diagnosis not present

## 2015-12-08 DIAGNOSIS — Z9079 Acquired absence of other genital organ(s): Secondary | ICD-10-CM

## 2015-12-08 DIAGNOSIS — F319 Bipolar disorder, unspecified: Secondary | ICD-10-CM | POA: Diagnosis not present

## 2015-12-08 DIAGNOSIS — R102 Pelvic and perineal pain: Secondary | ICD-10-CM | POA: Diagnosis not present

## 2015-12-08 DIAGNOSIS — G4730 Sleep apnea, unspecified: Secondary | ICD-10-CM | POA: Insufficient documentation

## 2015-12-08 DIAGNOSIS — T8332XA Displacement of intrauterine contraceptive device, initial encounter: Secondary | ICD-10-CM

## 2015-12-08 LAB — CBC
HEMATOCRIT: 41.6 % (ref 36.0–46.0)
Hemoglobin: 14.3 g/dL (ref 12.0–15.0)
MCH: 30.9 pg (ref 26.0–34.0)
MCHC: 34.4 g/dL (ref 30.0–36.0)
MCV: 89.8 fL (ref 78.0–100.0)
Platelets: 297 10*3/uL (ref 150–400)
RBC: 4.63 MIL/uL (ref 3.87–5.11)
RDW: 12.6 % (ref 11.5–15.5)
WBC: 9.9 10*3/uL (ref 4.0–10.5)

## 2015-12-08 LAB — COMPREHENSIVE METABOLIC PANEL
ALT: 15 U/L (ref 14–54)
ANION GAP: 7 (ref 5–15)
AST: 18 U/L (ref 15–41)
Albumin: 4.7 g/dL (ref 3.5–5.0)
Alkaline Phosphatase: 74 U/L (ref 38–126)
BILIRUBIN TOTAL: 0.6 mg/dL (ref 0.3–1.2)
BUN: 15 mg/dL (ref 6–20)
CO2: 26 mmol/L (ref 22–32)
Calcium: 9.5 mg/dL (ref 8.9–10.3)
Chloride: 109 mmol/L (ref 101–111)
Creatinine, Ser: 0.92 mg/dL (ref 0.44–1.00)
GFR calc Af Amer: >60 mL/min (ref >60)
Glucose, Bld: 97 mg/dL (ref 65–99)
POTASSIUM: 4.1 mmol/L (ref 3.5–5.1)
Sodium: 142 mmol/L (ref 135–145)
TOTAL PROTEIN: 7.4 g/dL (ref 6.5–8.1)

## 2015-12-08 LAB — URINALYSIS, ROUTINE W REFLEX MICROSCOPIC
BILIRUBIN URINE: NEGATIVE
Glucose, UA: NEGATIVE mg/dL
KETONES UR: NEGATIVE mg/dL
LEUKOCYTES UA: NEGATIVE
NITRITE: NEGATIVE
Protein, ur: NEGATIVE mg/dL
SPECIFIC GRAVITY, URINE: 1.025 (ref 1.005–1.030)
pH: 5.5 (ref 5.0–8.0)

## 2015-12-08 LAB — WET PREP, GENITAL
CLUE CELLS WET PREP: NONE SEEN
Sperm: NONE SEEN
Trich, Wet Prep: NONE SEEN
YEAST WET PREP: NONE SEEN

## 2015-12-08 LAB — POCT PREGNANCY, URINE: PREG TEST UR: NEGATIVE

## 2015-12-08 LAB — URINE MICROSCOPIC-ADD ON
BACTERIA UA: NONE SEEN
RBC / HPF: NONE SEEN RBC/hpf (ref 0–5)
WBC, UA: NONE SEEN WBC/hpf (ref 0–5)

## 2015-12-08 MED ORDER — KETOROLAC TROMETHAMINE 30 MG/ML IJ SOLN
30.0000 mg | Freq: Once | INTRAMUSCULAR | Status: AC
  Administered 2015-12-08: 30 mg via INTRAMUSCULAR
  Filled 2015-12-08: qty 1

## 2015-12-08 NOTE — MAU Note (Signed)
Pt had her Left fallopian tube removed 2 weeks ago.  Has has some vaginal bleeding since procedure.  Had intercourse last night and now is having increased abd and vaginal pain and increased vag bleeding. ( pt stated she thinks it probably was too soon).

## 2015-12-08 NOTE — MAU Provider Note (Signed)
History     CSN: [id number]  Arrival date and time: 12/08/15 2050   First Provider Initiated Contact with Patient 12/08/15 2119      Chief Complaint  Patient presents with  . Abdominal Pain   Non-pregnant female 2 wks post left salpingectomy for hydrosalpinx c/o lower abdominal and pelvic pain, and VB since IC last night. She describes it as constant and achy with intermittent shooting pain. She took Ibuprofen 800 mg this morning and got some relief with that. She describes the bleeding as red and moderate amt, not as much as menses. She also c/o nausea and malaise since the onset of pain with one episode of vomiting. She denies fever. No urinary sx. No vaginal discharge. No new partner.    Menses: none Bleeding: none Contraception: IUD, Mirena DES exposure: unknown Blood transfusions: none Sexually transmitted diseases: no past history  Past Medical History:  Diagnosis Date  . Asthma    allergy induced  . Attention and concentration deficit   . Bipolar 1 disorder ([place])   . Exposure to STD   . GERD (gastroesophageal reflux disease)   . Headache    Migraines  . 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 Pneumonia   . Sleep apnea    childhood, adenoidectomy  . Vaginal Pap smear, abnormal 2014    Past Surgical History:  Procedure Laterality Date  . LAPAROSCOPIC UNILATERAL SALPINGECTOMY Left 11/21/2015   Procedure: LAPAROSCOPIC UNILATERAL SALPINGECTOMY;  Surgeon: Woodroe Mode, MD;  Location: [facility];  Service: Gynecology;  Laterality: Left;  . LAPAROSCOPY N/A 11/21/2015   Procedure: LAPAROSCOPY DIAGNOSTIC;  Surgeon: Woodroe Mode, MD;  Location: [facility];  Service: Gynecology;  Laterality: N/A;  . NASAL SINUS SURGERY    . TONSILECTOMY, ADENOIDECTOMY, BILATERAL MYRINGOTOMY AND TUBES    . TONSILLECTOMY      Family History  Problem Relation Age of Onset  . Heart disease Father   . Cancer Maternal Grandmother      breast  . Parkinsonism Maternal Grandfather   . Cancer Paternal Aunt     breast     Social History  Substance Use Topics  . Smoking status: Former Smoker    Years: 3.00    Quit date: 10/23/2011  . Smokeless tobacco: Never Used  . Alcohol use 6.6 oz/week    8 Glasses of wine, 3 Standard drinks or equivalent per week     Comment: occ    Allergies:  Allergies  Allergen Reactions  . Ceclor [Cefaclor]     Prescriptions Prior to Admission  Medication Sig Dispense Refill Last Dose  . clonazePAM (KLONOPIN) 0.5 MG tablet Take 0.5 mg by mouth 2 (two) times daily as needed for anxiety.   Past Month at Unknown time  . ibuprofen (ADVIL,MOTRIN) 800 MG tablet Take 1 tablet (800 mg total) by mouth every 8 (eight) hours as needed. (Patient taking differently: Take 800 mg by mouth every 8 (eight) hours as needed for moderate pain. ) 30 tablet 1 12/08/2015 at Unknown time  . oxyCODONE-acetaminophen (PERCOCET/ROXICET) 5-325 MG tablet Take 1-2 tablets by mouth every 6 (six) hours as needed. (Patient taking differently: Take 1-2 tablets by mouth every 6 (six) hours as needed for moderate pain or severe pain. ) 20 tablet 0 Past Month at Unknown time  . ibuprofen (ADVIL,MOTRIN) 600 MG tablet Take 1 tablet (600 mg total) by mouth every 6 (six) hours as needed. (Patient  not taking: Reported on 12/08/2015) 30 tablet 0 Not Taking at Unknown time  . levonorgestrel (MIRENA) 20 MCG/24HR IUD 1 Intra Uterine Device by Intrauterine route once.   Not Taking  . traMADol (ULTRAM) 50 MG tablet Take 1 tablet (50 mg total) by mouth every 6 (six) hours as needed. 1-2 tabs q 6 hrs prn (Patient not taking: Reported on 12/04/2015) 20 tablet 0 Not Taking    Review of Systems  Constitutional: Positive for malaise/fatigue.  Gastrointestinal: Positive for abdominal pain, diarrhea, nausea and vomiting.  Genitourinary: Negative.    Physical Exam   Blood pressure 133/68, pulse 72, temperature 98.4 F (36.9 C), temperature  source Oral, resp. rate 18.  Physical Exam  Constitutional: She is oriented to person, place, and time. She appears well-developed and well-nourished.  HENT:  Head: Normocephalic and atraumatic.  Neck: Normal range of motion. Neck supple.  Cardiovascular: Normal rate and regular rhythm.   Respiratory: Effort normal and breath sounds normal.  GI: Soft. Bowel sounds are normal. She exhibits no distension and no mass. There is tenderness (throughout all quadrants, LLQ>RLQ). There is no rebound and no guarding.  Genitourinary:  Genitourinary Comments: External: no lesions Vagina: rugated, parous, strings present,  Scant brown discharge Uterus: non enlarged, anteverted, mildly tender, mild CMT Adnexae: no masses, mod tenderness left, mild tenderness right   Musculoskeletal: Normal range of motion.  Neurological: She is alert and oriented to person, place, and time.  Skin: Skin is warm and dry.  Incisions to umbilicus, RLQ, and LLQ well approximated and healing. No edema, erythema, or drainage.  Psychiatric: She has a normal mood and affect.   Results for orders placed or performed during the hospital encounter of 12/08/15 (from the past 24 hour(s))  Urinalysis, Routine w reflex microscopic (not at [facility])     Status: Abnormal   Collection Time: 12/08/15  9:00 PM  Result Value Ref Range   Color, Urine YELLOW YELLOW   APPearance CLEAR CLEAR   Specific Gravity, Urine 1.025 1.005 - 1.030   pH 5.5 5.0 - 8.0   Glucose, UA NEGATIVE NEGATIVE mg/dL   Hgb urine dipstick TRACE (A) NEGATIVE   Bilirubin Urine NEGATIVE NEGATIVE   Ketones, ur NEGATIVE NEGATIVE mg/dL   Protein, ur NEGATIVE NEGATIVE mg/dL   Nitrite NEGATIVE NEGATIVE   Leukocytes, UA NEGATIVE NEGATIVE  Urine microscopic-add on     Status: Abnormal   Collection Time: 12/08/15  9:00 PM  Result Value Ref Range   Squamous Epithelial / LPF 0-5 (A) NONE SEEN   WBC, UA NONE SEEN 0 - 5 WBC/hpf   RBC / HPF NONE SEEN 0 - 5 RBC/hpf    Bacteria, UA NONE SEEN NONE SEEN  Pregnancy, urine POC     Status: None   Collection Time: 12/08/15  9:12 PM  Result Value Ref Range   Preg Test, Ur NEGATIVE NEGATIVE  Wet prep, genital     Status: Abnormal   Collection Time: 12/08/15  9:35 PM  Result Value Ref Range   Yeast Wet Prep HPF POC NONE SEEN NONE SEEN   Trich, Wet Prep NONE SEEN NONE SEEN   Clue Cells Wet Prep HPF POC NONE SEEN NONE SEEN   WBC, Wet Prep HPF POC FEW (A) NONE SEEN   Sperm NONE SEEN   CBC     Status: None   Collection Time: 12/08/15  9:52 PM  Result Value Ref Range   WBC 9.9 4.0 - 10.5 K/uL   RBC 4.63 3.87 -  5.11 MIL/uL   Hemoglobin 14.3 12.0 - 15.0 g/dL   HCT 41.6 36.0 - 46.0 %   MCV 89.8 78.0 - 100.0 fL   MCH 30.9 26.0 - 34.0 pg   MCHC 34.4 30.0 - 36.0 g/dL   RDW 12.6 11.5 - 15.5 %   Platelets 297 150 - 400 K/uL  Comprehensive metabolic panel     Status: None   Collection Time: 12/08/15  9:52 PM  Result Value Ref Range   Sodium 142 135 - 145 mmol/L   Potassium 4.1 3.5 - 5.1 mmol/L   Chloride 109 101 - 111 mmol/L   CO2 26 22 - 32 mmol/L   Glucose, Bld 97 65 - 99 mg/dL   BUN 15 6 - 20 mg/dL   Creatinine, Ser 0.92 0.44 - 1.00 mg/dL   Calcium 9.5 8.9 - 10.3 mg/dL   Total Protein 7.4 6.5 - 8.1 g/dL   Albumin 4.7 3.5 - 5.0 g/dL   AST 18 15 - 41 U/L   ALT 15 14 - 54 U/L   Alkaline Phosphatase 74 38 - 126 U/L   Total Bilirubin 0.6 0.3 - 1.2 mg/dL   GFR calc non Af Amer >60 >60 mL/min   GFR calc Af Amer >60 >60 mL/min   Anion gap 7 5 - 15   US Transvaginal Non-ob  Result Date: 12/08/2015 CLINICAL DATA:  Vaginal bleeding since a left salpingectomy 2 weeks ago. Acute pelvic pain. EXAM: ULTRASOUND PELVIS TRANSVAGINAL TECHNIQUE: Transvaginal ultrasound examination of the pelvis was performed including evaluation of the uterus, ovaries, adnexal regions, and pelvic cul-de-sac. COMPARISON:  11/01/2015. FINDINGS: Uterus Measurements: 7.1 x 3.6 x 3.1 cm. No fibroids or other mass visualized. Endometrium  Thickness: 5.0 mm. Intrauterine device in the lower uterus segment and cervical canal. Right ovary Measurements: 2.6 x 2.0 x 2.0 cm. Normal appearance/no adnexal mass. Left ovary Measurements: 2.7 x 2.4 x 2.3 cm. Normal appearance/no adnexal mass. Other findings:  No abnormal free fluid IMPRESSION: 1. Intrauterine device in the endometrial canal in the lower uterine segment and in the cervical canal. 2. Otherwise, normal examination. Electronically Signed   By: Claudie Revering M.D.   On: 12/08/2015 22:38    MAU Course  Procedures Toradol 30 mg IM x1  MDM Labs and [place] ordered and reviewed. No evidence of acute abdomen or pelvic process, pain likely d/t malpositioned IUD. N/V likely a secondary pain response. Pain reported improved after Toradol. Stable for discharge home.   Assessment and Plan   1. Acute pelvic pain, female   2. H/O unilateral salpingectomy   3. Malpositioned intrauterine device, initial encounter [facility])    Discharge home Ibuprofen 800 mg po q8 hrs prn (has Rx) Follow up in [place] in 1-2 weeks for new IUD placement Abstain from IC or consistent use of condoms Return for worsening sx  Julianne Handler, CNM 12/08/2015, 9:35 PM

## 2015-12-08 NOTE — Discharge Instructions (Signed)

## 2015-12-11 LAB — GC/CHLAMYDIA PROBE AMP (~~LOC~~) NOT AT ARMC
CHLAMYDIA, DNA PROBE: NEGATIVE
NEISSERIA GONORRHEA: NEGATIVE

## 2015-12-18 ENCOUNTER — Ambulatory Visit: Payer: BLUE CROSS/BLUE SHIELD | Admitting: Obstetrics & Gynecology

## 2016-02-16 ENCOUNTER — Emergency Department (HOSPITAL_COMMUNITY): Payer: BLUE CROSS/BLUE SHIELD

## 2016-02-16 ENCOUNTER — Emergency Department (HOSPITAL_COMMUNITY)
Admission: EM | Admit: 2016-02-16 | Discharge: 2016-02-17 | Disposition: A | Discharge status: Home / Self Care | Payer: BLUE CROSS/BLUE SHIELD | Attending: Emergency Medicine | Admitting: Emergency Medicine

## 2016-02-16 ENCOUNTER — Encounter (HOSPITAL_COMMUNITY): Payer: Self-pay | Admitting: Emergency Medicine

## 2016-02-16 DIAGNOSIS — S7001XA Contusion of right hip, initial encounter: Secondary | ICD-10-CM | POA: Diagnosis not present

## 2016-02-16 DIAGNOSIS — W19XXXA Unspecified fall, initial encounter: Secondary | ICD-10-CM

## 2016-02-16 DIAGNOSIS — Y939 Activity, unspecified: Secondary | ICD-10-CM | POA: Diagnosis not present

## 2016-02-16 DIAGNOSIS — M25521 Pain in right elbow: Secondary | ICD-10-CM | POA: Insufficient documentation

## 2016-02-16 DIAGNOSIS — Y929 Unspecified place or not applicable: Secondary | ICD-10-CM | POA: Insufficient documentation

## 2016-02-16 DIAGNOSIS — Y990 Civilian activity done for income or pay: Secondary | ICD-10-CM | POA: Insufficient documentation

## 2016-02-16 DIAGNOSIS — Z87891 Personal history of nicotine dependence: Secondary | ICD-10-CM | POA: Insufficient documentation

## 2016-02-16 DIAGNOSIS — W010XXA Fall on same level from slipping, tripping and stumbling without subsequent striking against object, initial encounter: Secondary | ICD-10-CM | POA: Diagnosis not present

## 2016-02-16 DIAGNOSIS — J45909 Unspecified asthma, uncomplicated: Secondary | ICD-10-CM | POA: Diagnosis not present

## 2016-02-16 DIAGNOSIS — S79911A Unspecified injury of right hip, initial encounter: Secondary | ICD-10-CM | POA: Diagnosis present

## 2016-02-16 DIAGNOSIS — M25551 Pain in right hip: Secondary | ICD-10-CM

## 2016-02-16 HISTORY — DX: Unspecified ovarian cyst, unspecified side: N83.209

## 2016-02-16 HISTORY — DX: Unspecified ectopic pregnancy without intrauterine pregnancy: O00.90

## 2016-02-16 MED ORDER — METHOCARBAMOL 500 MG PO TABS
1000.0000 mg | ORAL_TABLET | Freq: Once | ORAL | Status: AC
  Administered 2016-02-17: 1000 mg via ORAL
  Filled 2016-02-16: qty 2

## 2016-02-16 MED ORDER — KETOROLAC TROMETHAMINE 60 MG/2ML IM SOLN
60.0000 mg | Freq: Once | INTRAMUSCULAR | Status: AC
  Administered 2016-02-17: 60 mg via INTRAMUSCULAR
  Filled 2016-02-16: qty 2

## 2016-02-16 NOTE — ED Notes (Signed)
MD at bedside. 

## 2016-02-16 NOTE — ED Provider Notes (Signed)
[facility] DEPT Provider Note   CSN: [id number] Arrival date & time: 02/16/16  2301 By signing my name below, I, Georgette Shell, attest that this documentation has been prepared under the direction and in the presence of Duffy Bruce, MD. Electronically Signed: Georgette Shell, ED Scribe. 02/16/16. 11:40 PM.  History   Chief Complaint Chief Complaint  Patient presents with  . Fall   HPI Comments: Danielle Evans is a 25 y.o. female who is right handed presents to the Emergency Department complaining of gradually worsening, sharp, 7/10 back pain and right-sided pain s/p mechanical fall two weeks ago. Pt describes her pain as sharp and shoots to her right foot, causing numbness intermittently. Pt reports she slipped at work and fell and landed on her right side. No LOC. Pt is ambulatory. She has taken Ibuprofen and Tylenol with no relief. Pt is not on blood thinners. Pt denies any additional injuries. She further denies fever or any other associated symptoms.   The history is provided by the patient. No language interpreter was used.    Past Medical History:  Diagnosis Date  . Asthma    allergy induced  . Attention and concentration deficit   . Bipolar 1 disorder ([place])   . Ectopic pregnancy   . Exposure to STD   . GERD (gastroesophageal reflux disease)   . Headache    Migraines  . 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 Ovarian cyst   . Pneumonia   . Sleep apnea    childhood, adenoidectomy  . Vaginal Pap smear, abnormal 2014    Patient Active Problem List   Diagnosis Date Noted  . Pelvic pain in female 09/12/2011  . Ovarian cyst 09/12/2011  . Attention and concentration deficit   . Bipolar 1 disorder [facility])     Past Surgical History:  Procedure Laterality Date  . DILATION AND CURETTAGE OF UTERUS    . LAPAROSCOPIC UNILATERAL SALPINGECTOMY Left 11/21/2015   Procedure: LAPAROSCOPIC UNILATERAL SALPINGECTOMY;   Surgeon: Woodroe Mode, MD;  Location: [facility];  Service: Gynecology;  Laterality: Left;  . LAPAROSCOPY N/A 11/21/2015   Procedure: LAPAROSCOPY DIAGNOSTIC;  Surgeon: Woodroe Mode, MD;  Location: [facility];  Service: Gynecology;  Laterality: N/A;  . NASAL SINUS SURGERY    . TONSILECTOMY, ADENOIDECTOMY, BILATERAL MYRINGOTOMY AND TUBES    . TONSILLECTOMY    . TUBAL LIGATION      OB History    Gravida Para Term Preterm AB Living   4       4     SAB TAB Ectopic Multiple Live Births   3   1           Home Medications    Prior to Admission medications   Medication Sig Start Date End Date Taking? Authorizing Provider  cyclobenzaprine (FLEXERIL) 10 MG tablet Take 1 tablet (10 mg total) by mouth 3 (three) times daily as needed for muscle spasms. 02/17/16   Duffy Bruce, MD  ibuprofen (ADVIL,MOTRIN) 800 MG tablet Take 1 tablet (800 mg total) by mouth every 8 (eight) hours as needed. Patient not taking: Reported on 02/17/2016 11/07/15   Osborne Oman, MD  naproxen (NAPROSYN) 375 MG tablet Take 1 tablet (375 mg total) by mouth 2 (two) times daily as needed for moderate pain. 02/17/16 02/24/16  Duffy Bruce, MD  oxyCODONE-acetaminophen (PERCOCET/ROXICET) 5-325 MG tablet Take 1-2 tablets by mouth every 6 (six) hours as needed.  Patient not taking: Reported on 02/17/2016 11/21/15   Woodroe Mode, MD    Family History Family History  Problem Relation Age of Onset  . Heart disease Father   . Cancer Maternal Grandmother     breast  . Parkinsonism Maternal Grandfather   . Cancer Paternal Aunt     breast     Social History Social History  Substance Use Topics  . Smoking status: Former Smoker    Years: 3.00    Quit date: 10/23/2011  . Smokeless tobacco: Never Used  . Alcohol use 6.6 oz/week    8 Glasses of wine, 3 Standard drinks or equivalent per week     Comment: occ     Allergies   Ceclor [cefaclor]   Review of Systems Review of Systems  Constitutional: Positive for  fatigue. Negative for chills and fever.  HENT: Negative for congestion, rhinorrhea and sore throat.   Eyes: Negative for visual disturbance.  Respiratory: Negative for cough, shortness of breath and wheezing.   Cardiovascular: Negative for chest pain and leg swelling.  Gastrointestinal: Negative for abdominal pain, diarrhea, nausea and vomiting.  Genitourinary: Negative for dysuria, flank pain, vaginal bleeding and vaginal discharge.  Musculoskeletal: Positive for arthralgias, back pain and myalgias. Negative for neck pain.  Skin: Negative for rash.  Allergic/Immunologic: Negative for immunocompromised state.  Neurological: Positive for numbness. Negative for syncope and headaches.  Hematological: Does not bruise/bleed easily.  All other systems reviewed and are negative.    Physical Exam Updated Vital Signs BP 130/85 (BP Location: Left Arm)   Pulse 70   Temp 98.1 F (36.7 C) (Oral)   Resp 16   Ht 5\' 7"  (1.702 m)   Wt 226 lb 3.2 oz (102.6 kg)   LMP 01/14/2016 (Exact Date)   SpO2 99%   BMI 35.43 kg/m   Physical Exam  Constitutional: She is oriented to person, place, and time. She appears well-developed and well-nourished. No distress.  HENT:  Head: Normocephalic and atraumatic.  Eyes: Conjunctivae are normal.  Neck: Neck supple.  Cardiovascular: Normal rate, regular rhythm and normal heart sounds.  Exam reveals no friction rub.   No murmur heard. Pulmonary/Chest: Effort normal and breath sounds normal. No respiratory distress. She has no wheezes. She has no rales.  Abdominal: She exhibits no distension.  Musculoskeletal: She exhibits no edema.  Neurological: She is alert and oriented to person, place, and time. She exhibits normal muscle tone.  Skin: Skin is warm. Capillary refill takes less than 2 seconds. No rash noted.  Psychiatric: She has a normal mood and affect.  Nursing note and vitals reviewed.   UPPER EXTREMITY EXAM: Right  INSPECTION & PALPATION: Moderate  tenderness over radial aspect of elbow and proximal forearm. No bruising or deformity. No open wounds.  SENSORY: Sensation is intact to light touch in:  Superficial radial nerve distribution (dorsal first web space) Median nerve distribution (tip of index finger)   Ulnar nerve distribution (tip of small finger)     MOTOR:  + Motor posterior interosseous nerve (thumb IP extension) + Anterior interosseous nerve (thumb IP flexion, index finger DIP flexion) + Radial nerve (wrist extension) + Median nerve (palpable firing thenar mass) + Ulnar nerve (palpable firing of first dorsal interosseous muscle)  VASCULAR: 2+ radial pulse Brisk capillary refill < 2 sec, fingers warm and well-perfused   LOWER EXTREMITY EXAM: Right  INSPECTION & PALPATION: Moderate tenderness over right greater trochanter. No bony deformity. Mild tenderness over right spinal area of lower  lumbar and sacral spine as well. No step-offs.  SENSORY: sensation is intact to light touch in:  Superficial peroneal nerve distribution (over dorsum of foot) Deep peroneal nerve distribution (over first dorsal web space) Sural nerve distribution (over lateral aspect 5th metatarsal) Saphenous nerve distribution (over medial instep)  MOTOR:  + Motor EHL (great toe dorsiflexion) + FHL (great toe plantar flexion)  + TA (ankle dorsiflexion)  + GSC (ankle plantar flexion)  VASCULAR: 2+ dorsalis pedis and posterior tibialis pulses Capillary refill < 2 sec, toes warm and well-perfused  COMPARTMENTS: Soft, warm, well-perfused No pain with passive extension No parethesias   ED Treatments / Results  DIAGNOSTIC STUDIES: Oxygen Saturation is 99% on RA, normal by my interpretation.    COORDINATION OF CARE: 11:38 PM Discussed treatment plan with pt at bedside which includes x-rays and pt agreed to plan.  Labs (all labs ordered are listed, but only abnormal results are displayed) Labs Reviewed - No data to display  EKG  EKG  Interpretation None       Radiology Dg Lumbar Spine Complete  Result Date: 02/17/2016 CLINICAL DATA:  Status post fall 2 weeks ago L4, with lower back pain Initial encounter. EXAM: LUMBAR SPINE - COMPLETE 4+ VIEW COMPARISON:  CT of the abdomen and pelvis performed 09/12/2011 FINDINGS: There is no evidence of fracture or subluxation. Vertebral bodies demonstrate normal height and alignment. Intervertebral disc spaces are preserved. The visualized neural foramina are grossly unremarkable in appearance. The visualized bowel gas pattern is unremarkable in appearance; air and stool are noted within the colon. Sclerotic change is noted at the sacroiliac joints bilaterally. IMPRESSION: No evidence of fracture or subluxation along the lumbar spine. Electronically Signed   By: Garald Balding M.D.   On: 02/17/2016 00:39   Dg Elbow Complete Right  Result Date: 02/17/2016 CLINICAL DATA:  Status post fall 2 weeks ago, with right elbow pain. Initial encounter. EXAM: RIGHT ELBOW - COMPLETE 3+ VIEW COMPARISON:  None. FINDINGS: There is no evidence of fracture or dislocation. The visualized joint spaces are preserved. No significant joint effusion is identified. The soft tissues are unremarkable in appearance. IMPRESSION: No evidence of fracture or dislocation. Electronically Signed   By: Garald Balding M.D.   On: 02/17/2016 00:37   Dg Hip Unilat With Pelvis 2-3 Views Right  Result Date: 02/17/2016 CLINICAL DATA:  Status post fall 2 weeks ago, with right hip pain. Initial encounter. EXAM: DG HIP (WITH OR WITHOUT PELVIS) 2-3V RIGHT COMPARISON:  None. FINDINGS: There is no evidence of fracture or dislocation. Both femoral heads are seated normally within their respective acetabula. The proximal right femur appears intact. No significant degenerative change is appreciated. The sacroiliac joints are unremarkable in appearance. The visualized bowel gas pattern is grossly unremarkable in appearance. IMPRESSION: No  evidence of fracture or dislocation. Electronically Signed   By: Garald Balding M.D.   On: 02/17/2016 00:38    Procedures Procedures (including critical care time)  Medications Ordered in ED Medications  ketorolac (TORADOL) injection 60 mg (60 mg Intramuscular Given 02/17/16 0023)  methocarbamol (ROBAXIN) tablet 1,000 mg (1,000 mg Oral Given 02/17/16 0021)  dexamethasone (DECADRON) tablet 10 mg (10 mg Oral Given 02/17/16 0033)     Initial Impression / Assessment and Plan / ED Course  I have reviewed the triage vital signs and the nursing notes.  Pertinent labs & imaging results that were available during my care of the patient were reviewed by me and considered in my medical decision making (  see chart for details).  Clinical Course  25 year old female who presents with right-sided musculoskeletal pain after fall 2 weeks ago. She is neurovascularly intact. No open wounds. Plain films of areas of tenderness showed no fractures or malalignment. I suspect she has ongoing musculoskeletal pain, with persistent pain secondary to continued use at work. Will place on scheduled NSAIDs, muscle relaxants, and advise physical therapy at home. Otherwise, she does have intermittent, radiating pain down her right leg concerning for possible sciatica. Plain films of the lumbar spine show no abnormality. No loss of bowel or bladder function or red flag for cauda equina. Will give her a one-time dose of Decadron for possible symptomatic improvement and advise continued outpatient management.   Final Clinical Impressions(s) / ED Diagnoses   Final diagnoses:  Right hip pain  Fall, initial encounter  Contusion of right hip, initial encounter  Right elbow pain    New Prescriptions Discharge Medication List as of 02/17/2016 12:42 AM    START taking these medications   Details  cyclobenzaprine (FLEXERIL) 10 MG tablet Take 1 tablet (10 mg total) by mouth 3 (three) times daily as needed for muscle spasms.,  Starting Sat 02/17/2016, Print    naproxen (NAPROSYN) 375 MG tablet Take 1 tablet (375 mg total) by mouth 2 (two) times daily as needed for moderate pain., Starting Sat 02/17/2016, Until Sat 02/24/2016, Print       I personally performed the services described in this documentation, which was scribed in my presence. The recorded information has been reviewed and is accurate.     Duffy Bruce, MD 02/17/16 [id number]

## 2016-02-16 NOTE — ED Notes (Signed)
Patient transported to X-ray 

## 2016-02-16 NOTE — ED Triage Notes (Signed)
Pt reports fall two weeks ago, states she fell hard on R side of body and has had worsening pain since. /o back pain, R hip pain, R elbow and R hand pain. States pain is sharp and shoots to R foot, causing intermittent numbness. States ibuprofen and tylenol not effective.

## 2016-02-17 MED ORDER — CYCLOBENZAPRINE HCL 10 MG PO TABS: 10.0000 mg | ORAL_TABLET | Freq: Three times a day (TID) | ORAL | 0 refills | Status: DC | PRN

## 2016-02-17 MED ORDER — DEXAMETHASONE 4 MG PO TABS
10.0000 mg | ORAL_TABLET | Freq: Once | ORAL | Status: AC
  Administered 2016-02-17: 10 mg via ORAL
  Filled 2016-02-17: qty 3

## 2016-02-17 MED ORDER — NAPROXEN 375 MG PO TABS: 375.0000 mg | ORAL_TABLET | Freq: Two times a day (BID) | ORAL | 0 refills | Status: AC | PRN

## 2016-02-19 ENCOUNTER — Encounter (HOSPITAL_COMMUNITY): Payer: Self-pay | Admitting: *Deleted

## 2016-02-19 ENCOUNTER — Inpatient Hospital Stay (HOSPITAL_COMMUNITY): Payer: BLUE CROSS/BLUE SHIELD

## 2016-02-19 ENCOUNTER — Inpatient Hospital Stay (HOSPITAL_COMMUNITY)
Admission: AD | Admit: 2016-02-19 | Discharge: 2016-02-20 | Disposition: A | Discharge status: Home / Self Care | Payer: BLUE CROSS/BLUE SHIELD | Source: Ambulatory Visit | Attending: Family Medicine | Admitting: Family Medicine

## 2016-02-19 DIAGNOSIS — R102 Pelvic and perineal pain: Secondary | ICD-10-CM | POA: Diagnosis present

## 2016-02-19 DIAGNOSIS — G8929 Other chronic pain: Secondary | ICD-10-CM | POA: Diagnosis not present

## 2016-02-19 DIAGNOSIS — N946 Dysmenorrhea, unspecified: Secondary | ICD-10-CM | POA: Diagnosis not present

## 2016-02-19 DIAGNOSIS — Z3202 Encounter for pregnancy test, result negative: Secondary | ICD-10-CM | POA: Diagnosis not present

## 2016-02-19 DIAGNOSIS — R1032 Left lower quadrant pain: Secondary | ICD-10-CM

## 2016-02-19 DIAGNOSIS — Z87891 Personal history of nicotine dependence: Secondary | ICD-10-CM | POA: Diagnosis not present

## 2016-02-19 LAB — POCT PREGNANCY, URINE: PREG TEST UR: NEGATIVE

## 2016-02-19 LAB — URINE MICROSCOPIC-ADD ON

## 2016-02-19 LAB — URINALYSIS, ROUTINE W REFLEX MICROSCOPIC
BILIRUBIN URINE: NEGATIVE
Glucose, UA: NEGATIVE mg/dL
Ketones, ur: NEGATIVE mg/dL
LEUKOCYTES UA: NEGATIVE
NITRITE: NEGATIVE
Protein, ur: NEGATIVE mg/dL
SPECIFIC GRAVITY, URINE: 1.01 (ref 1.005–1.030)
pH: 5.5 (ref 5.0–8.0)

## 2016-02-19 LAB — CBC
HEMATOCRIT: 41.2 % (ref 36.0–46.0)
HEMOGLOBIN: 13.9 g/dL (ref 12.0–15.0)
MCH: 30.5 pg (ref 26.0–34.0)
MCHC: 33.7 g/dL (ref 30.0–36.0)
MCV: 90.5 fL (ref 78.0–100.0)
Platelets: 306 10*3/uL (ref 150–400)
RBC: 4.55 MIL/uL (ref 3.87–5.11)
RDW: 12.9 % (ref 11.5–15.5)
WBC: 10.2 10*3/uL (ref 4.0–10.5)

## 2016-02-19 LAB — WET PREP, GENITAL
SPERM: NONE SEEN
Trich, Wet Prep: NONE SEEN
Yeast Wet Prep HPF POC: NONE SEEN

## 2016-02-19 MED ORDER — KETOROLAC TROMETHAMINE 60 MG/2ML IM SOLN: 60.0000 mg | INTRAMUSCULAR | Status: DC

## 2016-02-19 MED ORDER — KETOROLAC TROMETHAMINE 60 MG/2ML IM SOLN
60.0000 mg | Freq: Once | INTRAMUSCULAR | Status: DC
  Filled 2016-02-19: qty 2

## 2016-02-19 NOTE — MAU Provider Note (Signed)
Chief Complaint: Vaginal Bleeding; Vaginal Pain; Abdominal Pain; and Back Pain   None     SUBJECTIVE HPI: Danielle Evans is a 25 y.o. [id number] who presents to maternity admissions reporting onset of vaginal spotting and left lower quadrant abdominal pain x 2 days ago, worsening today. She also reports burning pain to the outside of her vagina/vulva area that is worse when wiping.  She reports the symptoms are new onset but she did have MAU visit for LLQ pain with menses on 12/08/15, less than 1 month following her left salpingectomy for ovarian cyst/infection. Pt reports they found an ectopic pregnancy during her surgery as well.  Pregnancy test prior to surgery was negative.  She has tried ibuprofen and Tylenol for this constant LLQ abdominal pain but neither is helping.  She reports the pain is associated with the onset of spotting. Her last menses was over 1 month ago but she usually has irregular periods even prior to having her IUD. She had a Mirena IUD x 1 year but it was removed in August because it was found in the lower uterine segment/cervix.   She denies urinary symptoms, h/a, dizziness, n/v, or fever/chills.     HPI  Past Medical History:  Diagnosis Date  . Asthma    allergy induced  . Attention and concentration deficit   . Bipolar 1 disorder ([place])   . Ectopic pregnancy   . Exposure to STD   . GERD (gastroesophageal reflux disease)   . Headache    Migraines  . 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 Ovarian cyst   . Pneumonia   . Sleep apnea    childhood, adenoidectomy  . Vaginal Pap smear, abnormal 2014   Past Surgical History:  Procedure Laterality Date  . DILATION AND CURETTAGE OF UTERUS    . LAPAROSCOPIC UNILATERAL SALPINGECTOMY Left 11/21/2015   Procedure: LAPAROSCOPIC UNILATERAL SALPINGECTOMY;  Surgeon: Woodroe Mode, MD;  Location: [facility];  Service: Gynecology;  Laterality: Left;  . LAPAROSCOPY N/A  11/21/2015   Procedure: LAPAROSCOPY DIAGNOSTIC;  Surgeon: Woodroe Mode, MD;  Location: [facility];  Service: Gynecology;  Laterality: N/A;  . NASAL SINUS SURGERY    . TONSILECTOMY, ADENOIDECTOMY, BILATERAL MYRINGOTOMY AND TUBES    . TONSILLECTOMY    . TUBAL LIGATION     Social History   Social History  . Marital status: Single    Spouse name: N/A  . Number of children: N/A  . Years of education: N/A   Occupational History  . Not on file.   Social History Main Topics  . Smoking status: Former Smoker    Years: 3.00    Quit date: 10/23/2011  . Smokeless tobacco: Never Used  . Alcohol use 6.6 oz/week    8 Glasses of wine, 3 Standard drinks or equivalent per week     Comment: occ  . Drug use: No     Comment: marijuana occasionally   . Sexual activity: Yes    Partners: Male    Birth control/ protection: IUD, Condom     Comment: Nuvaring   Other Topics Concern  . Not on file   Social History Narrative  . No narrative on file   No current facility-administered medications on file prior to encounter.    Current Outpatient Prescriptions on File Prior to Encounter  Medication Sig Dispense Refill  . cyclobenzaprine (FLEXERIL) 10 MG tablet Take 1 tablet (10 mg  total) by mouth 3 (three) times daily as needed for muscle spasms. 30 tablet 0  . naproxen (NAPROSYN) 375 MG tablet Take 1 tablet (375 mg total) by mouth 2 (two) times daily as needed for moderate pain. 20 tablet 0  . [DISCONTINUED] etonogestrel-ethinyl estradiol (NUVARING) 0.12-0.015 MG/24HR vaginal ring Use 1 vaginal ring every 3 weeks without week-free 1 each 11  . [DISCONTINUED] norethindrone-ethinyl estradiol 1/35 ([place]offman 1/35, 28,) tablet 1 tablet by mouth 3 times daily for 7 days, then begin one table daily thereafter 2 Package 11   Allergies  Allergen Reactions  . Ceclor [Cefaclor] Hives    ROS:  Review of Systems  Constitutional: Negative for chills, fatigue and fever.  Respiratory: Negative for shortness of  breath.   Cardiovascular: Negative for chest pain.  Gastrointestinal: Positive for abdominal pain.  Genitourinary: Positive for pelvic pain and vaginal bleeding. Negative for difficulty urinating, dysuria, flank pain, vaginal discharge and vaginal pain.  Neurological: Negative for dizziness and headaches.  Psychiatric/Behavioral: Negative.      I have reviewed patient's Past Medical Hx, Surgical Hx, Family Hx, Social Hx, medications and allergies.   Physical Exam   Patient Vitals for the past 24 hrs:  BP Temp Temp src Pulse Resp Height Weight  02/20/16 0020 114/66 - - 88 - - -  02/19/16 1853 125/67 98.4 F (36.9 C) Oral 79 18 5\' 7"  (1.702 m) 222 lb 9.6 oz (101 kg)   Constitutional: Well-developed, well-nourished female in no acute distress.  Cardiovascular: normal rate Respiratory: normal effort GI: Abd soft, non-tender. Pos BS x 4 MS: Extremities nontender, no edema, normal ROM Neurologic: Alert and oriented x 4.  GU: Neg CVAT.  PELVIC EXAM: Cervix pink, visually closed, without lesion, moderate amount dark red bleeding, vaginal walls and external genitalia normal Bimanual exam: Cervix 0/long/high, firm, anterior, positive CMT, uterus nontender, nonenlarged, tenderness in left adnexal region but no masses palpable, right adnexa wnl     LAB RESULTS Results for orders placed or performed during the hospital encounter of 02/19/16 (from the past 24 hour(s))  Urinalysis, Routine w reflex microscopic (not at [facility])     Status: Abnormal   Collection Time: 02/19/16  6:55 PM  Result Value Ref Range   Color, Urine YELLOW YELLOW   APPearance CLEAR CLEAR   Specific Gravity, Urine 1.010 1.005 - 1.030   pH 5.5 5.0 - 8.0   Glucose, UA NEGATIVE NEGATIVE mg/dL   Hgb urine dipstick TRACE (A) NEGATIVE   Bilirubin Urine NEGATIVE NEGATIVE   Ketones, ur NEGATIVE NEGATIVE mg/dL   Protein, ur NEGATIVE NEGATIVE mg/dL   Nitrite NEGATIVE NEGATIVE   Leukocytes, UA NEGATIVE NEGATIVE  Urine  microscopic-add on     Status: Abnormal   Collection Time: 02/19/16  6:55 PM  Result Value Ref Range   Squamous Epithelial / LPF 0-5 (A) NONE SEEN   WBC, UA 0-5 0 - 5 WBC/hpf   RBC / HPF 0-5 0 - 5 RBC/hpf   Bacteria, UA RARE (A) NONE SEEN  Pregnancy, urine POC     Status: None   Collection Time: 02/19/16  7:01 PM  Result Value Ref Range   Preg Test, Ur NEGATIVE NEGATIVE  Wet prep, genital     Status: Abnormal   Collection Time: 02/19/16  8:47 PM  Result Value Ref Range   Yeast Wet Prep HPF POC NONE SEEN NONE SEEN   Trich, Wet Prep NONE SEEN NONE SEEN   Clue Cells Wet Prep HPF POC PRESENT (A) NONE  SEEN   WBC, Wet Prep HPF POC MANY (A) NONE SEEN   Sperm NONE SEEN   CBC     Status: None   Collection Time: 02/19/16  9:06 PM  Result Value Ref Range   WBC 10.2 4.0 - 10.5 K/uL   RBC 4.55 3.87 - 5.11 MIL/uL   Hemoglobin 13.9 12.0 - 15.0 g/dL   HCT 41.2 36.0 - 46.0 %   MCV 90.5 78.0 - 100.0 fL   MCH 30.5 26.0 - 34.0 pg   MCHC 33.7 30.0 - 36.0 g/dL   RDW 12.9 11.5 - 15.5 %   Platelets 306 150 - 400 K/uL    --/--/A POS, A POS (07/19 1350)  IMAGING Preliminary report with normal uterus, ovaries, no abnormalities noted, no free fluid  MAU Management/MDM: Ordered labs and reviewed results.  Consult Dr Nehemiah Settle, reviewed pt history, exam, labs, and narcotic use documented in recent past.  Pelvic US ordered.  No evidence of acute abdomen or infection.  [place] without abnormal findings.  Pain is likely dysmenorrhea.  Pt has Flexeril Rx she has not filled. She reports Naproxen is not helping.  Discussed options to treat dysmenorrhea with pt at length.  Recommend pt take Naproxen twice per day every day when having her period and take Flexeril PRN, especially at night to help her sleep.  Pt decided to use oral contraceptives for now and may have another Mirena placed in the office later.  Rx for Sprintec 28, with 11 refills sent to pt pharmacy.  Pt to use OTC Monistat 3 to treat presumed yeast causing  vaginal irritation.  Also, reviewed pt recent surgery documentation and let pt know that no ectopic pregnancy was found on her salpingectomy, that pathology report did not indicate pregnancy.  Pt reported relief and states understanding.  Pt stable at time of discharge.  ASSESSMENT 1. Dysmenorrhea   2. Chronic LLQ pain   3. Chronic pelvic pain in female     PLAN Discharge home    Medication List    STOP taking these medications   ibuprofen 800 MG tablet Commonly known as:  ADVIL,MOTRIN   oxyCODONE-acetaminophen 5-325 MG tablet Commonly known as:  PERCOCET/ROXICET     TAKE these medications   cyclobenzaprine 10 MG tablet Commonly known as:  FLEXERIL Take 1 tablet (10 mg total) by mouth 3 (three) times daily as needed for muscle spasms.   naproxen 375 MG tablet Commonly known as:  NAPROSYN Take 1 tablet (375 mg total) by mouth 2 (two) times daily as needed for moderate pain.   norgestimate-ethinyl estradiol 0.25-35 MG-MCG tablet Commonly known as:  ORTHO-CYCLEN,SPRINTEC,PREVIFEM Take 1 tablet by mouth daily.      Follow-up [place] for [facility]. Schedule an appointment as soon as possible for a visit today.   Specialty:  Obstetrics and Gynecology Why:  Or with any [facility] office of your choice Contact information: [place] Certified Nurse-Midwife 02/20/2016  1:19 AM

## 2016-02-19 NOTE — MAU Note (Signed)
Missed post op appt.  Had left fallopian tube and ovarian cyst removed in June.  Mirena was knocked out of place and had to be removed about a month later. Started bleeding early this morning.  Spotting.  Having a lot of pain on external vag area.  Hurts to go to the bathroom, wipe or anything.Marland Kitchen  abd and low back pian started today when started bleeding

## 2016-02-20 DIAGNOSIS — N946 Dysmenorrhea, unspecified: Secondary | ICD-10-CM | POA: Diagnosis not present

## 2016-02-20 LAB — GC/CHLAMYDIA PROBE AMP (~~LOC~~) NOT AT ARMC
CHLAMYDIA, DNA PROBE: NEGATIVE
NEISSERIA GONORRHEA: NEGATIVE

## 2016-02-20 MED ORDER — NORGESTIMATE-ETH ESTRADIOL 0.25-35 MG-MCG PO TABS: 1.0000 | ORAL_TABLET | Freq: Every day | ORAL | 11 refills | Status: DC

## 2016-02-20 NOTE — Discharge Instructions (Signed)
Dysmenorrhea °Menstrual cramps (dysmenorrhea) are caused by the muscles of the uterus tightening (contracting) during a menstrual period. For some women, this discomfort is merely bothersome. For others, dysmenorrhea can be severe enough to interfere with everyday activities for a few days each month. °Primary dysmenorrhea is menstrual cramps that last a couple of days when you start having menstrual periods or soon after. This often begins after a teenager starts having her period. As a woman gets older or has a baby, the cramps will usually lessen or disappear. Secondary dysmenorrhea begins later in life, lasts longer, and the pain may be stronger than primary dysmenorrhea. The pain may start before the period and last a few days after the period.  °CAUSES  °Dysmenorrhea is usually caused by an underlying problem, such as: °· The tissue lining the uterus grows outside of the uterus in other areas of the body (endometriosis). °· The endometrial tissue, which normally lines the uterus, is found in or grows into the muscular walls of the uterus (adenomyosis). °· The pelvic blood vessels are engorged with blood just before the menstrual period (pelvic congestive syndrome). °· Overgrowth of cells (polyps) in the lining of the uterus or cervix. °· Falling down of the uterus (prolapse) because of loose or stretched ligaments. °· Depression. °· Bladder problems, infection, or inflammation. °· Problems with the intestine, a tumor, or irritable bowel syndrome. °· Cancer of the female organs or bladder. °· A severely tipped uterus. °· A very tight opening or closed cervix. °· Noncancerous tumors of the uterus (fibroids). °· Pelvic inflammatory disease (PID). °· Pelvic scarring (adhesions) from a previous surgery. °· Ovarian cyst. °· An intrauterine device (IUD) used for birth control. °RISK FACTORS °You may be at greater risk of dysmenorrhea if: °· You are younger than age 30. °· You started puberty early. °· You have  irregular or heavy bleeding. °· You have never given birth. °· You have a family history of this problem. °· You are a smoker. °SIGNS AND SYMPTOMS  °· Cramping or throbbing pain in your lower abdomen. °· Headaches. °· Lower back pain. °· Nausea or vomiting. °· Diarrhea. °· Sweating or dizziness. °· Loose stools. °DIAGNOSIS  °A diagnosis is based on your history, symptoms, physical exam, diagnostic tests, or procedures. Diagnostic tests or procedures may include: °· Blood tests. °· Ultrasonography. °· An examination of the lining of the uterus (dilation and curettage, D&C). °· An examination inside your abdomen or pelvis with a scope (laparoscopy). °· X-rays. °· CT scan. °· MRI. °· An examination inside the bladder with a scope (cystoscopy). °· An examination inside the intestine or stomach with a scope (colonoscopy, gastroscopy). °TREATMENT  °Treatment depends on the cause of the dysmenorrhea. Treatment may include: °· Pain medicine prescribed by your health care provider. °· Birth control pills or an IUD with progesterone hormone in it. °· Hormone replacement therapy. °· Nonsteroidal anti-inflammatory drugs (NSAIDs). These may help stop the production of prostaglandins. °· Surgery to remove adhesions, endometriosis, ovarian cyst, or fibroids. °· Removal of the uterus (hysterectomy). °· Progesterone shots to stop the menstrual period. °· Cutting the nerves on the sacrum that go to the female organs (presacral neurectomy). °· Electric current to the sacral nerves (sacral nerve stimulation). °· Antidepressant medicine. °· Psychiatric therapy, counseling, or group therapy. °· Exercise and physical therapy. °· Meditation and yoga therapy. °· Acupuncture. °HOME CARE INSTRUCTIONS  °· Only take over-the-counter or prescription medicines as directed by your health care provider. °· Place a heating pad   or hot water bottle on your lower back or abdomen. Do not sleep with the heating pad.  Use aerobic exercises, walking,  swimming, biking, and other exercises to help lessen the cramping.  Massage to the lower back or abdomen may help.  Stop smoking.  Avoid alcohol and caffeine. SEEK MEDICAL CARE IF:   Your pain does not get better with medicine.  You have pain with sexual intercourse.  Your pain increases and is not controlled with medicines.  You have abnormal vaginal bleeding with your period.  You develop nausea or vomiting with your period that is not controlled with medicine. SEEK IMMEDIATE MEDICAL CARE IF:  You pass out.    This information is not intended to replace advice given to you by your health care provider. Make sure you discuss any questions you have with your health care provider.   Document Released: 04/15/2005 Document Revised: 12/16/2012 Document Reviewed: 10/01/2012 Elsevier Interactive Patient Education 2016 [place] for [employer] at [facility]       Phone: [phone]  [facility] [employer] [facility] [place] Phone: [place] for [employer] at [place]  Phone: [place] for [place] at [facility]  Phone: [place] [facility] [place] at [place]  Phone: [facility]       Phone: [phone]  [facility]    Phone: [phone]   [facility])    Phone: [facility]    Phone: [phone]  [facility]    Phone: [place]    Phone: [phone]  [place] [facility]       Phone: [phone]   [place] [facility]  Phone: [place]    Phone: [phone]  [facility]   Phone: [phone]  [facility]      Phone: [phone]  [facility]    Phone: [phone]

## 2016-03-07 ENCOUNTER — Encounter (HOSPITAL_COMMUNITY): Payer: Self-pay | Admitting: Emergency Medicine

## 2016-03-07 ENCOUNTER — Ambulatory Visit (HOSPITAL_COMMUNITY)
Admission: EM | Admit: 2016-03-07 | Discharge: 2016-03-07 | Disposition: A | Discharge status: Home / Self Care | Payer: BLUE CROSS/BLUE SHIELD | Attending: Family Medicine | Admitting: Family Medicine

## 2016-03-07 DIAGNOSIS — J069 Acute upper respiratory infection, unspecified: Secondary | ICD-10-CM | POA: Diagnosis not present

## 2016-03-07 DIAGNOSIS — J329 Chronic sinusitis, unspecified: Secondary | ICD-10-CM | POA: Diagnosis not present

## 2016-03-07 MED ORDER — HYDROCOD POLST-CPM POLST ER 10-8 MG/5ML PO SUER: 5.0000 mL | Freq: Every evening | ORAL | 0 refills | Status: DC | PRN

## 2016-03-07 MED ORDER — AMOXICILLIN-POT CLAVULANATE 875-125 MG PO TABS: 1.0000 | ORAL_TABLET | Freq: Two times a day (BID) | ORAL | 0 refills | Status: DC

## 2016-03-07 NOTE — Discharge Instructions (Signed)
Take augmentin twice daily and complete the entire course. You can also take tussionex nightly to help with cough suppression while you sleep.  - Try to rest as much as possible and stay hydrated - Use intranasal saline and continue claritin - Follow up as scheduled with ENT or sooner if your symptoms worsen.

## 2016-03-07 NOTE — ED Triage Notes (Signed)
On Tuesday noticed sore throat, swelling in throat, pain into right ear.  Patient has had a runny nose and a bad cough.   Patient has not had a fever.  Has felt feverish.

## 2016-03-07 NOTE — ED Provider Notes (Signed)
[facility]  CSN: [id number] Arrival date & time: 03/07/16  1653  History   Chief Complaint Chief Complaint  Patient presents with  . URI   HPI Danielle Evans is a 25 y.o. female with a history of eustachian tube dysfunction s/p T-tube placement, recurrent tonsillitis s/p T&A, TMJ who presents for 1 week of worsening nasal drinage, congestion, sore throat and cough. Cough is not productive, no dyspnea or chest pain. Her right ear also hurts though this is a chronic issue which she was told was due to TMJ. She denies otorrhea. Has felt fevers and chills. These symptoms are not improved appreciably with OTC medications (NyQuil, DayQuil). She is a former smoker. Has follow up in 1 week with ENT. She's been taking care of a friend in the hospital recently and fears this may have caused the illness she's suffering from now.   Past Medical History:  Diagnosis Date  . Asthma    allergy induced  . Attention and concentration deficit   . Bipolar 1 disorder ([place])   . Ectopic pregnancy   . Exposure to STD   . GERD (gastroesophageal reflux disease)   . Headache    Migraines  . 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 Ovarian cyst   . Pneumonia   . Sleep apnea    childhood, adenoidectomy  . Vaginal Pap smear, abnormal 2014   Patient Active Problem List   Diagnosis Date Noted  . Pelvic pain in female 09/12/2011  . Ovarian cyst 09/12/2011  . Attention and concentration deficit   . Bipolar 1 disorder [facility])    Past Surgical History:  Procedure Laterality Date  . DILATION AND CURETTAGE OF UTERUS    . LAPAROSCOPIC UNILATERAL SALPINGECTOMY Left 11/21/2015   Procedure: LAPAROSCOPIC UNILATERAL SALPINGECTOMY;  Surgeon: Woodroe Mode, MD;  Location: [facility];  Service: Gynecology;  Laterality: Left;  . LAPAROSCOPY N/A 11/21/2015   Procedure: LAPAROSCOPY DIAGNOSTIC;  Surgeon: Woodroe Mode, MD;  Location: [facility];  Service:  Gynecology;  Laterality: N/A;  . NASAL SINUS SURGERY    . TONSILECTOMY, ADENOIDECTOMY, BILATERAL MYRINGOTOMY AND TUBES    . TONSILLECTOMY    . TUBAL LIGATION     OB History    Gravida Para Term Preterm AB Living   4       4     SAB TAB Ectopic Multiple Live Births   2   2         Home Medications    Prior to Admission medications   Medication Sig Start Date End Date Taking? Authorizing Provider  amoxicillin-clavulanate (AUGMENTIN) 875-125 MG tablet Take 1 tablet by mouth every 12 (twelve) hours. 03/07/16   Patrecia Pour, MD  chlorpheniramine-HYDROcodone (TUSSIONEX PENNKINETIC ER) 10-8 MG/5ML SUER Take 5 mLs by mouth at bedtime as needed for cough. 03/07/16   Patrecia Pour, MD  norgestimate-ethinyl estradiol (ORTHO-CYCLEN,SPRINTEC,PREVIFEM) 0.25-35 MG-MCG tablet Take 1 tablet by mouth daily. 02/20/16   Elvera Maria, CNM   Family History Family History  Problem Relation Age of Onset  . Heart disease Father   . Cancer Maternal Grandmother     breast  . Parkinsonism Maternal Grandfather   . Cancer Paternal Aunt     breast    Social History Social History  Substance Use Topics  . Smoking status: Former Smoker    Years: 3.00    Quit date: 10/23/2011  . Smokeless  tobacco: Never Used  . Alcohol use 6.6 oz/week    8 Glasses of wine, 3 Standard drinks or equivalent per week     Comment: occ   Allergies   Ceclor [cefaclor] Has taken augmentin in the recent past without intolerance/allergy  Review of Systems Review of Systems As above  Physical Exam Triage Vital Signs ED Triage Vitals  Enc Vitals Group     BP 03/07/16 1706 114/74     Pulse Rate 03/07/16 1706 86     Resp 03/07/16 1706 18     Temp 03/07/16 1706 98 F (36.7 C)     Temp Source 03/07/16 1706 Oral     SpO2 03/07/16 1706 99 %     Weight --      Height --      Head Circumference --      Peak Flow --      Pain Score 03/07/16 1704 7     Pain Loc --      Pain Edu? --      Excl. in [place]? --    No data  found.   Updated Vital Signs BP 114/74 (BP Location: Left Arm)   Pulse 86   Temp 98 F (36.7 C) (Oral)   Resp 18   LMP 01/14/2016 (Exact Date)   SpO2 99%    Physical Exam  Constitutional: She is oriented to person, place, and time. She appears well-developed and well-nourished. No distress.  HENT:  L TM with tube in place without otorrhea, no mastoid tenderness. Right TM with perforation without otorrhea, tender to palpation of tragus, +click at TMJ with bite. Nontender anterior cervical lymphadenopathy. Tonsils surgically absent without abscess. Turbinates boggy with thick mucoid discharge bilaterally.   Eyes: EOM are normal. Pupils are equal, round, and reactive to light. No scleral icterus.  Neck: Neck supple. No JVD present.  Cardiovascular: Normal rate, regular rhythm, normal heart sounds and intact distal pulses.   No murmur heard. Pulmonary/Chest: Effort normal and breath sounds normal. No respiratory distress. She has no wheezes. She has no rales.  Abdominal: Soft. Bowel sounds are normal. She exhibits no distension. There is no tenderness.  Musculoskeletal: Normal range of motion. She exhibits no edema or tenderness.  Lymphadenopathy:    She has no cervical adenopathy.  Neurological: She is alert and oriented to person, place, and time. She exhibits normal muscle tone.  Skin: Skin is warm and dry. Capillary refill takes less than 2 seconds.  Vitals reviewed.  UC Treatments / Results  Labs (all labs ordered are listed, but only abnormal results are displayed) Labs Reviewed - No data to display  EKG  EKG Interpretation None      Radiology No results found.  Procedures Procedures (including critical care time)  Medications Ordered in UC Medications - No data to display  Initial Impression / Assessment and Plan / UC Course  I have reviewed the triage vital signs and the nursing notes.  Pertinent labs & imaging results that were available during my care of the  patient were reviewed by me and considered in my medical decision making (see chart for details).  Final Clinical Impressions(s) / UC Diagnoses   Final diagnoses:  Upper respiratory tract infection, unspecified type  Recurrent sinusitis   25 y.o. female with extensive ENT history presenting for acute sinusitis which I see on EMR has been successfully treated in the past with augmentin. Will repeat augmentin course, and supportive therapies. Return precautions reviewed. She has preexisting ENT  follow up.   New Prescriptions New Prescriptions   AMOXICILLIN-CLAVULANATE (AUGMENTIN) 875-125 MG TABLET    Take 1 tablet by mouth every 12 (twelve) hours.   CHLORPHENIRAMINE-HYDROCODONE (TUSSIONEX PENNKINETIC ER) 10-8 MG/5ML SUER    Take 5 mLs by mouth at bedtime as needed for cough.     Patrecia Pour, MD 03/07/16 [phone]

## 2016-03-31 ENCOUNTER — Emergency Department (HOSPITAL_COMMUNITY)
Admission: EM | Admit: 2016-03-31 | Discharge: 2016-03-31 | Disposition: A | Discharge status: Home / Self Care | Payer: BLUE CROSS/BLUE SHIELD | Attending: Emergency Medicine | Admitting: Emergency Medicine

## 2016-03-31 ENCOUNTER — Encounter (HOSPITAL_COMMUNITY): Payer: Self-pay

## 2016-03-31 DIAGNOSIS — F909 Attention-deficit hyperactivity disorder, unspecified type: Secondary | ICD-10-CM | POA: Insufficient documentation

## 2016-03-31 DIAGNOSIS — J45909 Unspecified asthma, uncomplicated: Secondary | ICD-10-CM | POA: Diagnosis not present

## 2016-03-31 DIAGNOSIS — Z85830 Personal history of malignant neoplasm of bone: Secondary | ICD-10-CM | POA: Diagnosis not present

## 2016-03-31 DIAGNOSIS — K0889 Other specified disorders of teeth and supporting structures: Secondary | ICD-10-CM | POA: Insufficient documentation

## 2016-03-31 DIAGNOSIS — Z87891 Personal history of nicotine dependence: Secondary | ICD-10-CM | POA: Diagnosis not present

## 2016-03-31 MED ORDER — HYDROCODONE-ACETAMINOPHEN 5-325 MG PO TABS: ORAL_TABLET | ORAL | 0 refills | Status: DC

## 2016-03-31 MED ORDER — BUPIVACAINE-EPINEPHRINE (PF) 0.5% -1:200000 IJ SOLN
1.8000 mL | Freq: Once | INTRAMUSCULAR | Status: AC
  Administered 2016-03-31: 1.8 mL

## 2016-03-31 NOTE — ED Provider Notes (Signed)
[facility] DEPT Provider Note   CSN: [id number] Arrival date & time: 03/31/16  1526     History   Chief Complaint No chief complaint on file.   HPI   Blood pressure 143/83, pulse 82, temperature 98.2 F (36.8 C), temperature source Oral, resp. rate 20, SpO2 96 %.  Danielle Evans is a 25 y.o. female complaining ofPain to the bilateral lower front teeth and small amount of pain on the upper incisors onset 3 days ago after patient had a drawer sitting on her bed, she was pushing in stocking feet on hardwood floor and her feet slipped backwards her face went forward and her teeth hit the wooden floor causing a small fracture on the upper and lower teeth. She's been taking 800 mg ibuprofen every 8 hours at home with little relief and she states that she's been eating and drinking much less than normal secondary. She has an appointment with her dentist on Thursday  Past Medical History:  Diagnosis Date  . Asthma    allergy induced  . Attention and concentration deficit   . Bipolar 1 disorder ([place])   . Ectopic pregnancy   . Exposure to STD   . GERD (gastroesophageal reflux disease)   . Headache    Migraines  . 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 Ovarian cyst   . Pneumonia   . Sleep apnea    childhood, adenoidectomy  . Vaginal Pap smear, abnormal 2014    Patient Active Problem List   Diagnosis Date Noted  . Pelvic pain in female 09/12/2011  . Ovarian cyst 09/12/2011  . Attention and concentration deficit   . Bipolar 1 disorder [facility])     Past Surgical History:  Procedure Laterality Date  . DILATION AND CURETTAGE OF UTERUS    . LAPAROSCOPIC UNILATERAL SALPINGECTOMY Left 11/21/2015   Procedure: LAPAROSCOPIC UNILATERAL SALPINGECTOMY;  Surgeon: Woodroe Mode, MD;  Location: [facility];  Service: Gynecology;  Laterality: Left;  . LAPAROSCOPY N/A 11/21/2015   Procedure: LAPAROSCOPY DIAGNOSTIC;  Surgeon: Woodroe Mode, MD;  Location: [facility];  Service: Gynecology;  Laterality: N/A;  . NASAL SINUS SURGERY    . TONSILECTOMY, ADENOIDECTOMY, BILATERAL MYRINGOTOMY AND TUBES    . TONSILLECTOMY    . TUBAL LIGATION      OB History    Gravida Para Term Preterm AB Living   4       4     SAB TAB Ectopic Multiple Live Births   2   2           Home Medications    Prior to Admission medications   Medication Sig Start Date End Date Taking? Authorizing Provider  amoxicillin-clavulanate (AUGMENTIN) 875-125 MG tablet Take 1 tablet by mouth every 12 (twelve) hours. 03/07/16   Patrecia Pour, MD  chlorpheniramine-HYDROcodone (TUSSIONEX PENNKINETIC ER) 10-8 MG/5ML SUER Take 5 mLs by mouth at bedtime as needed for cough. 03/07/16   Patrecia Pour, MD  HYDROcodone-acetaminophen (NORCO/VICODIN) 5-325 MG tablet Take 1-2 tablets by mouth every 6 hours as needed for pain. 03/31/16   Meygan Kyser, PA-C  norgestimate-ethinyl estradiol (ORTHO-CYCLEN,SPRINTEC,PREVIFEM) 0.25-35 MG-MCG tablet Take 1 tablet by mouth daily. 02/20/16   Elvera Maria, CNM    Family History Family History  Problem Relation Age of Onset  . Heart disease Father   . Cancer Maternal Grandmother     breast  . Parkinsonism Maternal Grandfather   .  Cancer Paternal Aunt     breast     Social History Social History  Substance Use Topics  . Smoking status: Former Smoker    Years: 3.00    Quit date: 10/23/2011  . Smokeless tobacco: Never Used  . Alcohol use 6.6 oz/week    8 Glasses of wine, 3 Standard drinks or equivalent per week     Comment: occ     Allergies   Ceclor [cefaclor]   Review of Systems Review of Systems   Physical Exam Updated Vital Signs BP 143/83 (BP Location: Left Arm)   Pulse 82   Temp 98.2 F (36.8 C) (Oral)   Resp 20   SpO2 96%   Physical Exam  Constitutional: She is oriented to person, place, and time. She appears well-developed and well-nourished. No distress.  HENT:  Head: Normocephalic.    Mouth/Throat: Uvula is midline and oropharynx is clear and moist. No trismus in the jaw. No uvula swelling. No oropharyngeal exudate, posterior oropharyngeal edema, posterior oropharyngeal erythema or tonsillar abscesses.  No malocclusion or trismus. Generally good dentition with hairline fractures of teeth 24 and 9, no gingival swelling, erythema or tenderness to palpation. Patient is handling their secretions. There is no tenderness to palpation or firmness underneath tongue bilaterally. No trismus.    Eyes: Conjunctivae and EOM are normal.  Cardiovascular: Normal rate.   Pulmonary/Chest: Effort normal. No stridor.  Musculoskeletal: Normal range of motion.  Lymphadenopathy:    She has no cervical adenopathy.  Neurological: She is alert and oriented to person, place, and time.  Psychiatric: She has a normal mood and affect.  Nursing note and vitals reviewed.    ED Treatments / Results  Labs (all labs ordered are listed, but only abnormal results are displayed) Labs Reviewed - No data to display  EKG  EKG Interpretation None       Radiology No results found.  Procedures .Nerve Block Date/Time: 03/31/2016 4:12 PM Performed by: Monico Blitz Authorized by: Monico Blitz   Consent:    Consent obtained:  Verbal   Consent given by:  Patient Indications:    Indications:  Pain relief Location:    Nerve block body site: mouth. Skin anesthesia (see MAR for exact dosages):    Skin anesthesia method:  None Procedure details (see MAR for exact dosages):    Block needle gauge:  27 G   Anesthetic injected:  Bupivacaine 0.5% WITH epi   Steroid injected:  None   Injection procedure:  Anatomic landmarks identified   Paresthesia:  None Post-procedure details:    Dressing:  None   Outcome:  Anesthesia achieved   Patient tolerance of procedure:  Tolerated well, no immediate complications   (including critical care time)  Medications Ordered in ED Medications   bupivacaine-epinephrine (MARCAINE W/ EPI) 0.5% -1:200000 injection 1.8 mL (1.8 mLs Infiltration Given 03/31/16 1612)     Initial Impression / Assessment and Plan / ED Course  I have reviewed the triage vital signs and the nursing notes.  Pertinent labs & imaging results that were available during my care of the patient were reviewed by me and considered in my medical decision making (see chart for details).  Clinical Course     Vitals:   03/31/16 1531  BP: 143/83  Pulse: 82  Resp: 20  Temp: 98.2 F (36.8 C)  TempSrc: Oral  SpO2: 96%    Medications  bupivacaine-epinephrine (MARCAINE W/ EPI) 0.5% -1:200000 injection 1.8 mL (1.8 mLs Infiltration Given 03/31/16 1612)  Danielle Evans is 25 y.o. female presenting with Pain to frontal teeth after slipping and having a hairline fracture in both of these, periosteal dental block given with excellent relief. Patient given a short prescription for Vicodin and advised to follow closely with dentist.  Evaluation does not show pathology that would require ongoing emergent intervention or inpatient treatment. Pt is hemodynamically stable and mentating appropriately. Discussed findings and plan with patient/guardian, who agrees with care plan. All questions answered. Return precautions discussed and outpatient follow up given.      Final Clinical Impressions(s) / ED Diagnoses   Final diagnoses:  Pain, dental    New Prescriptions Discharge Medication List as of 03/31/2016  4:04 PM    START taking these medications   Details  HYDROcodone-acetaminophen (NORCO/VICODIN) 5-325 MG tablet Take 1-2 tablets by mouth every 6 hours as needed for pain., Print         Monico Blitz, PA-C 03/31/16 [place], MD 03/31/16 2348

## 2016-03-31 NOTE — Discharge Instructions (Signed)
Take vicodin for breakthrough pain, do not drink alcohol, drive, care for children or do other critical tasks while taking vicodin. ° °Please follow with your primary care doctor in the next 2 days for a check-up. They must obtain records for further management.  ° °Do not hesitate to return to the Emergency Department for any new, worsening or concerning symptoms.  ° °

## 2016-03-31 NOTE — ED Notes (Signed)
Declined W/C at D/C and was escorted to lobby by RN. 

## 2016-03-31 NOTE — ED Triage Notes (Signed)
Patient complains of facial pain and dental pain after hitting mouth on dresser drawer on Thursday. Has been using heat and ice with no relief. States bilateral tooth pain

## 2016-06-02 ENCOUNTER — Encounter (HOSPITAL_COMMUNITY): Payer: Self-pay

## 2016-06-02 ENCOUNTER — Emergency Department (HOSPITAL_COMMUNITY)
Admission: EM | Admit: 2016-06-02 | Discharge: 2016-06-02 | Disposition: A | Discharge status: Home / Self Care | Payer: BLUE CROSS/BLUE SHIELD | Attending: Emergency Medicine | Admitting: Emergency Medicine

## 2016-06-02 ENCOUNTER — Emergency Department (HOSPITAL_COMMUNITY): Payer: BLUE CROSS/BLUE SHIELD

## 2016-06-02 DIAGNOSIS — N9489 Other specified conditions associated with female genital organs and menstrual cycle: Secondary | ICD-10-CM | POA: Diagnosis not present

## 2016-06-02 DIAGNOSIS — Z79899 Other long term (current) drug therapy: Secondary | ICD-10-CM | POA: Diagnosis not present

## 2016-06-02 DIAGNOSIS — R05 Cough: Secondary | ICD-10-CM | POA: Insufficient documentation

## 2016-06-02 DIAGNOSIS — R059 Cough, unspecified: Secondary | ICD-10-CM

## 2016-06-02 DIAGNOSIS — J45909 Unspecified asthma, uncomplicated: Secondary | ICD-10-CM | POA: Diagnosis not present

## 2016-06-02 DIAGNOSIS — Z87891 Personal history of nicotine dependence: Secondary | ICD-10-CM | POA: Insufficient documentation

## 2016-06-02 LAB — HCG, QUANTITATIVE, PREGNANCY: hCG, Beta Chain, Quant, S: <1 m[IU]/mL (ref <5)

## 2016-06-02 LAB — COMPREHENSIVE METABOLIC PANEL
ALBUMIN: 4.6 g/dL (ref 3.5–5.0)
ALK PHOS: 67 U/L (ref 38–126)
ALT: 18 U/L (ref 14–54)
ANION GAP: 9 (ref 5–15)
AST: 19 U/L (ref 15–41)
BILIRUBIN TOTAL: 0.6 mg/dL (ref 0.3–1.2)
BUN: 13 mg/dL (ref 6–20)
CALCIUM: 9.9 mg/dL (ref 8.9–10.3)
CO2: 25 mmol/L (ref 22–32)
Chloride: 108 mmol/L (ref 101–111)
Creatinine, Ser: 1.01 mg/dL — ABNORMAL HIGH (ref 0.44–1.00)
GFR calc Af Amer: >60 mL/min (ref >60)
GFR calc non Af Amer: >60 mL/min (ref >60)
GLUCOSE: 91 mg/dL (ref 65–99)
Potassium: 4.4 mmol/L (ref 3.5–5.1)
SODIUM: 142 mmol/L (ref 135–145)
TOTAL PROTEIN: 6.9 g/dL (ref 6.5–8.1)

## 2016-06-02 LAB — CBC WITH DIFFERENTIAL/PLATELET
Basophils Absolute: 0 10*3/uL (ref 0.0–0.1)
Basophils Relative: 0 %
EOS PCT: 2 %
Eosinophils Absolute: 0.2 10*3/uL (ref 0.0–0.7)
HEMATOCRIT: 40.6 % (ref 36.0–46.0)
Hemoglobin: 13.6 g/dL (ref 12.0–15.0)
LYMPHS PCT: 38 %
Lymphs Abs: 4 10*3/uL (ref 0.7–4.0)
MCH: 30.4 pg (ref 26.0–34.0)
MCHC: 33.5 g/dL (ref 30.0–36.0)
MCV: 90.8 fL (ref 78.0–100.0)
MONO ABS: 0.5 10*3/uL (ref 0.1–1.0)
MONOS PCT: 5 %
Neutro Abs: 6 10*3/uL (ref 1.7–7.7)
Neutrophils Relative %: 55 %
PLATELETS: 320 10*3/uL (ref 150–400)
RBC: 4.47 MIL/uL (ref 3.87–5.11)
RDW: 12.6 % (ref 11.5–15.5)
WBC: 10.7 10*3/uL — ABNORMAL HIGH (ref 4.0–10.5)

## 2016-06-02 MED ORDER — ALBUTEROL SULFATE HFA 108 (90 BASE) MCG/ACT IN AERS: 2.0000 | INHALATION_SPRAY | Freq: Four times a day (QID) | RESPIRATORY_TRACT | 2 refills | Status: DC | PRN

## 2016-06-02 MED ORDER — BENZONATATE 100 MG PO CAPS: 100.0000 mg | ORAL_CAPSULE | Freq: Three times a day (TID) | ORAL | 0 refills | Status: DC

## 2016-06-02 NOTE — ED Triage Notes (Signed)
Pt endorse "I've had pneumonia on and off for the last 3 months" Pt endorses cough with yellow sputum. Pt has been treated with antibiotics and prednisone and "it keeps coming back" Pt also states that she cough up blood this evening. VSS.

## 2016-06-02 NOTE — ED Provider Notes (Signed)
[facility] DEPT Provider Note   CSN: [id number] Arrival date & time: 06/02/16  0250     History   Chief Complaint Chief Complaint  Patient presents with  . Cough    HPI Danielle Evans is a 26 y.o. female.  26 year old female presents with several day history of URI symptoms consisting of cough without fever, vomiting, diarrhea. Notes positive sick exposures. Denies any headache or neck pain or photophobia. Has not had a flu shot. Endorses history of recurrent pneumonia. Denies any dyspnea currently at this time. Has been using over-the-counter medications without relief.      Past Medical History:  Diagnosis Date  . Asthma    allergy induced  . Attention and concentration deficit   . Bipolar 1 disorder ([place])   . Ectopic pregnancy   . Exposure to STD   . GERD (gastroesophageal reflux disease)   . Headache    Migraines  . 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 Osteoid sarcoma ([facility])   . Ovarian cyst   . Pneumonia   . Sleep apnea    childhood, adenoidectomy  . Vaginal Pap smear, abnormal 2014    Patient Active Problem List   Diagnosis Date Noted  . Pelvic pain in female 09/12/2011  . Ovarian cyst 09/12/2011  . Attention and concentration deficit   . Bipolar 1 disorder [facility])     Past Surgical History:  Procedure Laterality Date  . DILATION AND CURETTAGE OF UTERUS    . LAPAROSCOPIC UNILATERAL SALPINGECTOMY Left 11/21/2015   Procedure: LAPAROSCOPIC UNILATERAL SALPINGECTOMY;  Surgeon: Woodroe Mode, MD;  Location: [facility];  Service: Gynecology;  Laterality: Left;  . LAPAROSCOPY N/A 11/21/2015   Procedure: LAPAROSCOPY DIAGNOSTIC;  Surgeon: Woodroe Mode, MD;  Location: [facility];  Service: Gynecology;  Laterality: N/A;  . NASAL SINUS SURGERY    . TONSILECTOMY, ADENOIDECTOMY, BILATERAL MYRINGOTOMY AND TUBES    . TONSILLECTOMY    . TUBAL LIGATION      OB History    Gravida Para Term Preterm AB Living   4       4     SAB  TAB Ectopic Multiple Live Births   2   2           Home Medications    Prior to Admission medications   Medication Sig Start Date End Date Taking? Authorizing Provider  albuterol (PROVENTIL HFA;VENTOLIN HFA) 108 (90 Base) MCG/ACT inhaler Inhale 2 puffs into the lungs every 6 (six) hours as needed for wheezing or shortness of breath. 06/02/16   Lacretia Leigh, MD  amoxicillin-clavulanate (AUGMENTIN) 875-125 MG tablet Take 1 tablet by mouth every 12 (twelve) hours. 03/07/16   Patrecia Pour, MD  benzonatate (TESSALON) 100 MG capsule Take 1 capsule (100 mg total) by mouth every 8 (eight) hours. 06/02/16   Lacretia Leigh, MD  chlorpheniramine-HYDROcodone [facility] ER) 10-8 MG/5ML SUER Take 5 mLs by mouth at bedtime as needed for cough. 03/07/16   Patrecia Pour, MD  HYDROcodone-acetaminophen (NORCO/VICODIN) 5-325 MG tablet Take 1-2 tablets by mouth every 6 hours as needed for pain. 03/31/16   Nicole Pisciotta, PA-C  norgestimate-ethinyl estradiol (ORTHO-CYCLEN,SPRINTEC,PREVIFEM) 0.25-35 MG-MCG tablet Take 1 tablet by mouth daily. 02/20/16   Elvera Maria, CNM    Family History Family History  Problem Relation Age of Onset  . Heart disease Father   . Cancer Maternal Grandmother     breast  . Parkinsonism Maternal Grandfather   .  Cancer Paternal Aunt     breast     Social History Social History  Substance Use Topics  . Smoking status: Former Smoker    Years: 3.00    Quit date: 10/23/2011  . Smokeless tobacco: Never Used  . Alcohol use 6.6 oz/week    8 Glasses of wine, 3 Standard drinks or equivalent per week     Comment: occ     Allergies   Ceclor [cefaclor]   Review of Systems Review of Systems  All other systems reviewed and are negative.    Physical Exam Updated Vital Signs BP 123/78   Pulse 68   Temp 98.1 F (36.7 C) (Oral)   Resp 16   Ht 5\' 7"  (1.702 m)   Wt 88.5 kg   LMP 05/27/2016 (Exact Date)   SpO2 100%   BMI 30.54 kg/m   Physical Exam    Constitutional: She is oriented to person, place, and time. She appears well-developed and well-nourished.  Non-toxic appearance. No distress.  HENT:  Head: Normocephalic and atraumatic.  Eyes: Conjunctivae, EOM and lids are normal. Pupils are equal, round, and reactive to light.  Neck: Normal range of motion. Neck supple. No tracheal deviation present. No thyroid mass present.  Cardiovascular: Normal rate, regular rhythm and normal heart sounds.  Exam reveals no gallop.   No murmur heard. Pulmonary/Chest: Effort normal and breath sounds normal. No stridor. No respiratory distress. She has no decreased breath sounds. She has no wheezes. She has no rhonchi. She has no rales.  Abdominal: Soft. Normal appearance and bowel sounds are normal. She exhibits no distension. There is no tenderness. There is no rebound and no CVA tenderness.  Musculoskeletal: Normal range of motion. She exhibits no edema or tenderness.  Neurological: She is alert and oriented to person, place, and time. She has normal strength. No cranial nerve deficit or sensory deficit. GCS eye subscore is 4. GCS verbal subscore is 5. GCS motor subscore is 6.  Skin: Skin is warm and dry. No abrasion and no rash noted.  Psychiatric: She has a normal mood and affect. Her speech is normal and behavior is normal.  Nursing note and vitals reviewed.    ED Treatments / Results  Labs (all labs ordered are listed, but only abnormal results are displayed) Labs Reviewed  COMPREHENSIVE METABOLIC PANEL - Abnormal; Notable for the following:       Result Value   Creatinine, Ser 1.01 (*)    All other components within normal limits  CBC WITH DIFFERENTIAL/PLATELET - Abnormal; Notable for the following:    WBC 10.7 (*)    All other components within normal limits  HCG, QUANTITATIVE, PREGNANCY    EKG  EKG Interpretation None       Radiology Dg Chest 2 View  Result Date: 06/02/2016 CLINICAL DATA:  Cough. EXAM: CHEST  2 VIEW COMPARISON:   None. FINDINGS: The cardiomediastinal contours are normal. The lungs are clear. Pulmonary vasculature is normal. No consolidation, pleural effusion, or pneumothorax. No acute osseous abnormalities are seen. IMPRESSION: No active cardiopulmonary disease. Electronically Signed   By: Jeb Levering M.D.   On: 06/02/2016 03:38    Procedures Procedures (including critical care time)  Medications Ordered in ED Medications - No data to display   Initial Impression / Assessment and Plan / ED Course  I have reviewed the triage vital signs and the nursing notes.  Pertinent labs & imaging results that were available during my care of the patient were reviewed by  me and considered in my medical decision making (see chart for details).     Patient likely URI. Chest x-ray without acute findings. Has had recurrent pneumonia will be given pulmonary follow-up  Final Clinical Impressions(s) / ED Diagnoses   Final diagnoses:  Cough    New Prescriptions New Prescriptions   ALBUTEROL (PROVENTIL HFA;VENTOLIN HFA) 108 (90 BASE) MCG/ACT INHALER    Inhale 2 puffs into the lungs every 6 (six) hours as needed for wheezing or shortness of breath.   BENZONATATE (TESSALON) 100 MG CAPSULE    Take 1 capsule (100 mg total) by mouth every 8 (eight) hours.     Lacretia Leigh, MD 06/02/16 [phone]

## 2016-06-04 ENCOUNTER — Institutional Professional Consult (permissible substitution): Payer: BLUE CROSS/BLUE SHIELD | Admitting: Internal Medicine

## 2016-06-11 ENCOUNTER — Encounter (HOSPITAL_COMMUNITY): Payer: Self-pay | Admitting: *Deleted

## 2016-06-11 ENCOUNTER — Inpatient Hospital Stay (HOSPITAL_COMMUNITY)
Admission: AD | Admit: 2016-06-11 | Discharge: 2016-06-11 | Disposition: A | Discharge status: Home / Self Care | Payer: BLUE CROSS/BLUE SHIELD | Source: Ambulatory Visit | Attending: Obstetrics and Gynecology | Admitting: Obstetrics and Gynecology

## 2016-06-11 DIAGNOSIS — B009 Herpesviral infection, unspecified: Secondary | ICD-10-CM | POA: Diagnosis not present

## 2016-06-11 DIAGNOSIS — B373 Candidiasis of vulva and vagina: Secondary | ICD-10-CM | POA: Diagnosis not present

## 2016-06-11 DIAGNOSIS — Z87891 Personal history of nicotine dependence: Secondary | ICD-10-CM | POA: Diagnosis not present

## 2016-06-11 DIAGNOSIS — R102 Pelvic and perineal pain: Secondary | ICD-10-CM | POA: Diagnosis present

## 2016-06-11 DIAGNOSIS — A6000 Herpesviral infection of urogenital system, unspecified: Secondary | ICD-10-CM | POA: Diagnosis not present

## 2016-06-11 DIAGNOSIS — B3731 Acute candidiasis of vulva and vagina: Secondary | ICD-10-CM

## 2016-06-11 LAB — POCT PREGNANCY, URINE: Preg Test, Ur: NEGATIVE

## 2016-06-11 LAB — URINALYSIS, ROUTINE W REFLEX MICROSCOPIC
BILIRUBIN URINE: NEGATIVE
Glucose, UA: NEGATIVE mg/dL
HGB URINE DIPSTICK: NEGATIVE
Ketones, ur: NEGATIVE mg/dL
Leukocytes, UA: NEGATIVE
Nitrite: NEGATIVE
PH: 5 (ref 5.0–8.0)
Protein, ur: NEGATIVE mg/dL
SPECIFIC GRAVITY, URINE: 1.013 (ref 1.005–1.030)

## 2016-06-11 LAB — WET PREP, GENITAL
Clue Cells Wet Prep HPF POC: NONE SEEN
Sperm: NONE SEEN
Trich, Wet Prep: NONE SEEN
YEAST WET PREP: NONE SEEN

## 2016-06-11 LAB — HEPATITIS B SURFACE ANTIGEN: HEP B S AG: NEGATIVE

## 2016-06-11 MED ORDER — VALACYCLOVIR HCL 1 G PO TABS: 1000.0000 mg | ORAL_TABLET | Freq: Two times a day (BID) | ORAL | 0 refills | Status: AC

## 2016-06-11 MED ORDER — FLUCONAZOLE 150 MG PO TABS: 150.0000 mg | ORAL_TABLET | Freq: Once | ORAL | 0 refills | Status: AC

## 2016-06-11 MED ORDER — LIDOCAINE HCL 4 % EX GEL: 1.0000 "application " | Freq: Three times a day (TID) | CUTANEOUS | 0 refills | Status: DC | PRN

## 2016-06-11 NOTE — Progress Notes (Signed)
Danielle Evans CNM in earlier to discuss test result and d/c plan. Written and verbal d/c instructions given and understanding voiced

## 2016-06-11 NOTE — Discharge Instructions (Signed)
Genital Herpes Genital herpes is a common sexually transmitted infection (STI) that is caused by a virus. The virus is spread from person to person through sexual contact. Infection can cause itching, blisters, and sores in the genital area or rectal area. This is called an outbreak. It affects both men and women. Genital herpes is particularly concerning for pregnant women because the virus can be passed to the baby during delivery and cause serious problems. Genital herpes is also a concern for people with a weakened defense (immune) system. Symptoms of genital herpes may last several days and then go away. However, the virus remains in your body, so you may have more outbreaks of symptoms in the future. The time between outbreaks varies and can be months or years. CAUSES Genital herpes is caused by a virus called herpes simplex virus (HSV) type 2 or HSV type 1. These viruses are contagious and are most often spread through sexual contact with an infected person. Sexual contact includes vaginal, anal, and oral sex. RISK FACTORS Risk factors for genital herpes include:  Being sexually active with multiple partners.  Having unprotected sex. SIGNS AND SYMPTOMS Symptoms may include:  Pain and itching in the genital area or rectal area.  Small red bumps that turn into blisters and then turn into sores.  Flu-like symptoms, including:  Fever.  Body aches.  Painful urination.  Vaginal discharge. DIAGNOSIS Genital herpes may be diagnosed by:  Physical exam.  Blood test.  Fluid culture test from an open sore. TREATMENT There is no cure for genital herpes. Oral antiviral medicines may be used to speed up healing and to help prevent the return of symptoms. These medicines can also help to reduce the spread of the virus to sexual partners. HOME CARE INSTRUCTIONS  Keep the affected areas dry and clean.  Take medicines only as directed by your health care provider.  Do not have sexual  contact during active infections. Genital herpes is contagious.  Practice safe sex. Latex condoms and female condoms may help to prevent the spread of the herpes virus.  Avoid rubbing or touching the blisters and sores. If you do touch the blister or sores:  Wash your hands thoroughly.  Do not touch your eyes afterward.  If you become pregnant, tell your health care provider if you have had genital herpes.  Keep all follow-up visits as directed by your health care provider. This is important. PREVENTION  Use condoms. Although anyone can contract genital herpes during sexual contact even with the use of a condom, a condom can provide some protection.  Avoid having multiple sexual partners.  Talk to your sexual partner about any symptoms and past history that either of you may have.  Get tested before you have sex. Ask your partner to do the same.  Recognize the symptoms of genital herpes. Do not have sexual contact if you notice these symptoms. SEEK MEDICAL CARE IF:  Your symptoms are not improving with medicine.  Your symptoms return.  You have new symptoms.  You have a fever.  You have abdominal pain.  You have redness, swelling, or pain in your eye. MAKE SURE YOU:  Understand these instructions.  Will watch your condition.  Will get help right away if you are not doing well or get worse. This information is not intended to replace advice given to you by your health care provider. Make sure you discuss any questions you have with your health care provider. Document Released: 04/12/2000 Document Revised: 05/06/2014 Document  Reviewed: 08/31/2013 Elsevier Interactive Patient Education  2017 [employer] Vaginal Yeast infection, Adult Vaginal yeast infection is a condition that causes soreness, swelling, and redness (inflammation) of the vagina. It also causes vaginal discharge. This is a common condition. Some women get this infection frequently. What are the  causes? This condition is caused by a change in the normal balance of the yeast (candida) and bacteria that live in the vagina. This change causes an overgrowth of yeast, which causes the inflammation. What increases the risk? This condition is more likely to develop in:  Women who take antibiotic medicines.  Women who have diabetes.  Women who take birth control pills.  Women who are pregnant.  Women who douche often.  Women who have a weak defense (immune) system.  Women who have been taking steroid medicines for a long time.  Women who frequently wear tight clothing. What are the signs or symptoms? Symptoms of this condition include:  White, thick vaginal discharge.  Swelling, itching, redness, and irritation of the vagina. The lips of the vagina (vulva) may be affected as well.  Pain or a burning feeling while urinating.  Pain during sex. How is this diagnosed? This condition is diagnosed with a medical history and physical exam. This will include a pelvic exam. Your health care provider will examine a sample of your vaginal discharge under a microscope. Your health care provider may send this sample for testing to confirm the diagnosis. How is this treated? This condition is treated with medicine. Medicines may be over-the-counter or prescription. You may be told to use one or more of the following:  Medicine that is taken orally.  Medicine that is applied as a cream.  Medicine that is inserted directly into the vagina (suppository). Follow these instructions at home:  Take or apply over-the-counter and prescription medicines only as told by your health care provider.  Do not have sex until your health care provider has approved. Tell your sex partner that you have a yeast infection. That person should go to his or her health care provider if he or she develops symptoms.  Do not wear tight clothes, such as pantyhose or tight pants.  Avoid using tampons until your  health care provider approves.  Eat more yogurt. This may help to keep your yeast infection from returning.  Try taking a sitz bath to help with discomfort. This is a warm water bath that is taken while you are sitting down. The water should only come up to your hips and should cover your buttocks. Do this 3-4 times per day or as told by your health care provider.  Do not douche.  Wear breathable, cotton underwear.  If you have diabetes, keep your blood sugar levels under control. Contact a health care provider if:  You have a fever.  Your symptoms go away and then return.  Your symptoms do not get better with treatment.  Your symptoms get worse.  You have new symptoms.  You develop blisters in or around your vagina.  You have blood coming from your vagina and it is not your menstrual period.  You develop pain in your abdomen. This information is not intended to replace advice given to you by your health care provider. Make sure you discuss any questions you have with your health care provider. Document Released: 01/23/2005 Document Revised: 09/27/2015 Document Reviewed: 10/17/2014 Elsevier Interactive Patient Education  2017 [employer].

## 2016-06-11 NOTE — MAU Provider Note (Signed)
History     CSN: [id number]  Arrival date and time: 06/11/16 1333   None     Chief Complaint  Patient presents with  . Back Pain  . Vaginal Pain   Non-pregnant female here with vaginal pain. Symptoms started last week. She also reports low back pain onset a few days later. She reports white/clear vaginal discharge without odor. She used Monistat and had no relief. She feels something in the genital region that may be a bump. No new sexual partner in 9 months. She is contracepting with Sprintec and condoms. She reports remote hc of chlamydia and oral HSV. She reports dysuria and frequency x5 days. No urinary urgency or hematuria.   Past Medical History:  Diagnosis Date  . Attention and concentration deficit   . Bipolar 1 disorder ([place])   . Ectopic pregnancy   . Exposure to STD   . GERD (gastroesophageal reflux disease)   . Headache    Migraines  . 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 Ovarian cyst   . Pneumonia   . Sleep apnea    childhood, adenoidectomy  . Vaginal Pap smear, abnormal 2014    Past Surgical History:  Procedure Laterality Date  . DILATION AND CURETTAGE OF UTERUS    . LAPAROSCOPIC UNILATERAL SALPINGECTOMY Left 11/21/2015   Procedure: LAPAROSCOPIC UNILATERAL SALPINGECTOMY;  Surgeon: Woodroe Mode, MD;  Location: [facility];  Service: Gynecology;  Laterality: Left;  . LAPAROSCOPY N/A 11/21/2015   Procedure: LAPAROSCOPY DIAGNOSTIC;  Surgeon: Woodroe Mode, MD;  Location: [facility];  Service: Gynecology;  Laterality: N/A;  . NASAL SINUS SURGERY    . TONSILECTOMY, ADENOIDECTOMY, BILATERAL MYRINGOTOMY AND TUBES    . TONSILLECTOMY    . TUBAL LIGATION      Family History  Problem Relation Age of Onset  . Heart disease Father   . Cancer Maternal Grandmother     breast  . Parkinsonism Maternal Grandfather   . Cancer Paternal Aunt     breast     Social History  Substance Use Topics  . Smoking  status: Former Smoker    Years: 3.00    Quit date: 10/23/2011  . Smokeless tobacco: Never Used  . Alcohol use 6.6 oz/week    8 Glasses of wine, 3 Standard drinks or equivalent per week     Comment: occ    Allergies:  Allergies  Allergen Reactions  . Ceclor [Cefaclor] Hives    Prescriptions Prior to Admission  Medication Sig Dispense Refill Last Dose  . albuterol (PROVENTIL HFA;VENTOLIN HFA) 108 (90 Base) MCG/ACT inhaler Inhale 2 puffs into the lungs every 6 (six) hours as needed for wheezing or shortness of breath. 1 Inhaler 2   . amoxicillin-clavulanate (AUGMENTIN) 875-125 MG tablet Take 1 tablet by mouth every 12 (twelve) hours. 14 tablet 0   . benzonatate (TESSALON) 100 MG capsule Take 1 capsule (100 mg total) by mouth every 8 (eight) hours. 21 capsule 0   . chlorpheniramine-HYDROcodone (TUSSIONEX PENNKINETIC ER) 10-8 MG/5ML SUER Take 5 mLs by mouth at bedtime as needed for cough. 115 mL 0   . HYDROcodone-acetaminophen (NORCO/VICODIN) 5-325 MG tablet Take 1-2 tablets by mouth every 6 hours as needed for pain. 17 tablet 0   . norgestimate-ethinyl estradiol (ORTHO-CYCLEN,SPRINTEC,PREVIFEM) 0.25-35 MG-MCG tablet Take 1 tablet by mouth daily. 1 Package 11     Review of Systems  Constitutional: Negative for fever.  Gastrointestinal: Negative.  Genitourinary: Positive for dysuria, frequency, genital sores, vaginal discharge and vaginal pain. Negative for hematuria and urgency.   Physical Exam   Blood pressure 116/82, pulse 93, temperature 97.9 F (36.6 C), resp. rate 18, height 5\' 7"  (1.702 m), weight 101.6 kg (224 lb), last menstrual period 05/27/2016.  Physical Exam  Nursing note and vitals reviewed. Constitutional: She is oriented to person, place, and time. She appears well-developed and well-nourished.  HENT:  Head: Normocephalic and atraumatic.  Neck: Normal range of motion.  Respiratory: Effort normal.  GI: Soft. She exhibits no distension and no mass. There is no  tenderness. There is no rebound, no guarding and no CVA tenderness.  Genitourinary:     Genitourinary Comments: Genitalia is shaved External: no lesions or erythema Vagina: rugated, thin yellow discharge in cul de sac and white curdy discharge at introitus   Musculoskeletal: Normal range of motion.       Thoracic back: Normal. She exhibits no tenderness.       Lumbar back: Normal. She exhibits no tenderness.  Neurological: She is alert and oriented to person, place, and time.  Skin: Skin is warm and dry.  Psychiatric: She has a normal mood and affect.   Results for orders placed or performed during the hospital encounter of 06/11/16 (from the past 24 hour(s))  Urinalysis, Routine w reflex microscopic     Status: Abnormal   Collection Time: 06/11/16  2:07 PM  Result Value Ref Range   Color, Urine STRAW (A) YELLOW   APPearance CLEAR CLEAR   Specific Gravity, Urine 1.013 1.005 - 1.030   pH 5.0 5.0 - 8.0   Glucose, UA NEGATIVE NEGATIVE mg/dL   Hgb urine dipstick NEGATIVE NEGATIVE   Bilirubin Urine NEGATIVE NEGATIVE   Ketones, ur NEGATIVE NEGATIVE mg/dL   Protein, ur NEGATIVE NEGATIVE mg/dL   Nitrite NEGATIVE NEGATIVE   Leukocytes, UA NEGATIVE NEGATIVE  Pregnancy, urine POC     Status: None   Collection Time: 06/11/16  2:14 PM  Result Value Ref Range   Preg Test, Ur NEGATIVE NEGATIVE  Wet prep, genital     Status: Abnormal   Collection Time: 06/11/16  2:20 PM  Result Value Ref Range   Yeast Wet Prep HPF POC NONE SEEN NONE SEEN   Trich, Wet Prep NONE SEEN NONE SEEN   Clue Cells Wet Prep HPF POC NONE SEEN NONE SEEN   WBC, Wet Prep HPF POC MANY (A) NONE SEEN   Sperm NONE SEEN    MAU Course  Procedures  MDM Labs ordered and reviewed. No evidence of UTI. Will treat for yeast and HSV based on PE. Cultures pending. Stable for discharge home.   Assessment and Plan   1. Yeast vaginitis   2. HSV (herpes simplex virus) infection    Discharge home Follow up in [place] as needed Rx  Diflucan Rx Valtrex Rx Lidocaine gel  Allergies as of 06/11/2016      Reactions   Ceclor [cefaclor] Hives      Medication List    STOP taking these medications   amoxicillin-clavulanate 875-125 MG tablet Commonly known as:  AUGMENTIN   chlorpheniramine-HYDROcodone 10-8 MG/5ML Suer Commonly known as:  TUSSIONEX PENNKINETIC ER   HYDROcodone-acetaminophen 5-325 MG tablet Commonly known as:  NORCO/VICODIN     TAKE these medications   albuterol 108 (90 Base) MCG/ACT inhaler Commonly known as:  PROVENTIL HFA;VENTOLIN HFA Inhale 2 puffs into the lungs every 6 (six) hours as needed for wheezing or shortness of breath.  benzonatate 100 MG capsule Commonly known as:  TESSALON Take 1 capsule (100 mg total) by mouth every 8 (eight) hours.   ergocalciferol 50000 units capsule Commonly known as:  VITAMIN D2 Take 50,000 Units by mouth once a week.   fluconazole 150 MG tablet Commonly known as:  DIFLUCAN Take 1 tablet (150 mg total) by mouth once.   FLUoxetine 20 MG tablet Commonly known as:  PROZAC Take 20 mg by mouth daily.   Lidocaine HCl 4 % Gel Apply 1 application topically 3 (three) times daily as needed.   norgestimate-ethinyl estradiol 0.25-35 MG-MCG tablet Commonly known as:  ORTHO-CYCLEN,SPRINTEC,PREVIFEM Take 1 tablet by mouth daily.   valACYclovir 1000 MG tablet Commonly known as:  VALTREX Take 1 tablet (1,000 mg total) by mouth 2 (two) times daily.      Julianne Handler, CNM 06/11/2016, 2:04 PM

## 2016-06-11 NOTE — MAU Note (Signed)
Was treated for pneumonia about 62months ago with antibiotics. Now having extreme vag pain and thinks may have yeast infection. Also having R flank pain for a week. Tried monistat 7 and did not help. Have some vaginal sores. Does have oral herpes at times but not now.

## 2016-06-12 LAB — GC/CHLAMYDIA PROBE AMP (~~LOC~~) NOT AT ARMC
Chlamydia: NEGATIVE
Neisseria Gonorrhea: NEGATIVE

## 2016-06-12 LAB — HIV ANTIBODY (ROUTINE TESTING W REFLEX): HIV SCREEN 4TH GENERATION: NONREACTIVE

## 2016-06-12 LAB — HSV(HERPES SIMPLEX VRS) I + II AB-IGG: HSV 2 Glycoprotein G Ab, IgG: 9.43 index — ABNORMAL HIGH (ref 0.00–0.90)

## 2016-06-12 LAB — HSV(HERPES SIMPLEX VRS) I + II AB-IGM: HSVI/II Comb IgM: <0.91 Ratio (ref 0.00–0.90)

## 2016-06-12 LAB — RPR: RPR: NONREACTIVE

## 2016-06-13 LAB — HSV CULTURE AND TYPING

## 2016-06-25 ENCOUNTER — Institutional Professional Consult (permissible substitution): Payer: BLUE CROSS/BLUE SHIELD | Admitting: Internal Medicine

## 2016-06-25 ENCOUNTER — Ambulatory Visit (HOSPITAL_COMMUNITY)
Admission: EM | Admit: 2016-06-25 | Discharge: 2016-06-25 | Disposition: A | Discharge status: Home / Self Care | Payer: BLUE CROSS/BLUE SHIELD | Attending: Family Medicine | Admitting: Family Medicine

## 2016-06-25 ENCOUNTER — Encounter (HOSPITAL_COMMUNITY): Payer: Self-pay | Admitting: Emergency Medicine

## 2016-06-25 ENCOUNTER — Ambulatory Visit (INDEPENDENT_AMBULATORY_CARE_PROVIDER_SITE_OTHER): Payer: BLUE CROSS/BLUE SHIELD

## 2016-06-25 DIAGNOSIS — J111 Influenza due to unidentified influenza virus with other respiratory manifestations: Secondary | ICD-10-CM

## 2016-06-25 DIAGNOSIS — R69 Illness, unspecified: Secondary | ICD-10-CM | POA: Diagnosis not present

## 2016-06-25 MED ORDER — IPRATROPIUM BROMIDE 0.06 % NA SOLN: 2.0000 | Freq: Four times a day (QID) | NASAL | 2 refills | Status: DC

## 2016-06-25 NOTE — ED Provider Notes (Signed)
[facility]    CSN: [id number] Arrival date & time: 06/25/16  1530     History   Chief Complaint Chief Complaint  Patient presents with  . Cough    HPI Danielle Evans is a 26 y.o. female.   The history is provided by the patient.  Cough  Cough characteristics:  Non-productive and dry Severity:  Moderate Onset quality:  Gradual Duration:  1 day Progression:  Worsening Chronicity:  Recurrent Smoker: no   Context: sick contacts   Context comment:  S/p cap sev wks ago now with recurrence of fever, cough after sev coworkers out with flu, Relieved by:  Nothing Ineffective treatments:  Cough suppressants Associated symptoms: chills, fever, myalgias, rhinorrhea and sore throat   Associated symptoms: no wheezing     Past Medical History:  Diagnosis Date  . Attention and concentration deficit   . Bipolar 1 disorder ([place])   . Ectopic pregnancy   . Exposure to STD   . GERD (gastroesophageal reflux disease)   . Headache    Migraines  . 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 Ovarian cyst   . Pneumonia   . Sleep apnea    childhood, adenoidectomy  . Vaginal Pap smear, abnormal 2014    Patient Active Problem List   Diagnosis Date Noted  . Pelvic pain in female 09/12/2011  . Ovarian cyst 09/12/2011  . Attention and concentration deficit   . Bipolar 1 disorder [facility])     Past Surgical History:  Procedure Laterality Date  . DILATION AND CURETTAGE OF UTERUS    . LAPAROSCOPIC UNILATERAL SALPINGECTOMY Left 11/21/2015   Procedure: LAPAROSCOPIC UNILATERAL SALPINGECTOMY;  Surgeon: Woodroe Mode, MD;  Location: [facility];  Service: Gynecology;  Laterality: Left;  . LAPAROSCOPY N/A 11/21/2015   Procedure: LAPAROSCOPY DIAGNOSTIC;  Surgeon: Woodroe Mode, MD;  Location: [facility];  Service: Gynecology;  Laterality: N/A;  . NASAL SINUS SURGERY    . TONSILECTOMY, ADENOIDECTOMY, BILATERAL MYRINGOTOMY AND TUBES     . TONSILLECTOMY    . TUBAL LIGATION      OB History    Gravida Para Term Preterm AB Living   4       4     SAB TAB Ectopic Multiple Live Births   2   2           Home Medications    Prior to Admission medications   Medication Sig Start Date End Date Taking? Authorizing Provider  albuterol (PROVENTIL HFA;VENTOLIN HFA) 108 (90 Base) MCG/ACT inhaler Inhale 2 puffs into the lungs every 6 (six) hours as needed for wheezing or shortness of breath. 06/02/16  Yes Lacretia Leigh, MD  benzonatate (TESSALON) 100 MG capsule Take 1 capsule (100 mg total) by mouth every 8 (eight) hours. 06/02/16  Yes Lacretia Leigh, MD  ergocalciferol (VITAMIN D2) 50000 units capsule Take 50,000 Units by mouth once a week.   Yes Historical Provider, MD  FLUoxetine (PROZAC) 20 MG tablet Take 20 mg by mouth daily.   Yes Historical Provider, MD  norgestimate-ethinyl estradiol (ORTHO-CYCLEN,SPRINTEC,PREVIFEM) 0.25-35 MG-MCG tablet Take 1 tablet by mouth daily. 02/20/16  Yes Lisa A Leftwich-Kirby, CNM  ipratropium (ATROVENT) 0.06 % nasal spray Place 2 sprays into both nostrils 4 (four) times daily. 06/25/16   Billy Fischer, MD  Lidocaine HCl 4 % GEL Apply 1 application topically 3 (three) times daily as needed. 06/11/16   Julianne Handler,  CNM    Family History Family History  Problem Relation Age of Onset  . Heart disease Father   . Cancer Maternal Grandmother     breast  . Parkinsonism Maternal Grandfather   . Cancer Paternal Aunt     breast     Social History Social History  Substance Use Topics  . Smoking status: Former Smoker    Years: 3.00    Quit date: 10/23/2011  . Smokeless tobacco: Never Used  . Alcohol use 6.6 oz/week    8 Glasses of wine, 3 Standard drinks or equivalent per week     Comment: occ     Allergies   Ceclor [cefaclor]   Review of Systems Review of Systems  Constitutional: Positive for chills and fever.  HENT: Positive for rhinorrhea and sore throat.   Respiratory: Positive for  cough. Negative for wheezing.   Cardiovascular: Negative.   Gastrointestinal: Negative.   Genitourinary: Negative.   Musculoskeletal: Positive for myalgias.  All other systems reviewed and are negative.    Physical Exam Triage Vital Signs ED Triage Vitals [06/25/16 1555]  Enc Vitals Group     BP 127/90     Pulse Rate 79     Resp      Temp 98.4 F (36.9 C)     Temp Source Oral     SpO2 99 %     Weight      Height      Head Circumference      Peak Flow      Pain Score      Pain Loc      Pain Edu?      Excl. in [place]?    No data found.   Updated Vital Signs BP 127/90 (BP Location: Left Arm)   Pulse 79   Temp 98.4 F (36.9 C) (Oral)   LMP 05/20/2016 (Exact Date)   SpO2 99%   Visual Acuity Right Eye Distance:   Left Eye Distance:   Bilateral Distance:    Right Eye Near:   Left Eye Near:    Bilateral Near:     Physical Exam  Constitutional: She is oriented to person, place, and time. She appears well-developed and well-nourished. She appears distressed.  HENT:  Right Ear: External ear normal.  Left Ear: External ear normal.  Nose: Nose normal.  Mouth/Throat: Oropharynx is clear and moist.  Eyes: Pupils are equal, round, and reactive to light.  Neck: Normal range of motion. Neck supple.  Cardiovascular: Normal rate, regular rhythm, normal heart sounds and intact distal pulses.   Pulmonary/Chest: Effort normal and breath sounds normal. She has no wheezes. She has no rales.  Abdominal: Soft. Bowel sounds are normal.  Lymphadenopathy:    She has no cervical adenopathy.  Neurological: She is alert and oriented to person, place, and time.  Skin: Skin is warm and dry.  Nursing note and vitals reviewed.    UC Treatments / Results  Labs (all labs ordered are listed, but only abnormal results are displayed) Labs Reviewed - No data to display  EKG  EKG Interpretation None       Radiology Dg Chest 2 View  Result Date: 06/25/2016 CLINICAL DATA:  Onset of  fever as well or shortness of breath and cough 3 days ago. Discontinued smoking 5 years ago. EXAM: CHEST  2 VIEW COMPARISON:  PA and lateral chest x-ray of June 02, 2016. FINDINGS: The lungs are adequately inflated. The interstitial markings are coarse though stable. There is  no alveolar infiltrate or pleural effusion. The heart and pulmonary vascularity are normal. The mediastinum is normal in width. The bony thorax is unremarkable. IMPRESSION: Mild interstitial prominence unchanged from the previous study. No acute pneumonia nor other cardiopulmonary abnormality. Electronically Signed   By: David  Martinique M.D.   On: 06/25/2016 16:39    Procedures Procedures (including critical care time)  Medications Ordered in UC Medications - No data to display   Initial Impression / Assessment and Plan / UC Course  I have reviewed the triage vital signs and the nursing notes.  Pertinent labs & imaging results that were available during my care of the patient were reviewed by me and considered in my medical decision making (see chart for details).       Final Clinical Impressions(s) / UC Diagnoses   Final diagnoses:  Influenza-like illness    New Prescriptions New Prescriptions   IPRATROPIUM (ATROVENT) 0.06 % NASAL SPRAY    Place 2 sprays into both nostrils 4 (four) times daily.     Billy Fischer, MD 06/25/16 [phone]

## 2016-06-25 NOTE — ED Triage Notes (Signed)
Pt has been sick for over a month.  She was prescribed abx for pna a few weeks ago.  Was seen in the ED three weeks ago where they did an xray and determined she was almost cleared of the pna.  Pt was given Tessalon but it is not helping.  Pt cannot sleep because of the cough and SOB.  She has an appointment with pulmonary tomorrow.   Pt had an inhaler but that has run out.

## 2016-06-26 ENCOUNTER — Encounter: Payer: Self-pay | Admitting: Pulmonary Disease

## 2016-06-26 ENCOUNTER — Ambulatory Visit (INDEPENDENT_AMBULATORY_CARE_PROVIDER_SITE_OTHER): Payer: BLUE CROSS/BLUE SHIELD | Admitting: Pulmonary Disease

## 2016-06-26 VITALS — BP 120/80 | HR 63 | Ht 67.0 in | Wt 225.0 lb

## 2016-06-26 DIAGNOSIS — R05 Cough: Secondary | ICD-10-CM | POA: Diagnosis not present

## 2016-06-26 DIAGNOSIS — R059 Cough, unspecified: Secondary | ICD-10-CM

## 2016-06-26 LAB — NITRIC OXIDE: Nitric Oxide: 28

## 2016-06-26 MED ORDER — FLUTICASONE PROPIONATE 50 MCG/ACT NA SUSP: 2.0000 | Freq: Every day | NASAL | 2 refills | Status: DC

## 2016-06-26 NOTE — Patient Instructions (Addendum)
We will give you a Flonase nasal spray and start on chlorpheniramine 8 mg 3 times daily We'll give you albuterol rescue inhaler  We will

## 2016-06-26 NOTE — Addendum Note (Signed)
Addended by: Maryanna Shape A on: 06/26/2016 05:08 PM   Modules accepted: Orders

## 2016-06-26 NOTE — Addendum Note (Signed)
Addended by: Maryanna Shape A on: 06/26/2016 04:29 PM   Modules accepted: Orders

## 2016-06-26 NOTE — Progress Notes (Addendum)
Danielle Evans    [id number]    27-Nov-1990  Primary Care Physician:LONG,SCOTT, PA-C  Referring Physician: Shanon Rosser, PA-C [address], [place] [id number]  Chief complaint:  Recurrent pneumonia  HPI: Mrs. Schnabel is a 26 year old with no significant past medical history. She has recurrent attacks of bronchitis over the past winter. She is chronic nonproductive cough and reports fevers, chills the past weekend. 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 She denies any myalgias, arthralgias, malaise, fatigue. She was seen in the ED yesterday and a chest x-ray was taken. She was sent home on Atrovent nasal spray.  She has occasional sinus symptoms, postnasal drip. She has history of sleep apnea as a child which improved after her adenoids were removed. She denies heartburn symptoms. She works as a Freight forwarder at [employer] with no exposures at work. She lives in an older home and does not notice any more dishes. She has a cat at home but does not report any allergies to it.  Outpatient Encounter Prescriptions as of 06/26/2016  Medication Sig  . albuterol (PROVENTIL HFA;VENTOLIN HFA) 108 (90 Base) MCG/ACT inhaler Inhale 2 puffs into the lungs every 6 (six) hours as needed for wheezing or shortness of breath.  . benzonatate (TESSALON) 100 MG capsule Take 1 capsule (100 mg total) by mouth every 8 (eight) hours.  . ergocalciferol (VITAMIN D2) 50000 units capsule Take 50,000 Units by mouth once a week.  Marland Kitchen FLUoxetine (PROZAC) 20 MG tablet Take 20 mg by mouth daily.  Marland Kitchen ipratropium (ATROVENT) 0.06 % nasal spray Place 2 sprays into both nostrils 4 (four) times daily.  . Lidocaine HCl 4 % GEL Apply 1 application topically 3 (three) times daily as needed.  . norgestimate-ethinyl estradiol (ORTHO-CYCLEN,SPRINTEC,PREVIFEM) 0.25-35 MG-MCG tablet Take 1 tablet by mouth daily.   No facility-administered encounter medications on file as of 06/26/2016.      Allergies as of 06/26/2016 - Review Complete 06/26/2016  Allergen Reaction Noted  . Ceclor [cefaclor] Hives 09/12/2011    Past Medical History:  Diagnosis Date  . Attention and concentration deficit   . Bipolar 1 disorder ([place])   . Ectopic pregnancy   . Exposure to STD   . GERD (gastroesophageal reflux disease)   . Headache    Migraines  . 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 Ovarian cyst   . Pneumonia   . Sleep apnea    childhood, adenoidectomy  . Vaginal Pap smear, abnormal 2014    Past Surgical History:  Procedure Laterality Date  . DILATION AND CURETTAGE OF UTERUS    . LAPAROSCOPIC UNILATERAL SALPINGECTOMY Left 11/21/2015   Procedure: LAPAROSCOPIC UNILATERAL SALPINGECTOMY;  Surgeon: Woodroe Mode, MD;  Location: [facility];  Service: Gynecology;  Laterality: Left;  . LAPAROSCOPY N/A 11/21/2015   Procedure: LAPAROSCOPY DIAGNOSTIC;  Surgeon: Woodroe Mode, MD;  Location: [facility];  Service: Gynecology;  Laterality: N/A;  . NASAL SINUS SURGERY    . TONSILECTOMY, ADENOIDECTOMY, BILATERAL MYRINGOTOMY AND TUBES    . TONSILLECTOMY    . TUBAL LIGATION      Family History  Problem Relation Age of Onset  . Heart disease Father   . Cancer Maternal Grandmother     breast  . Parkinsonism Maternal Grandfather   . Cancer Paternal Aunt     breast     Social History  Social History  . Marital status: Single    Spouse name: N/A  . Number of children: N/A  . Years of education: N/A   Occupational History  . Not on file.   Social History Main Topics  . Smoking status: Former Smoker    Years: 3.00    Quit date: 10/23/2011  . Smokeless tobacco: Never Used  . Alcohol use 6.6 oz/week    8 Glasses of wine, 3 Standard drinks or equivalent per week     Comment: occ  . Drug use: No     Comment: marijuana occasionally   . Sexual activity: Yes    Partners: Male    Birth control/ protection: Condom, Pill      Comment: Nuvaring   Other Topics Concern  . Not on file   Social History Narrative  . No narrative on file   Review of systems: Review of Systems  Constitutional: Negative for fever and chills.  HENT: Negative.   Eyes: Negative for blurred vision.  Respiratory: as per HPI  Cardiovascular: Negative for chest pain and palpitations.  Gastrointestinal: Negative for vomiting, diarrhea, blood per rectum. Genitourinary: Negative for dysuria, urgency, frequency and hematuria.  Musculoskeletal: Negative for myalgias, back pain and joint pain.  Skin: Negative for itching and rash.  Neurological: Negative for dizziness, tremors, focal weakness, seizures and loss of consciousness.  Endo/Heme/Allergies: Negative for environmental allergies.  Psychiatric/Behavioral: Negative for depression, suicidal ideas and hallucinations.  All other systems reviewed and are negative.  Physical Exam: Blood pressure 120/80, pulse 63, height 5\' 7"  (1.702 m), weight 102.1 kg (225 lb), last menstrual period 05/27/2016, SpO2 100 %. Gen:      No acute distress HEENT:  EOMI, sclera anicteric Neck:     No masses; no thyromegaly Lungs:    Clear to auscultation bilaterally; normal respiratory effort CV:         Regular rate and rhythm; no murmurs Abd:      + bowel sounds; soft, non-tender; no palpable masses, no distension Ext:    No edema; adequate peripheral perfusion Skin:      Warm and dry; no rash Neuro: alert and oriented x 3 Psych: normal mood and affect  Data Reviewed: CXR 06/25/16-mild increase in bronchospasm vascular marking. No definite infiltrate or fluid CXR 06/02/16-no acute cardiopulmonary abnormality All the images personally reviewed.  Assessment:  Recurrent bronchitis. ? Asthma Upper airway cough syndrome  Suspect cough is from upper airway syndrome exacerbated by postnasal drip. She may have asthma as well. I'll evaluate by getting an FENO, blood in the profiles and CBC with differential.  She'll need PFTs but will wait until she is recovered from the acute episode.  I'll treat the postnasal drip with Flonase nasal spray and chlorphentermine antihistamine. She will continue albuterol rescue inhaler. I educated her on behavioral changes to deal with cough including conscious suppression of the urge to cough, use of throat lozenges.  Flu like illness Check Flu swab  Plan/Recommendations: - Check FENO - Continue albuterol PRN - Flonase and chlorpheniramine 8 mg tid - Flu test  Marshell Garfinkel MD [facility] Pager [phone] 06/26/2016, 3:19 PM  CC: Shanon Rosser, PA-C   Addendum: FENO- 28 Flu test- negative

## 2016-06-26 NOTE — Progress Notes (Deleted)
   Subjective:    Patient ID: Danielle Evans, female    DOB: [birth year], 26 y.o.   MRN: [id number]  HPI    Review of Systems  Constitutional: Negative for fever and unexpected weight change.  HENT: Positive for congestion, dental problem, ear pain, sneezing and sore throat. Negative for nosebleeds, postnasal drip, rhinorrhea, sinus pressure and trouble swallowing.   Eyes: Negative for redness and itching.  Respiratory: Positive for cough, chest tightness and shortness of breath. Negative for wheezing.   Cardiovascular: Negative for palpitations and leg swelling.  Gastrointestinal: Negative for nausea and vomiting.  Genitourinary: Negative for dysuria.  Musculoskeletal: Negative for joint swelling.  Skin: Positive for rash.  Neurological: Negative for headaches.  Hematological: Does not bruise/bleed easily.  Psychiatric/Behavioral: Negative for dysphoric mood. The patient is nervous/anxious.        Objective:   Physical Exam        Assessment & Plan:

## 2016-06-27 LAB — POCT INFLUENZA A/B

## 2016-06-27 NOTE — Addendum Note (Signed)
Addended by: Maryanna Shape A on: 06/27/2016 02:13 PM   Modules accepted: Orders

## 2016-07-24 ENCOUNTER — Ambulatory Visit: Payer: BLUE CROSS/BLUE SHIELD

## 2016-07-25 ENCOUNTER — Encounter: Payer: Self-pay | Admitting: *Deleted

## 2016-07-25 ENCOUNTER — Encounter: Payer: Self-pay | Admitting: Family Medicine

## 2016-07-25 ENCOUNTER — Ambulatory Visit (INDEPENDENT_AMBULATORY_CARE_PROVIDER_SITE_OTHER): Payer: BLUE CROSS/BLUE SHIELD | Admitting: *Deleted

## 2016-07-25 DIAGNOSIS — Z3201 Encounter for pregnancy test, result positive: Secondary | ICD-10-CM

## 2016-07-25 DIAGNOSIS — Z3490 Encounter for supervision of normal pregnancy, unspecified, unspecified trimester: Secondary | ICD-10-CM

## 2016-07-25 NOTE — Progress Notes (Signed)
Patient presents to clinic for pregnancy test which is positive. Patient informed of results. Patient very emotional, not sure how to feel about the pregnancy. Discussed options for prenatal care, advised patient to take her time in coming to a decision about the pregnancy. Advised going on and starting prenatal vitamins and avoid smoking, drug or alcohol. Patient voiced understanding. To front desk for pregnancy verification letter.

## 2016-07-26 LAB — POCT PREGNANCY, URINE: PREG TEST UR: POSITIVE — AB

## 2016-08-03 ENCOUNTER — Encounter (HOSPITAL_COMMUNITY): Payer: Self-pay

## 2016-08-03 ENCOUNTER — Inpatient Hospital Stay (HOSPITAL_COMMUNITY)
Admission: AD | Admit: 2016-08-03 | Discharge: 2016-08-03 | Disposition: A | Discharge status: Home / Self Care | Payer: BLUE CROSS/BLUE SHIELD | Source: Ambulatory Visit | Attending: Obstetrics & Gynecology | Admitting: Obstetrics & Gynecology

## 2016-08-03 DIAGNOSIS — K219 Gastro-esophageal reflux disease without esophagitis: Secondary | ICD-10-CM | POA: Insufficient documentation

## 2016-08-03 DIAGNOSIS — Z881 Allergy status to other antibiotic agents status: Secondary | ICD-10-CM | POA: Insufficient documentation

## 2016-08-03 DIAGNOSIS — Z3A09 9 weeks gestation of pregnancy: Secondary | ICD-10-CM | POA: Insufficient documentation

## 2016-08-03 DIAGNOSIS — Z87891 Personal history of nicotine dependence: Secondary | ICD-10-CM | POA: Diagnosis not present

## 2016-08-03 DIAGNOSIS — R1013 Epigastric pain: Secondary | ICD-10-CM | POA: Diagnosis present

## 2016-08-03 DIAGNOSIS — O99611 Diseases of the digestive system complicating pregnancy, first trimester: Secondary | ICD-10-CM | POA: Diagnosis not present

## 2016-08-03 DIAGNOSIS — O26891 Other specified pregnancy related conditions, first trimester: Secondary | ICD-10-CM | POA: Insufficient documentation

## 2016-08-03 DIAGNOSIS — Z8249 Family history of ischemic heart disease and other diseases of the circulatory system: Secondary | ICD-10-CM | POA: Diagnosis not present

## 2016-08-03 DIAGNOSIS — O219 Vomiting of pregnancy, unspecified: Secondary | ICD-10-CM

## 2016-08-03 LAB — URINALYSIS, ROUTINE W REFLEX MICROSCOPIC
Bilirubin Urine: NEGATIVE
Glucose, UA: NEGATIVE mg/dL
Hgb urine dipstick: NEGATIVE
KETONES UR: NEGATIVE mg/dL
NITRITE: NEGATIVE
PH: 6 (ref 5.0–8.0)
Protein, ur: 30 mg/dL — AB
SPECIFIC GRAVITY, URINE: 1.038 — AB (ref 1.005–1.030)

## 2016-08-03 MED ORDER — ONDANSETRON 8 MG PO TBDP
8.0000 mg | ORAL_TABLET | Freq: Once | ORAL | Status: AC
  Administered 2016-08-03: 8 mg via ORAL
  Filled 2016-08-03: qty 1

## 2016-08-03 MED ORDER — ONDANSETRON 8 MG PO TBDP: 8.0000 mg | ORAL_TABLET | Freq: Once | ORAL | Status: DC

## 2016-08-03 MED ORDER — OMEPRAZOLE 20 MG PO CPDR: 20.0000 mg | DELAYED_RELEASE_CAPSULE | Freq: Every day | ORAL | 11 refills | Status: DC

## 2016-08-03 MED ORDER — ONDANSETRON 4 MG PO TBDP: 4.0000 mg | ORAL_TABLET | Freq: Three times a day (TID) | ORAL | 0 refills | Status: DC | PRN

## 2016-08-03 MED ORDER — GI COCKTAIL ~~LOC~~
30.0000 mL | Freq: Once | ORAL | Status: AC
  Administered 2016-08-03: 30 mL via ORAL
  Filled 2016-08-03: qty 30

## 2016-08-03 NOTE — Discharge Instructions (Signed)
Heartburn Heartburn is a type of pain or discomfort that can happen in the throat or chest. It is often described as a burning pain. It may also cause a bad taste in the mouth. Heartburn may feel worse when you lie down or bend over. It may be caused by stomach contents that move back up (reflux) into the tube that connects the mouth with the stomach (esophagus). Follow these instructions at home: Take these actions to lessen your discomfort and to help avoid problems. Diet   Follow a diet as told by your doctor. You may need to avoid foods and drinks such as:  Coffee and tea (with or without caffeine).  Drinks that contain alcohol.  Energy drinks and sports drinks.  Carbonated drinks or sodas.  Chocolate and cocoa.  Peppermint and mint flavorings.  Garlic and onions.  Horseradish.  Spicy and acidic foods, such as peppers, chili powder, curry powder, vinegar, hot sauces, and BBQ sauce.  Citrus fruit juices and citrus fruits, such as oranges, lemons, and limes.  Tomato-based foods, such as red sauce, chili, salsa, and pizza with red sauce.  Fried and fatty foods, such as donuts, french fries, potato chips, and high-fat dressings.  High-fat meats, such as hot dogs, rib eye steak, sausage, ham, and bacon.  High-fat dairy items, such as whole milk, butter, and cream cheese.  Eat small meals often. Avoid eating large meals.  Avoid drinking large amounts of liquid with your meals.  Avoid eating meals during the 2-3 hours before bedtime.  Avoid lying down right after you eat.  Do not exercise right after you eat. General instructions   Pay attention to any changes in your symptoms.  Take over-the-counter and prescription medicines only as told by your doctor. Do not take aspirin, ibuprofen, or other NSAIDs unless your doctor says it is okay.  Do not use any tobacco products, including cigarettes, chewing tobacco, and e-cigarettes. If you need help quitting, ask your  doctor.  Wear loose clothes. Do not wear anything tight around your waist.  Raise (elevate) the head of your bed about 6 inches (15 cm).  Try to lower your stress. If you need help doing this, ask your doctor.  If you are overweight, lose an amount of weight that is healthy for you. Ask your doctor about a safe weight loss goal.  Keep all follow-up visits as told by your doctor. This is important. Contact a doctor if:  You have new symptoms.  You lose weight and you do not know why it is happening.  You have trouble swallowing, or it hurts to swallow.  You have wheezing or a cough that keeps happening.  Your symptoms do not get better with treatment.  You have heartburn often for more than two weeks. Get help right away if:  You have pain in your arms, neck, jaw, teeth, or back.  You feel sweaty, dizzy, or light-headed.  You have chest pain or shortness of breath.  You throw up (vomit) and your throw up looks like blood or coffee grounds.  Your poop (stool) is bloody or black. This information is not intended to replace advice given to you by your health care provider. Make sure you discuss any questions you have with your health care provider. Document Released: 12/26/2010 Document Revised: 09/21/2015 Document Reviewed: 08/10/2014 Elsevier Interactive Patient Education  2017 [employer] Hyperemesis Gravidarum Hyperemesis gravidarum is a severe form of nausea and vomiting that happens during pregnancy. Hyperemesis is worse than morning sickness.  It may cause you to have nausea or vomiting all day for many days. It may keep you from eating and drinking enough food and liquids. Hyperemesis usually occurs during the first half (the first 20 weeks) of pregnancy. It often goes away once a woman is in her second half of pregnancy. However, sometimes hyperemesis continues through an entire pregnancy. What are the causes? The cause of this condition is not known. It may be related  to changes in chemicals (hormones) in the body during pregnancy, such as the high level of pregnancy hormone (human chorionic gonadotropin) or the increase in the female sex hormone (estrogen). What are the signs or symptoms? Symptoms of this condition include:  Severe nausea and vomiting.  Nausea that does not go away.  Vomiting that does not allow you to keep any food down.  Weight loss.  Body fluid loss (dehydration).  Having no desire to eat, or not liking food that you have previously enjoyed. How is this diagnosed? This condition may be diagnosed based on:  A physical exam.  Your medical history.  Your symptoms.  Blood tests.  Urine tests. How is this treated? This condition may be managed with medicine. If medicines to do not help relieve nausea and vomiting, you may need to receive fluids through an IV tube at the hospital. Follow these instructions at home:  Take over-the-counter and prescription medicines only as told by your health care provider.  Avoid iron pills and multivitamins that contain iron for the first 3-4 months of pregnancy. If you take prescription iron pills, do not stop taking them unless your health care provider approves.  Take the following actions to help prevent nausea and vomiting:  In the morning, before getting out of bed, try eating a couple of dry crackers or a piece of toast.  Avoid foods and smells that upset your stomach. Fatty and spicy foods may make nausea worse.  Eat 5-6 small meals a day.  Do not drink fluids while eating meals. Drink between meals.  Eat or suck on things that have ginger in them. Ginger can help relieve nausea.  Avoid food preparation. The smell of food can spoil your appetite or trigger nausea.  Follow instructions from your health care provider about eating or drinking restrictions.  For snacks, eat high-protein foods, such as cheese.  Keep all follow-up and pre-birth (prenatal) visits as told by  your health care provider. This is important. Contact a health care provider if:  You have pain in your abdomen.  You have a severe headache.  You have vision problems.  You are losing weight. Get help right away if:  You cannot drink fluids without vomiting.  You vomit blood.  You have constant nausea and vomiting.  You are very weak.  You are very thirsty.  You feel dizzy.  You faint.  You have a fever or other symptoms that last for more than 2-3 days.  You have a fever and your symptoms suddenly get worse. Summary  Hyperemesis gravidarum is a severe form of nausea and vomiting that happens during pregnancy.  Making some changes to your eating habits may help relieve nausea and vomiting.  This condition may be managed with medicine.  If medicines to do not help relieve nausea and vomiting, you may need to receive fluids through an IV tube at the hospital. This information is not intended to replace advice given to you by your health care provider. Make sure you discuss any questions you have  with your health care provider. Document Released: 04/15/2005 Document Revised: 12/13/2015 Document Reviewed: 12/13/2015 Elsevier Interactive Patient Education  2017 [employer].

## 2016-08-03 NOTE — MAU Provider Note (Signed)
History   [id number] @ 9.[redacted] wks gestation in with c/o epigastric pain nausea and vomiting. States vomited 9 times today. Pain started today.  CSN: [id number]  Arrival date & time 08/03/16  1935   None     No chief complaint on file.   HPI  Past Medical History:  Diagnosis Date  . Attention and concentration deficit   . Bipolar 1 disorder ([place])   . Ectopic pregnancy   . Exposure to STD   . GERD (gastroesophageal reflux disease)   . Headache    Migraines  . 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 Ovarian cyst   . Pneumonia   . Sleep apnea    childhood, adenoidectomy  . Vaginal Pap smear, abnormal 2014    Past Surgical History:  Procedure Laterality Date  . DILATION AND CURETTAGE OF UTERUS    . LAPAROSCOPIC UNILATERAL SALPINGECTOMY Left 11/21/2015   Procedure: LAPAROSCOPIC UNILATERAL SALPINGECTOMY;  Surgeon: Woodroe Mode, MD;  Location: [facility];  Service: Gynecology;  Laterality: Left;  . LAPAROSCOPY N/A 11/21/2015   Procedure: LAPAROSCOPY DIAGNOSTIC;  Surgeon: Woodroe Mode, MD;  Location: [facility];  Service: Gynecology;  Laterality: N/A;  . NASAL SINUS SURGERY    . TONSILECTOMY, ADENOIDECTOMY, BILATERAL MYRINGOTOMY AND TUBES    . TONSILLECTOMY    . TUBAL LIGATION      Family History  Problem Relation Age of Onset  . Heart disease Father   . Cancer Maternal Grandmother     breast  . Parkinsonism Maternal Grandfather   . Cancer Paternal Aunt     breast     Social History  Substance Use Topics  . Smoking status: Former Smoker    Years: 3.00    Quit date: 10/23/2011  . Smokeless tobacco: Never Used  . Alcohol use 6.6 oz/week    8 Glasses of wine, 3 Standard drinks or equivalent per week     Comment: occ    OB History    Gravida Para Term Preterm AB Living   5       4     SAB TAB Ectopic Multiple Live Births   2   2          Review of Systems  Constitutional: Negative.   HENT: Negative.    Eyes: Negative.   Respiratory: Negative.   Cardiovascular: Negative.   Gastrointestinal: Positive for abdominal pain, nausea and vomiting.       Epigastric pain.  Endocrine: Negative.   Genitourinary: Negative.   Musculoskeletal: Negative.   Skin: Negative.     Allergies  Ceclor [cefaclor]  Home Medications    Wt 230 lb 12 oz (104.7 kg)   LMP 05/27/2016 (Exact Date)   BMI 36.14 kg/m   Physical Exam  Constitutional: She is oriented to person, place, and time. She appears well-developed and well-nourished.  HENT:  Head: Normocephalic.  Eyes: Pupils are equal, round, and reactive to light.  Neck: Normal range of motion.  Cardiovascular: Normal rate, regular rhythm, normal heart sounds and intact distal pulses.   Pulmonary/Chest: Effort normal and breath sounds normal.  Abdominal: Soft. Bowel sounds are normal.  Musculoskeletal: Normal range of motion.  Neurological: She is alert and oriented to person, place, and time. She has normal reflexes.  Skin: Skin is warm and dry.  Psychiatric: She has a normal mood and affect. Her behavior is normal. Judgment and thought content normal.  MAU Course  Procedures (including critical care time)  Labs Reviewed  URINALYSIS, ROUTINE W REFLEX MICROSCOPIC   No results found.   1. Nausea and vomiting during pregnancy prior to [redacted] weeks gestation   2. Abdominal pain, epigastric       MDM  Gerd  Urine normal, VSS, Exam WNL. Feeling better after GI cocktail. Will d/c home with Rx for prilosec and zofran

## 2016-08-03 NOTE — MAU Note (Signed)
Is about 6 weeks but not having morning sickness but 3 am today having upper abd pain and vomited and watery diarrhea 9 times today.  Felt extremely weak.  No bleeding.

## 2016-08-03 NOTE — Progress Notes (Signed)
Pt was here on 07/25/2016 and was told due date is 03/03/2017 according to LMP but has irregular periods.  Had her first appointment with Dr Corinna Capra and has ultrasound picture with her and Due Date according to office u/s is 03/31/2017 which makes her 5weeks and 5 days today.

## 2016-09-25 ENCOUNTER — Encounter: Payer: Self-pay | Admitting: Physician Assistant

## 2016-10-03 ENCOUNTER — Encounter (HOSPITAL_COMMUNITY): Payer: Self-pay | Admitting: Emergency Medicine

## 2016-10-03 ENCOUNTER — Ambulatory Visit (HOSPITAL_COMMUNITY)
Admission: EM | Admit: 2016-10-03 | Discharge: 2016-10-03 | Disposition: A | Discharge status: Home / Self Care | Payer: BLUE CROSS/BLUE SHIELD | Attending: Internal Medicine | Admitting: Internal Medicine

## 2016-10-03 DIAGNOSIS — H9221 Otorrhagia, right ear: Secondary | ICD-10-CM

## 2016-10-03 DIAGNOSIS — H9203 Otalgia, bilateral: Secondary | ICD-10-CM | POA: Diagnosis not present

## 2016-10-03 NOTE — ED Triage Notes (Signed)
Pt c/o bilateral ear pain associated w/blood drainage from right ear  Denies inj/trauma, fevers, chills  Pt is currently [redacted] weeks pregnant.   A&O x4... NAD... Ambulatory

## 2016-10-03 NOTE — Discharge Instructions (Signed)
Do not see any signs of infection at this time. Not certain as to what is causing all of the pain. No drainage is seen from the middle ear. The bleeding is stopped right now and is not coming from the middle ear but a small scrape on the ear canal. This is not serious. Tylenol for pain and heat.

## 2016-10-03 NOTE — ED Provider Notes (Signed)
CSN: [id number]     Arrival date & time 10/03/16  1654 History   First MD Initiated Contact with Patient 10/03/16 1745     Chief Complaint  Patient presents with  . Otalgia   (Consider location/radiation/quality/duration/timing/severity/associated sxs/prior Treatment) 26 year old female who has a history of multiple surgeries and infections to both ears presents today with bilateral ear pain right greater than left. She did notice some bleeding from the right ear and she was told by her supervisor that she had to the seen today. She is taking Tylenol for pain and using heat. She is unable to take NSAIDs secondary to pregnancy. Denies a change in hearing. She did use a Q-tip in her right ear earlier today.      Past Medical History:  Diagnosis Date  . Attention and concentration deficit   . Bipolar 1 disorder ([place])   . Ectopic pregnancy   . Exposure to STD   . GERD (gastroesophageal reflux disease)   . Headache    Migraines  . 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 Ovarian cyst   . Pneumonia   . Sleep apnea    childhood, adenoidectomy  . Vaginal Pap smear, abnormal 2014   Past Surgical History:  Procedure Laterality Date  . DILATION AND CURETTAGE OF UTERUS    . LAPAROSCOPIC UNILATERAL SALPINGECTOMY Left 11/21/2015   Procedure: LAPAROSCOPIC UNILATERAL SALPINGECTOMY;  Surgeon: Woodroe Mode, MD;  Location: [facility];  Service: Gynecology;  Laterality: Left;  . LAPAROSCOPY N/A 11/21/2015   Procedure: LAPAROSCOPY DIAGNOSTIC;  Surgeon: Woodroe Mode, MD;  Location: [facility];  Service: Gynecology;  Laterality: N/A;  . NASAL SINUS SURGERY    . TONSILECTOMY, ADENOIDECTOMY, BILATERAL MYRINGOTOMY AND TUBES    . TONSILLECTOMY    . TUBAL LIGATION     Family History  Problem Relation Age of Onset  . Heart disease Father   . Cancer Maternal Grandmother        breast  . Parkinsonism Maternal Grandfather   . Cancer Paternal  Aunt        breast    Social History  Substance Use Topics  . Smoking status: Former Smoker    Years: 3.00    Quit date: 10/23/2011  . Smokeless tobacco: Never Used  . Alcohol use No     Comment: not with preg   OB History    Gravida Para Term Preterm AB Living   5       4     SAB TAB Ectopic Multiple Live Births   2   2         Review of Systems  Constitutional: Negative.  Negative for fever.  HENT: Positive for ear discharge and ear pain.   Eyes: Negative.   Gastrointestinal: Negative.   All other systems reviewed and are negative.   Allergies  Ceclor [cefaclor]  Home Medications   Prior to Admission medications   Medication Sig Start Date End Date Taking? Authorizing Provider  albuterol (PROVENTIL HFA;VENTOLIN HFA) 108 (90 Base) MCG/ACT inhaler Inhale 2 puffs into the lungs every 6 (six) hours as needed for wheezing or shortness of breath. 06/02/16  Yes Lacretia Leigh, MD  ergocalciferol (VITAMIN D2) 50000 units capsule Take 50,000 Units by mouth once a week.   Yes [provider]  fluticasone (FLONASE) 50 MCG/ACT nasal spray Place 2 sprays into both nostrils daily. 06/26/16  Yes Marshell Garfinkel, MD  omeprazole (PRILOSEC) 20 MG capsule Take 1 capsule (20 mg total) by mouth daily. 08/03/16  Yes Keitha Butte, CNM  Prenatal Vit-Fe Fumarate-FA (PRENATAL MULTIVITAMIN) TABS tablet Take 1 tablet by mouth daily at 12 noon.   Yes [provider]  FLUoxetine (PROZAC) 20 MG tablet Take 20 mg by mouth daily.    [provider]   Meds Ordered and Administered this Visit  Medications - No data to display  BP 113/66 (BP Location: Right Arm)   Pulse 87   Temp 98.2 F (36.8 C) (Oral)   Resp 20   LMP 05/27/2016 (Exact Date)   SpO2 96%  No data found.   Physical Exam  Constitutional: She is oriented to person, place, and time. She appears well-developed and well-nourished.  HENT:  Head: Normocephalic and atraumatic.  Mouth/Throat: Oropharynx is clear  and moist.  Left TM myringotomy tube is in place. No drainage. No erythema. There is pallor with a slight bulge in the superior most aspect.  Right TM without tube as the patient admits that it fell out several weeks ago. The administration from the tube remains. There is no drainage. The right TM is distorted from previous surgeries and discolored. Minimal erythema over the umbo only. No drainage, no fluid. The EAC is clear with exception of 1 small area of bleeding approximately 1 cm into the ear. This area has clotted off and bleeding has stopped. No other drainage from the middle ear or canal at this time.  Eyes: EOM are normal.  Neck: Normal range of motion. Neck supple.  Pulmonary/Chest: Effort normal.  Neurological: She is alert and oriented to person, place, and time.  Skin: Skin is warm and dry.  Nursing note and vitals reviewed.   Urgent Care Course     Procedures (including critical care time)  Labs Review Labs Reviewed - No data to display  Imaging Review No results found.   Visual Acuity Review  Right Eye Distance:   Left Eye Distance:   Bilateral Distance:    Right Eye Near:   Left Eye Near:    Bilateral Near:         MDM   1. Otalgia of both ears   2. Bleeding from right ear    Do not see any signs of infection at this time. Not certain as to what is causing all of the pain. No drainage is seen from the middle ear. The bleeding is stopped right now and is not coming from the middle ear but a small scrape on the ear canal. This is not serious. Tylenol for pain and heat.     Janne Napoleon, NP 10/03/16 Vernelle Emerald

## 2016-10-20 ENCOUNTER — Encounter (HOSPITAL_COMMUNITY): Payer: Self-pay

## 2016-10-20 ENCOUNTER — Emergency Department (HOSPITAL_COMMUNITY)
Admission: EM | Admit: 2016-10-20 | Discharge: 2016-10-20 | Disposition: A | Discharge status: Home / Self Care | Payer: BLUE CROSS/BLUE SHIELD | Attending: Emergency Medicine | Admitting: Emergency Medicine

## 2016-10-20 DIAGNOSIS — J069 Acute upper respiratory infection, unspecified: Secondary | ICD-10-CM | POA: Diagnosis not present

## 2016-10-20 DIAGNOSIS — Z87891 Personal history of nicotine dependence: Secondary | ICD-10-CM | POA: Diagnosis not present

## 2016-10-20 DIAGNOSIS — Z79899 Other long term (current) drug therapy: Secondary | ICD-10-CM | POA: Insufficient documentation

## 2016-10-20 DIAGNOSIS — R05 Cough: Secondary | ICD-10-CM | POA: Diagnosis present

## 2016-10-20 LAB — URINALYSIS, ROUTINE W REFLEX MICROSCOPIC
Bilirubin Urine: NEGATIVE
Glucose, UA: NEGATIVE mg/dL
Hgb urine dipstick: NEGATIVE
Ketones, ur: 40 mg/dL — AB
Nitrite: NEGATIVE
PROTEIN: NEGATIVE mg/dL
SPECIFIC GRAVITY, URINE: 1.025 (ref 1.005–1.030)
pH: 6 (ref 5.0–8.0)

## 2016-10-20 LAB — RAPID STREP SCREEN (MED CTR MEBANE ONLY): STREPTOCOCCUS, GROUP A SCREEN (DIRECT): NEGATIVE

## 2016-10-20 LAB — I-STAT CHEM 8, ED
BUN: <3 mg/dL — ABNORMAL LOW (ref 6–20)
CALCIUM ION: 1.15 mmol/L (ref 1.15–1.40)
CREATININE: 0.5 mg/dL (ref 0.44–1.00)
Chloride: 106 mmol/L (ref 101–111)
Glucose, Bld: 80 mg/dL (ref 65–99)
HCT: 35 % — ABNORMAL LOW (ref 36.0–46.0)
HEMOGLOBIN: 11.9 g/dL — AB (ref 12.0–15.0)
Potassium: 3.7 mmol/L (ref 3.5–5.1)
Sodium: 139 mmol/L (ref 135–145)
TCO2: 20 mmol/L (ref 0–100)

## 2016-10-20 LAB — URINALYSIS, MICROSCOPIC (REFLEX)

## 2016-10-20 MED ORDER — SODIUM CHLORIDE 0.9 % IV SOLN: INTRAVENOUS | Status: DC

## 2016-10-20 MED ORDER — SODIUM CHLORIDE 0.9 % IV BOLUS (SEPSIS)
2000.0000 mL | Freq: Once | INTRAVENOUS | Status: AC
  Administered 2016-10-20: 2000 mL via INTRAVENOUS

## 2016-10-20 NOTE — ED Notes (Signed)
Pt ambulated to restroom without difficulty

## 2016-10-20 NOTE — ED Provider Notes (Signed)
[facility] DEPT Provider Note   CSN: [id number] Arrival date & time: 10/20/16  1746     History   Chief Complaint Chief Complaint  Patient presents with  . Cough  . Otalgia  . Abdominal Pain    [redacted] weeks pregnant    HPI Danielle Evans is a 26 y.o. female.  26 year old female presents with four-day history of fever or cough or congestion. Denies any vomiting or diarrhea. Has had nasal congestion as well as a sore throat. No rashes appreciated. No neck pain photophobia. She is currently [redacted] weeks pregnant and has had some suprapubic tenderness but denies any vaginal bleeding or discharge. Denies any dysuria but has had some frequency. No flank pain noted. Has been using over-the-counter medications without relief.      Past Medical History:  Diagnosis Date  . Attention and concentration deficit   . Bipolar 1 disorder ([place])   . Ectopic pregnancy   . Exposure to STD   . GERD (gastroesophageal reflux disease)   . Headache    Migraines  . 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 Ovarian cyst   . Pneumonia   . Sleep apnea    childhood, adenoidectomy  . Vaginal Pap smear, abnormal 2014    Patient Active Problem List   Diagnosis Date Noted  . Pelvic pain in female 09/12/2011  . Ovarian cyst 09/12/2011  . Attention and concentration deficit   . Bipolar 1 disorder [facility])     Past Surgical History:  Procedure Laterality Date  . DILATION AND CURETTAGE OF UTERUS    . LAPAROSCOPIC UNILATERAL SALPINGECTOMY Left 11/21/2015   Procedure: LAPAROSCOPIC UNILATERAL SALPINGECTOMY;  Surgeon: Woodroe Mode, MD;  Location: [facility];  Service: Gynecology;  Laterality: Left;  . LAPAROSCOPY N/A 11/21/2015   Procedure: LAPAROSCOPY DIAGNOSTIC;  Surgeon: Woodroe Mode, MD;  Location: [facility];  Service: Gynecology;  Laterality: N/A;  . NASAL SINUS SURGERY    . TONSILECTOMY, ADENOIDECTOMY, BILATERAL MYRINGOTOMY AND TUBES    .  TONSILLECTOMY    . TUBAL LIGATION      OB History    Gravida Para Term Preterm AB Living   5       4     SAB TAB Ectopic Multiple Live Births   2   2           Home Medications    Prior to Admission medications   Medication Sig Start Date End Date Taking? Authorizing Provider  acetaminophen (TYLENOL) 500 MG tablet Take 500 mg by mouth every 6 (six) hours as needed.   Yes [provider]  omeprazole (PRILOSEC) 20 MG capsule Take 1 capsule (20 mg total) by mouth daily. 08/03/16  Yes Keitha Butte, CNM  phenylephrine (SUDAFED PE) 10 MG TABS tablet Take 10 mg by mouth every 4 (four) hours as needed.   Yes [provider]  Prenatal Vit-Fe Fumarate-FA (PRENATAL MULTIVITAMIN) TABS tablet Take 1 tablet by mouth daily at 12 noon.   Yes [provider]  pseudoephedrine (SUDAFED) 120 MG 12 hr tablet Take 120 mg by mouth 2 (two) times daily.   Yes [provider]  albuterol (PROVENTIL HFA;VENTOLIN HFA) 108 (90 Base) MCG/ACT inhaler Inhale 2 puffs into the lungs every 6 (six) hours as needed for wheezing or shortness of breath. Patient not taking: Reported on 10/20/2016 06/02/16   Lacretia Leigh, MD  fluticasone [facility]) 50 MCG/ACT nasal spray  Place 2 sprays into both nostrils daily. Patient not taking: Reported on 10/20/2016 06/26/16   Marshell Garfinkel, MD    Family History Family History  Problem Relation Age of Onset  . Heart disease Father   . Cancer Maternal Grandmother        breast  . Parkinsonism Maternal Grandfather   . Cancer Paternal Aunt        breast     Social History Social History  Substance Use Topics  . Smoking status: Former Smoker    Years: 3.00    Quit date: 10/23/2011  . Smokeless tobacco: Never Used  . Alcohol use No     Comment: not with preg     Allergies   Ceclor [cefaclor]   Review of Systems Review of Systems  All other systems reviewed and are negative.    Physical Exam Updated Vital Signs BP 127/87 (BP  Location: Left Arm)   Pulse 88   Temp 98 F (36.7 C) (Oral)   Resp 18   Ht 1.702 m (5\' 7" )   Wt 103.4 kg (228 lb)   LMP 05/27/2016 (Exact Date)   SpO2 98%   BMI 35.71 kg/m   Physical Exam  Constitutional: She is oriented to person, place, and time. She appears well-developed and well-nourished.  Non-toxic appearance. No distress.  HENT:  Head: Normocephalic and atraumatic.  Eyes: Conjunctivae, EOM and lids are normal. Pupils are equal, round, and reactive to light.  Neck: Normal range of motion. Neck supple. No tracheal deviation present. No thyroid mass present.  Cardiovascular: Normal rate, regular rhythm and normal heart sounds.  Exam reveals no gallop.   No murmur heard. Pulmonary/Chest: Effort normal and breath sounds normal. No stridor. No respiratory distress. She has no decreased breath sounds. She has no wheezes. She has no rhonchi. She has no rales.  Abdominal: Soft. Normal appearance and bowel sounds are normal. She exhibits no distension. There is no tenderness. There is no rigidity, no rebound, no guarding and no CVA tenderness.  Musculoskeletal: Normal range of motion. She exhibits no edema or tenderness.  Neurological: She is alert and oriented to person, place, and time. She has normal strength. No cranial nerve deficit or sensory deficit. GCS eye subscore is 4. GCS verbal subscore is 5. GCS motor subscore is 6.  Skin: Skin is warm and dry. No abrasion and no rash noted.  Psychiatric: She has a normal mood and affect. Her speech is normal and behavior is normal.  Nursing note and vitals reviewed.    ED Treatments / Results  Labs (all labs ordered are listed, but only abnormal results are displayed) Labs Reviewed  RAPID STREP SCREEN (NOT AT [facility])  URINE CULTURE  URINALYSIS, ROUTINE W REFLEX MICROSCOPIC  I-STAT CHEM 8, ED    EKG  EKG Interpretation None       Radiology No results found.  Procedures Procedures (including critical care  time)  Medications Ordered in ED Medications  0.9 %  sodium chloride infusion (not administered)  sodium chloride 0.9 % bolus 2,000 mL (not administered)     Initial Impression / Assessment and Plan / ED Course  I have reviewed the triage vital signs and the nursing notes.  Pertinent labs & imaging results that were available during my care of the patient were reviewed by me and considered in my medical decision making (see chart for details).     Patient given IV fluids here and feels better. Strep is negative. Suspect that she has  a URI. Fetal heart tones were in the 140s. Will follow-up with her Dr. this week  Final Clinical Impressions(s) / ED Diagnoses   Final diagnoses:  None    New Prescriptions New Prescriptions   No medications on file     Lacretia Leigh, MD 10/20/16 2110

## 2016-10-20 NOTE — ED Triage Notes (Signed)
Pt c/o sore throat, fever, cough, otalgia, and congestion x4 days. She has been monitoring at home with sudafed and tylenol. She states that today she started experiencing lower abd pain. Pt is [redacted] weeks pregnant. No spotting.  A&Ox4. Ambulatory.

## 2016-10-20 NOTE — ED Notes (Signed)
Pt aware urine sample is needed and a urine cup has been provided

## 2016-10-22 LAB — URINE CULTURE

## 2016-10-23 LAB — CULTURE, GROUP A STREP (THRC)

## 2016-11-21 ENCOUNTER — Inpatient Hospital Stay (HOSPITAL_COMMUNITY)
Admission: AD | Admit: 2016-11-21 | Discharge: 2016-11-21 | Disposition: A | Discharge status: Home / Self Care | Payer: BLUE CROSS/BLUE SHIELD | Source: Ambulatory Visit | Attending: Obstetrics and Gynecology | Admitting: Obstetrics and Gynecology

## 2016-11-21 ENCOUNTER — Encounter (HOSPITAL_COMMUNITY): Payer: Self-pay

## 2016-11-21 DIAGNOSIS — Z3A21 21 weeks gestation of pregnancy: Secondary | ICD-10-CM | POA: Diagnosis not present

## 2016-11-21 DIAGNOSIS — N898 Other specified noninflammatory disorders of vagina: Secondary | ICD-10-CM | POA: Diagnosis not present

## 2016-11-21 DIAGNOSIS — Z8249 Family history of ischemic heart disease and other diseases of the circulatory system: Secondary | ICD-10-CM | POA: Diagnosis not present

## 2016-11-21 DIAGNOSIS — Z87891 Personal history of nicotine dependence: Secondary | ICD-10-CM | POA: Diagnosis not present

## 2016-11-21 DIAGNOSIS — O26892 Other specified pregnancy related conditions, second trimester: Secondary | ICD-10-CM | POA: Insufficient documentation

## 2016-11-21 DIAGNOSIS — Z881 Allergy status to other antibiotic agents status: Secondary | ICD-10-CM | POA: Diagnosis not present

## 2016-11-21 DIAGNOSIS — R05 Cough: Secondary | ICD-10-CM | POA: Insufficient documentation

## 2016-11-21 LAB — WET PREP, GENITAL
Clue Cells Wet Prep HPF POC: NONE SEEN
SPERM: NONE SEEN
TRICH WET PREP: NONE SEEN
YEAST WET PREP: NONE SEEN

## 2016-11-21 LAB — AMNISURE RUPTURE OF MEMBRANE (ROM) NOT AT ARMC: Amnisure ROM: NEGATIVE

## 2016-11-21 NOTE — MAU Provider Note (Signed)
Chief Complaint:  Rupture of Membranes  HPI: Danielle Evans is a 26 y.o. [id number] at [redacted]w[redacted]d who presents to MAU reporting leaking of fluid. Patient states that she has noticed increased wetness in her patienes for about a month but thought it was due to increased vaginal discharge. However, at recent US at 19wks was asked if she had been leaking. Since then she has been more aware. Today she noticed that her panties were soaked. She is unsure if this was due to sweating or not. Noticed some associated odor. Denies it being urine. Has occasional abdominal cramping. Denies contractions or vaginal bleeding. Good fetal movement.   Patient has also been endorsing increased cough and states that she has some increased fluid when coughing. Denies fevers.   OB history significant for 4 prior pregnancies with no living children.   Pregnancy Course:  [facility]  Past Medical History: Past Medical History:  Diagnosis Date  . Attention and concentration deficit   . Bipolar 1 disorder ([place])   . Ectopic pregnancy   . Exposure to STD   . GERD (gastroesophageal reflux disease)   . Headache    Migraines  . 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 Ovarian cyst   . Pneumonia   . Sleep apnea    childhood, adenoidectomy  . Vaginal Pap smear, abnormal 2014    Past obstetric history: OB History  Gravida Para Term Preterm AB Living  5       4    SAB TAB Ectopic Multiple Live Births  2   2        # Outcome Date GA Lbr Len/2nd Weight Sex Delivery Anes PTL Lv  5 Current           4 Ectopic 04/2015 [redacted]w[redacted]d         3 SAB 02/2015 [redacted]w[redacted]d         2 Ectopic 2015 [redacted]w[redacted]d         1 SAB 2011 [redacted]w[redacted]d             Past Surgical History: Past Surgical History:  Procedure Laterality Date  . DILATION AND CURETTAGE OF UTERUS    . LAPAROSCOPIC UNILATERAL SALPINGECTOMY Left 11/21/2015   Procedure: LAPAROSCOPIC UNILATERAL  SALPINGECTOMY;  Surgeon: Woodroe Mode, MD;  Location: [facility];  Service: Gynecology;  Laterality: Left;  . LAPAROSCOPY N/A 11/21/2015   Procedure: LAPAROSCOPY DIAGNOSTIC;  Surgeon: Woodroe Mode, MD;  Location: [facility];  Service: Gynecology;  Laterality: N/A;  . NASAL SINUS SURGERY    . TONSILECTOMY, ADENOIDECTOMY, BILATERAL MYRINGOTOMY AND TUBES    . TONSILLECTOMY    . TUBAL LIGATION       Family History: Family History  Problem Relation Age of Onset  . Heart disease Father   . Cancer Maternal Grandmother        breast  . Parkinsonism Maternal Grandfather   . Cancer Paternal Aunt        breast     Social History: Social History  Substance Use Topics  . Smoking status: Former Smoker    Years: 3.00    Quit date: 10/23/2011  . Smokeless tobacco: Never Used  . Alcohol use No     Comment: not with preg    Allergies:  Allergies  Allergen Reactions  . Ceclor [Cefaclor] Hives    Meds:  Prescriptions Prior to Admission  Medication Sig  Dispense Refill Last Dose  . acetaminophen (TYLENOL) 500 MG tablet Take 500 mg by mouth every 6 (six) hours as needed.   10/20/2016 at 1030  . albuterol (PROVENTIL HFA;VENTOLIN HFA) 108 (90 Base) MCG/ACT inhaler Inhale 2 puffs into the lungs every 6 (six) hours as needed for wheezing or shortness of breath. (Patient not taking: Reported on 10/20/2016) 1 Inhaler 2 Not Taking at Unknown time  . fluticasone (FLONASE) 50 MCG/ACT nasal spray Place 2 sprays into both nostrils daily. (Patient not taking: Reported on 10/20/2016) 16 g 2 Not Taking at Unknown time  . omeprazole (PRILOSEC) 20 MG capsule Take 1 capsule (20 mg total) by mouth daily. 30 capsule 11 10/19/2016 at Unknown time  . phenylephrine (SUDAFED PE) 10 MG TABS tablet Take 10 mg by mouth every 4 (four) hours as needed.   10/19/2016 at Unknown time  . Prenatal Vit-Fe Fumarate-FA (PRENATAL MULTIVITAMIN) TABS tablet Take 1 tablet by mouth daily at 12 noon.   10/19/2016 at Unknown time  .  pseudoephedrine (SUDAFED) 120 MG 12 hr tablet Take 120 mg by mouth 2 (two) times daily.   10/20/2016 at 1    I have reviewed patient's Past Medical Hx, Surgical Hx, Family Hx, Social Hx, medications and allergies.   ROS:  All systems reviewed and are negative for acute change except as noted in the HPI.   Physical Exam   Patient Vitals for the past 24 hrs:  BP Temp Pulse Resp  11/21/16 1841 (!) 116/55 98.5 F (36.9 C) 83 18   Constitutional: Well-developed, well-nourished female in no acute distress.  Cardiovascular: normal rate Respiratory: normal effort GI: Abd soft, non-tender, gravid appropriate for gestational age.  MS: Extremities nontender, no edema, normal ROM Neurologic: Alert and oriented x 4. No focal deficits  Sterile speculum exam: NEFG, physiologic discharge, no blood, cervix closed. No pooling.  Labs: Results for orders placed or performed during the hospital encounter of 11/21/16 (from the past 24 hour(s))  Wet prep, genital     Status: Abnormal   Collection Time: 11/21/16  6:19 PM  Result Value Ref Range   Yeast Wet Prep HPF POC NONE SEEN NONE SEEN   Trich, Wet Prep NONE SEEN NONE SEEN   Clue Cells Wet Prep HPF POC NONE SEEN NONE SEEN   WBC, Wet Prep HPF POC MODERATE (A) NONE SEEN   Sperm NONE SEEN   Amnisure rupture of membrane (rom)not at [facility]     Status: None   Collection Time: 11/21/16  6:38 PM  Result Value Ref Range   Amnisure ROM NEGATIVE     Imaging:  No results found.  MAU Course: Doppler FHR 145 Gc/Ch cultures pending Wet prep negative Amnisure negative Fern negative  MDM: Plan of care reviewed with patient, including labs and tests ordered and medical treatment.   Assessment: 1. Vaginal discharge during pregnancy in second trimester   R/o PRROM  Plan: Discharge home in stable condition Discussed with Dr. Corinna Capra Preterm labor precautions and fetal kick counts discussed Reassurance given Cultures pending Handout  given Follow-up with OB provider   Luiz Blare, [place] for [facility], [facility] 11/21/2016 5:57 PM

## 2016-11-21 NOTE — MAU Note (Signed)
Pt has felt like she's leaking fluid and at 1400 today she had a lot of fluid and her underwear were wet at work. Came in after work. Good fetal movement. Pt had headache pain 6/10 with nausea. No bleeding.

## 2016-11-21 NOTE — Discharge Instructions (Signed)
Premature Rupture and Preterm Premature Rupture of Membranes °A sac made up of membranes surrounds your baby in the womb (uterus). Rupture of membranes is when this sac breaks open. This is also known as your "water breaking." When this sac breaks before labor starts, it is called premature rupture of membranes (PROM). If this happens before 37 weeks of being pregnant, it is called preterm premature rupture of membranes (PPROM). PPROM is serious. It needs medical care right away. °What increases the risk of PPROM? °PPROM is more likely to happen in women who: °· Have an infection. °· Have had PPROM before. °· Have a cervix that is short. °· Have bleeding during the second or third trimester. °· Have a low BMI. This is a measure of body fat. °· Smoke. °· Use drugs. °· Have a low socioeconomic status. ° °What problems can be caused by PROM and PPROM? °This condition creates health dangers for the mother and the baby. These include: °· Giving birth to the baby too early (prematurely). °· Getting a serious infection of the placenta (chorioamnionitis). °· Having the placenta detach from the uterus early (placental abruption). °· Squeezing of the umbilical cord. °· Getting a serious infection after delivery. ° °What are the signs of PROM and PPROM? °· A sudden gush of fluid from the vagina. °· A slow leak of fluid from the vagina. °· Your underwear is wet. °What should I do if I think my water broke? °Call your doctor right away. You will need to go to the hospital to get checked right away. °What happens if I am told that I have PROM or PPROM? °You will have tests done at the hospital. °· If you have PROM, you may be given medicine to start labor (be induced). This may be done if you are not having contractions during the 24 hours after your water broke. °· If you have PPROM and are not having contractions, you may be given medicine to start labor. It will depend on how far along you are in your pregnancy. ° °If you have  PPROM: °· You and your baby will be watched closely to see if you have infections or other problems. °· You may be given: °? An antibiotic medicine. This can stop an infection from starting. °? A steroid medicine. This can help your baby's lungs develop faster. °? A medicine to help prevent cerebral palsy in your baby. °? A medicine to stop early labor (preterm labor). °· You may be told to stay in bed except to use the bathroom (bed rest). °· You may be given medicine to start labor. This may be done if there are problems with you or the baby. ° °Your treatment will depend on many factors. °Contact a doctor if: °· Your water breaks and you are not having contractions. °Get help right away if: °· Your water breaks before you are [redacted] weeks pregnant. °Summary °· When your water breaks before labor starts, it is called premature rupture of membranes (PROM). °· When PROM happens before 37 weeks of pregnancy, it is called preterm premature rupture of membranes (PPROM). °· If you are not having contractions, your labor may be started for you. °This information is not intended to replace advice given to you by your health care provider. Make sure you discuss any questions you have with your health care provider. °Document Released: 07/12/2008 Document Revised: 01/04/2016 Document Reviewed: 01/04/2016 °Elsevier Interactive Patient Education © 2017 [employer] ° °

## 2016-11-21 NOTE — MAU Note (Signed)
Urine in the lab  

## 2016-11-22 LAB — GC/CHLAMYDIA PROBE AMP (~~LOC~~) NOT AT ARMC
CHLAMYDIA, DNA PROBE: NEGATIVE
NEISSERIA GONORRHEA: NEGATIVE

## 2017-04-12 IMAGING — US US PELVIS COMPLETE
1 series · 15 of 25 positions shown · non-contrast
Comparison: CT abdomen pelvis dated 09/12/2011

CLINICAL DATA: Lower abdominal pain x3 weeks, history of ovarian
cyst, Mirena IUD

EXAM:
TRANSABDOMINAL AND TRANSVAGINAL ULTRASOUND OF PELVIS
TECHNIQUE: Both transabdominal and transvaginal ultrasound examinations of the
pelvis were performed. Transabdominal technique was performed for
global imaging of the pelvis including uterus, ovaries, adnexal
regions, and pelvic cul-de-sac. It was necessary to proceed with
endovaginal exam following the transabdominal exam to visualize the
bilateral ovaries.

[Series 1: us pelvis complete · 87 acquisitions, 15 frames shown]
[im 1/87]
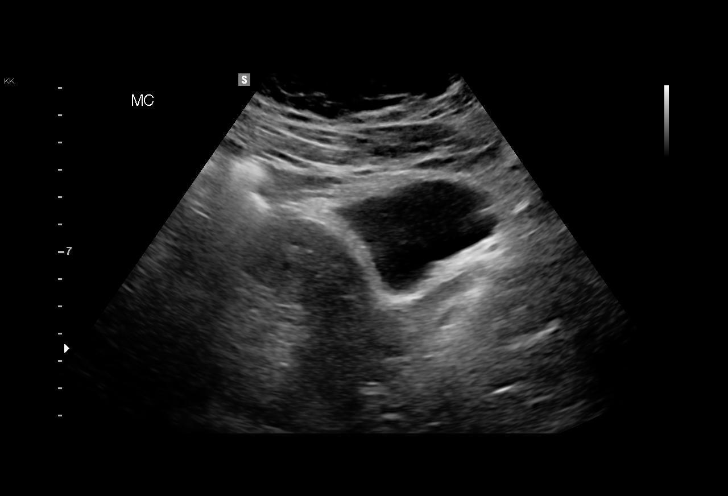
[im 8/87]
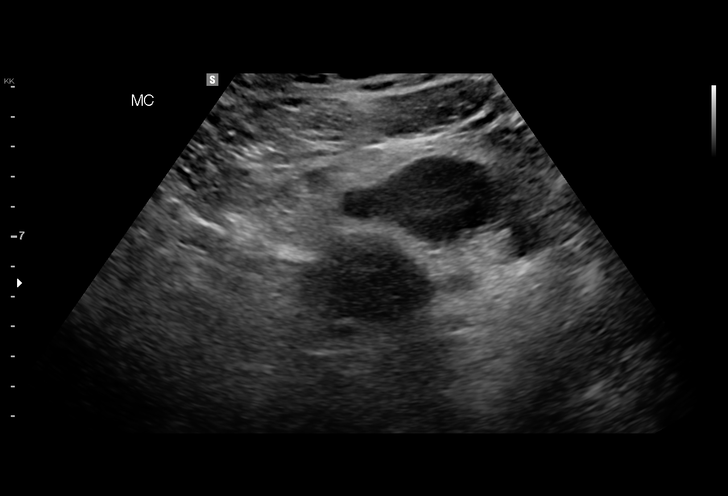
[im 15/87]
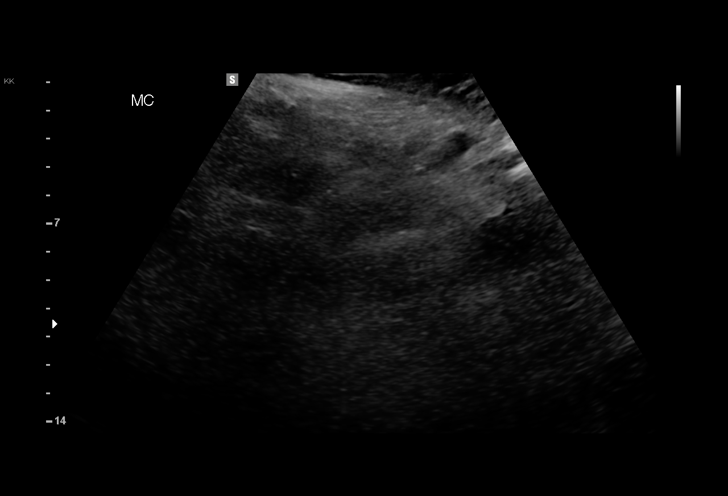
[im 18/87]
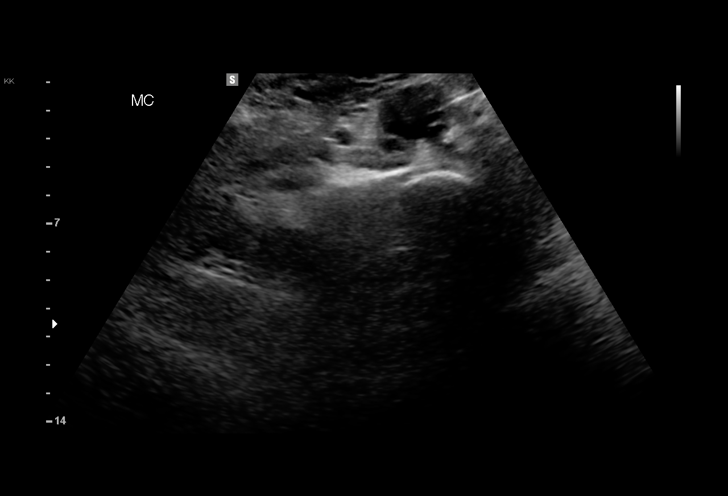
[im 26/87]
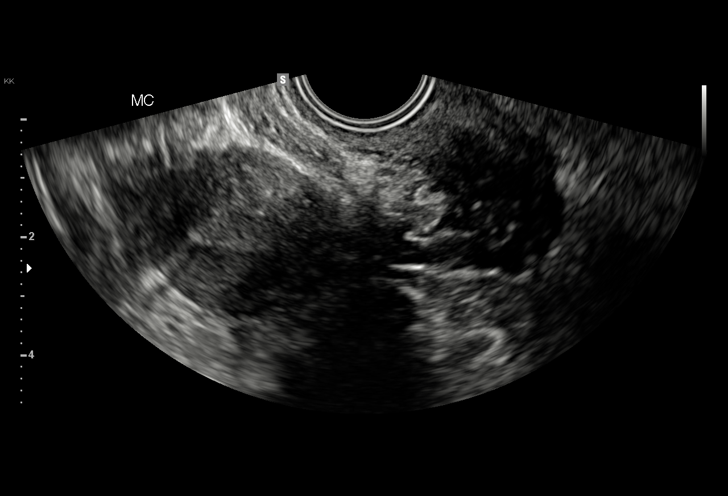
[im 33/87]
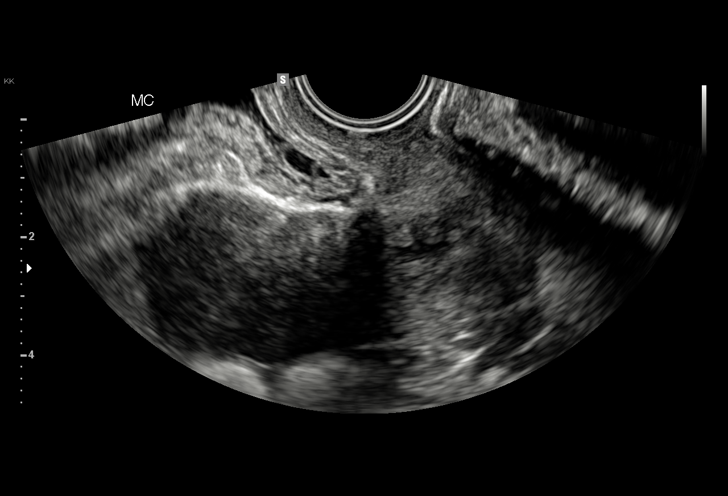
[im 36/87]
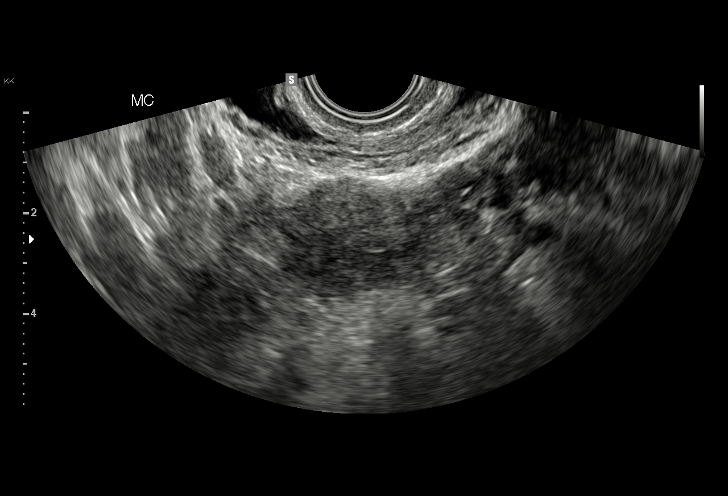
[im 44/87]
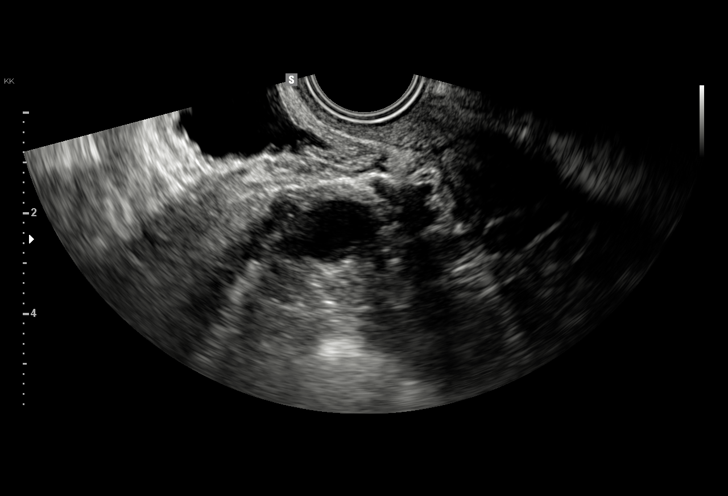
[im 51/87]
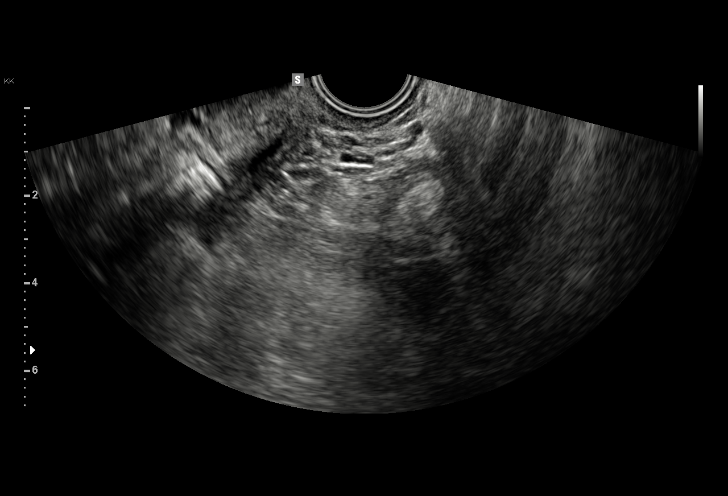
[im 54/87]
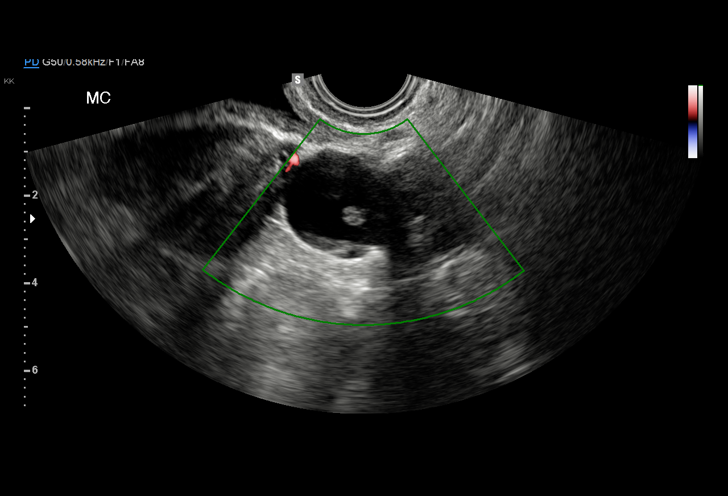
[im 61/87]
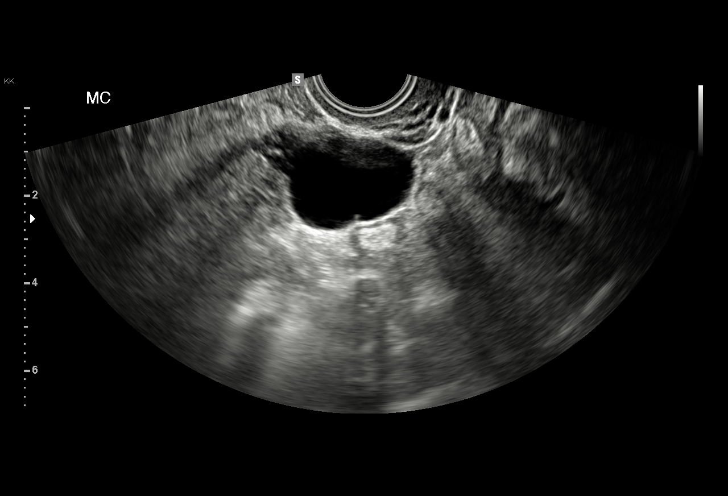
[im 69/87]
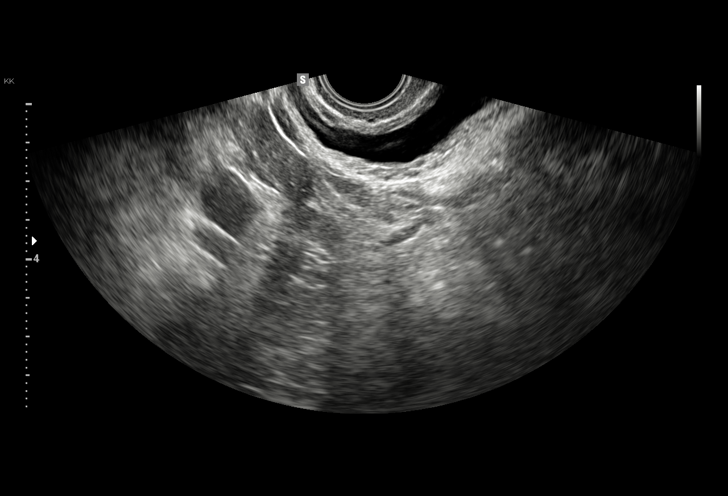
[im 72/87]
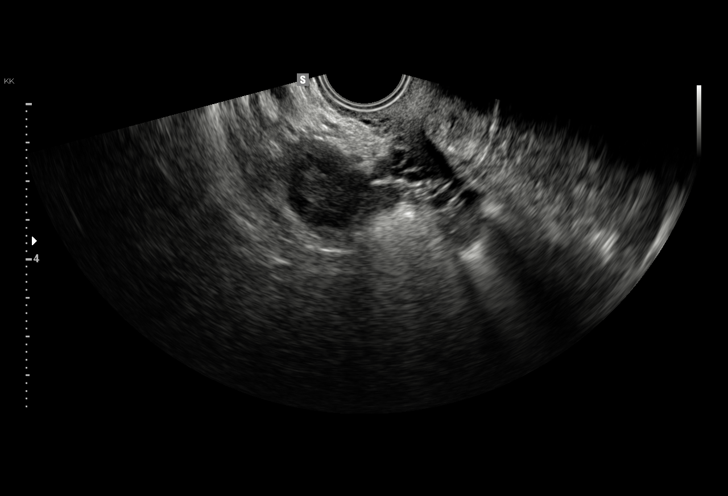
[im 79/87]
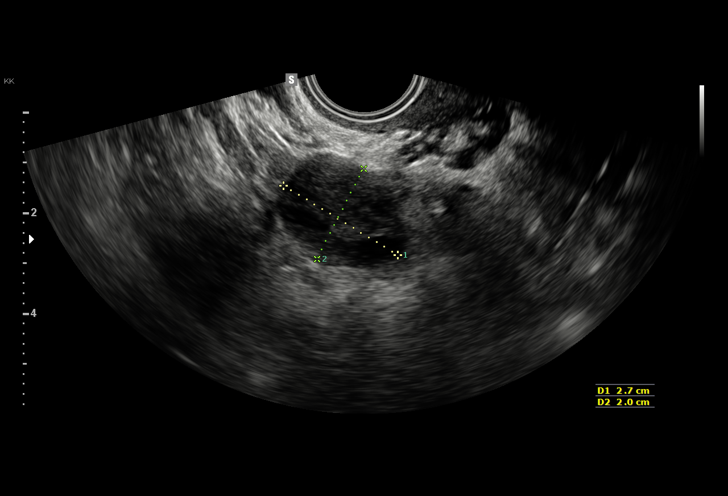
[im 87/87]
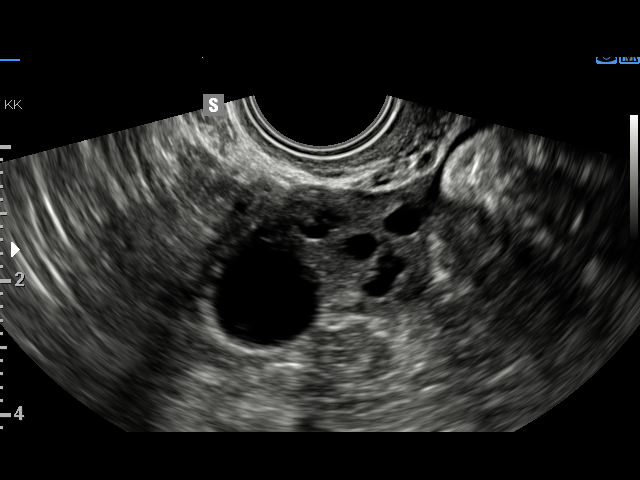

[15 of 25 positions shown; findings below may reference images not displayed]

FINDINGS: Uterus

Measurements: 7.0 x 3.2 x 3.6 cm. No fibroids or other mass
visualized.

Endometrium

Thickness: 6 mm. IUD in satisfactory position in the uterine fundus.

Right ovary

Measurements: 2.7 x 2.0 x 2.6 cm. Normal appearance/no adnexal mass.

Left ovary

Measurements: 3.0 x 1.9 x 4.5 cm. 3.1 x 2.3 x 2.8 cm cystic lesion
with 10 mm mural nodule (image 66).

Other findings

No abnormal free fluid.
IMPRESSION: 3.1 cm cystic left ovarian lesion with 10 mm mural nodule. Consider
MR pelvis with/ without contrast for further evaluation.

IUD in satisfactory position in the uterine fundus.

## 2017-04-20 IMAGING — MR MR PELVIS WO/W CM
8 of 9 series · 35 of 48 positions shown · IV contrast (multihance)
Comparison: Ultrasound [DATE].  CT 09/12/2011.

CLINICAL DATA: Pelvic cramping. Ultrasound demonstrating a left
ovarian cystic lesion with mural nodule.

EXAM:
MRI PELVIS WITHOUT AND WITH CONTRAST
TECHNIQUE: Multiplanar multisequence MR imaging of the pelvis was performed
both before and after administration of intravenous contrast.
CONTRAST:  19mL MULTIHANCE GADOBENATE DIMEGLUMINE 529 MG/ML IV SOLN

[Series 2: T2 · coronal · 5.0mm · 1.25mm/px · 2 of 25 slices shown (1 of 3)]
[im 1/25]
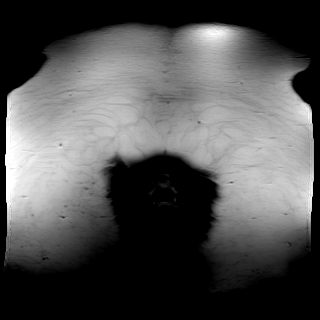
[im 25/25]
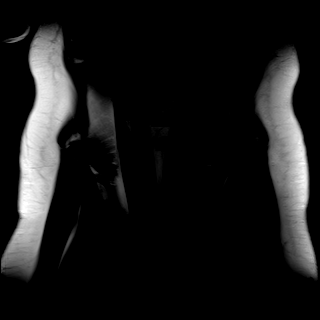

[Series 3: T2 · sagittal · 5.0mm · 0.78mm/px · 2 of 25 slices shown (2 of 3)]
[im 1/25]
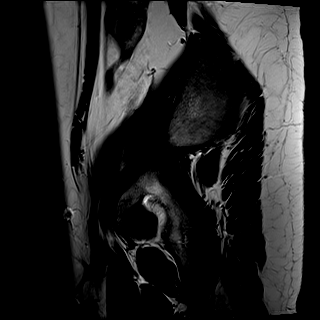
[im 25/25]
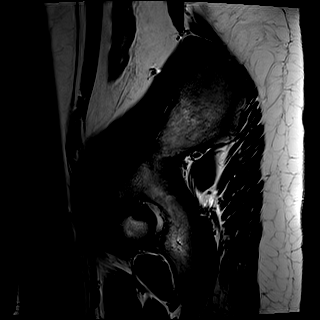

[Series 4: T2 · axial · 5.0mm · 0.41mm/px · z∈[-144,+36]mm · 2 of 31 slices shown (3 of 3)]
[im 1/31]
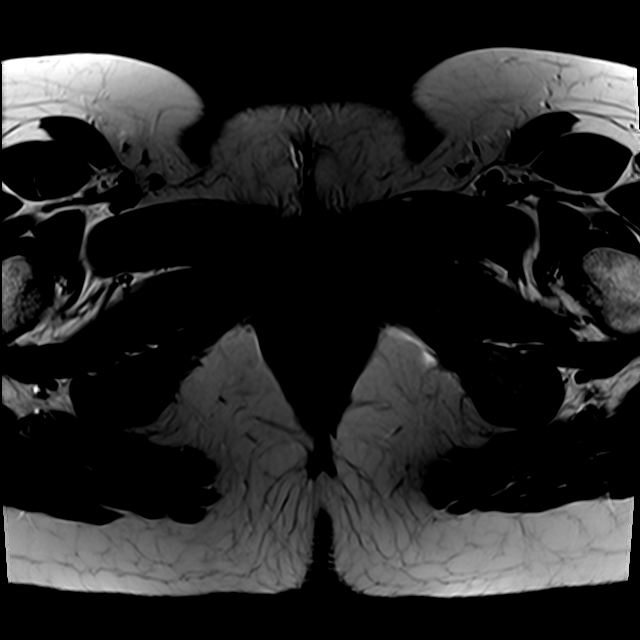
[im 31/31]
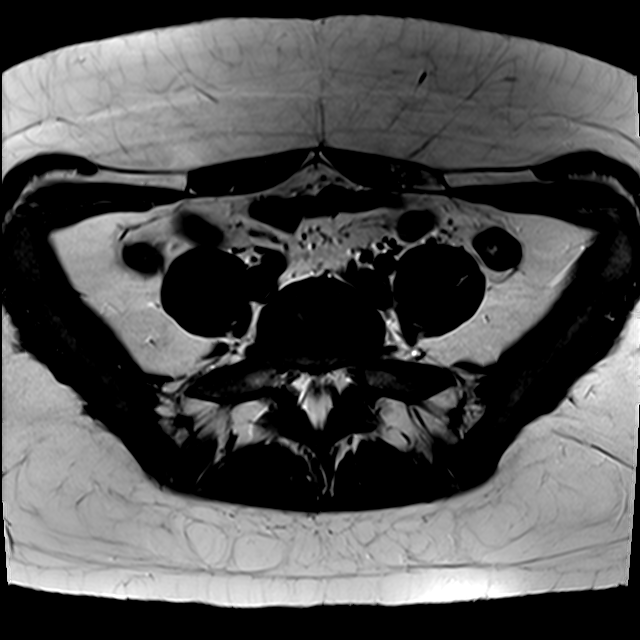

[Series 5: T2 fat-sat · axial · 5.0mm · 0.41mm/px · z∈[-144,+36]mm · 2 of 31 slices shown]
[im 1/31]
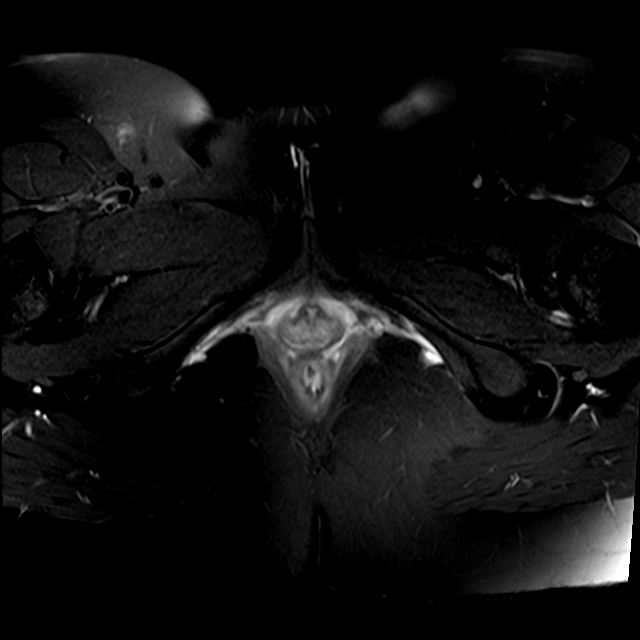
[im 31/31]
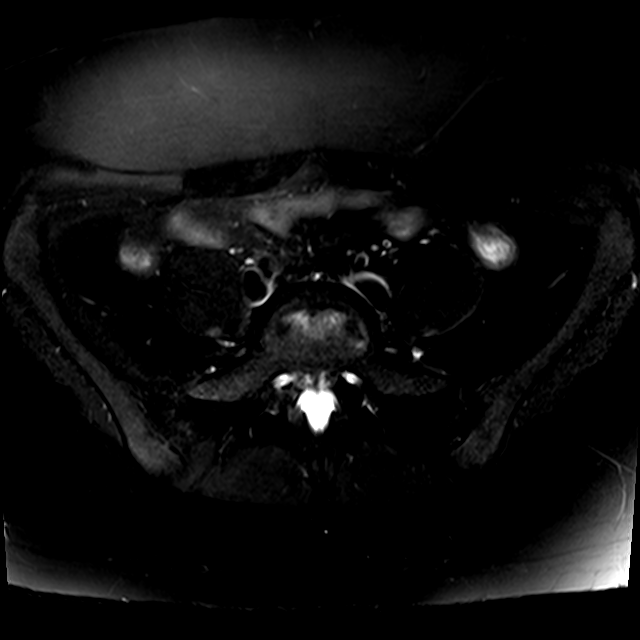

[Series 6: T1 fat-sat · axial · 1.2mm · 0.75mm/px · z∈[-139,+32]mm · 8 of 144 slices shown (1 of 2)]
[im 1/144]
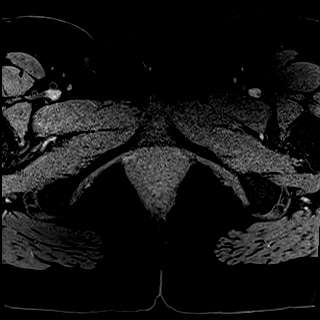
[im 16/144]
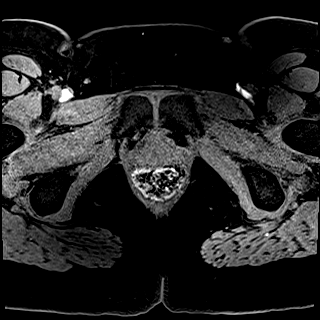
[im 48/144]
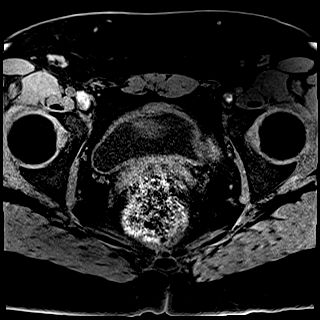
[im 64/144]
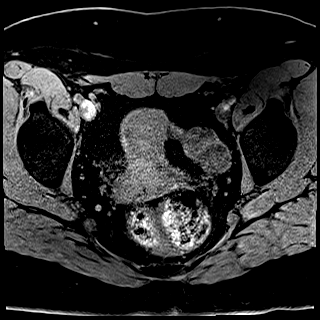
[im 80/144]
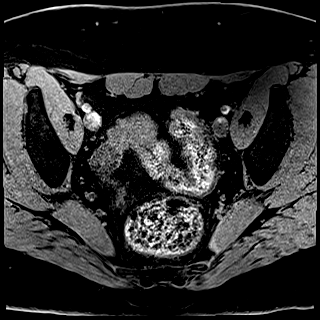
[im 96/144]
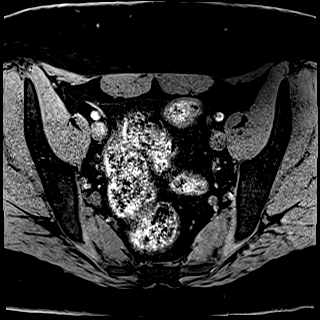
[im 128/144]
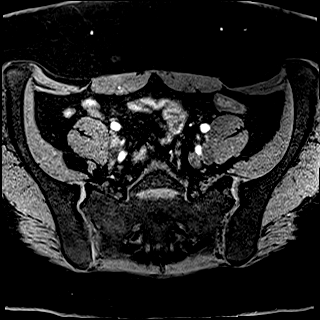
[im 144/144]
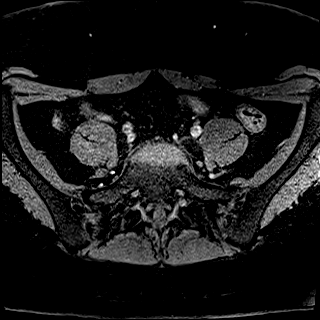

[Series 7: T1 · axial · 1.2mm · 0.75mm/px · z∈[-139,+32]mm · 8 of 144 slices shown]
[im 1/144]
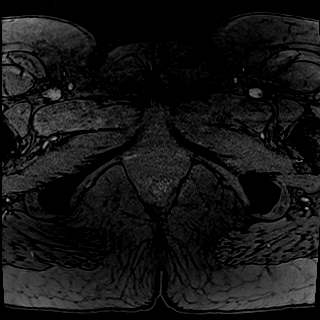
[im 16/144]
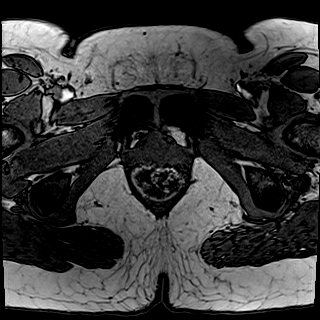
[im 48/144]
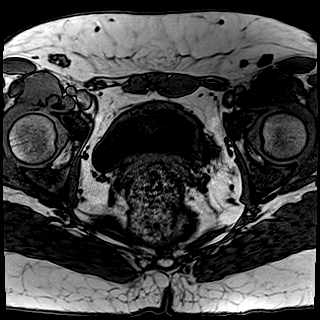
[im 64/144]
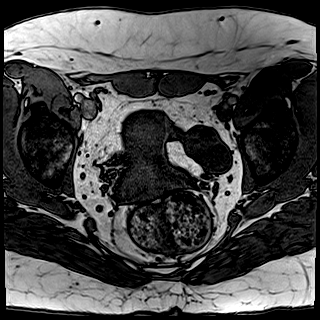
[im 80/144]
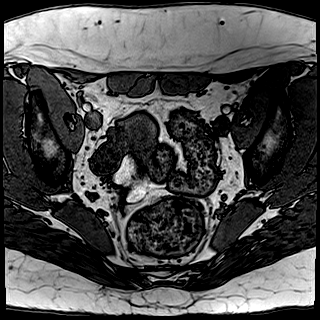
[im 96/144]
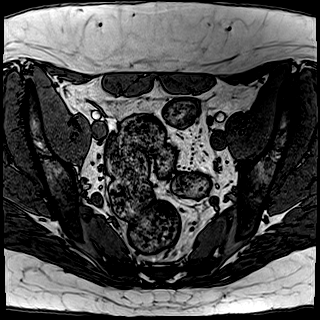
[im 128/144]
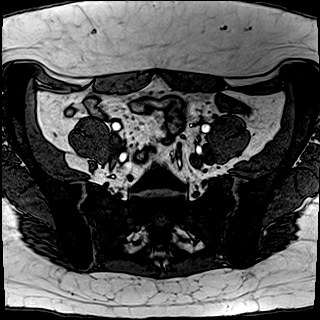
[im 144/144]
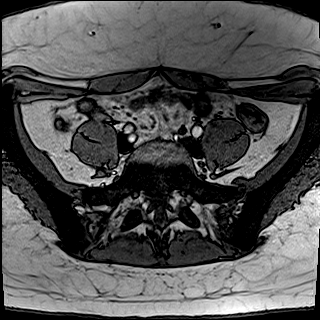

[Series 8: T1 fat-sat post-contrast · axial · 1.2mm · 0.75mm/px · z∈[-139,+32]mm · 8 of 144 slices shown]
[im 1/144]
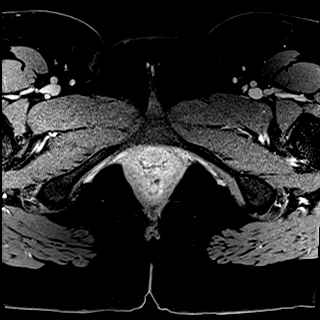
[im 16/144]
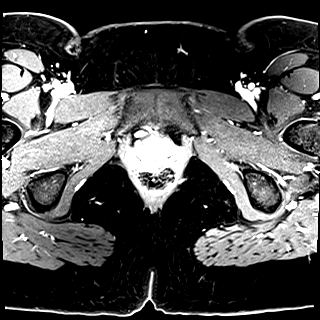
[im 48/144]
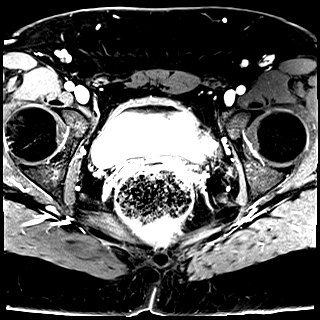
[im 64/144]
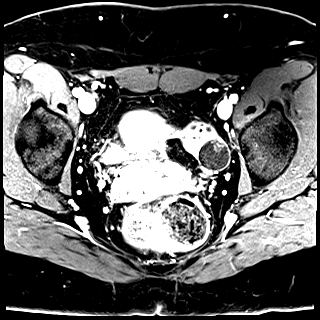
[im 80/144]
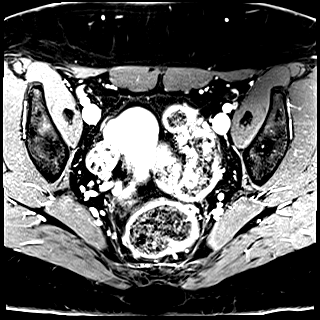
[im 96/144]
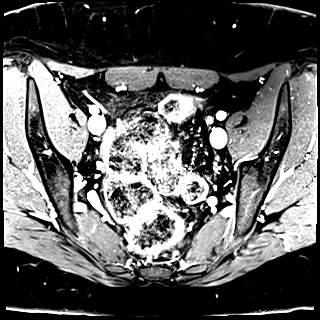
[im 128/144]
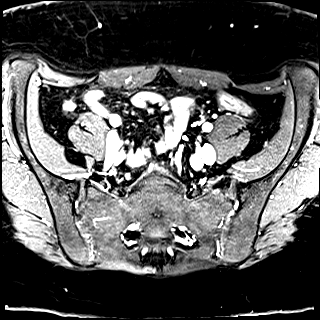
[im 144/144]
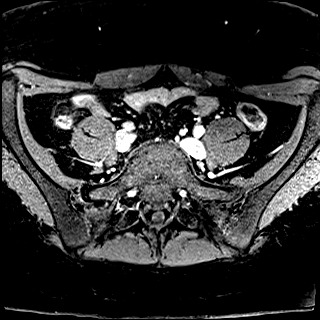

[Series 9: T1 fat-sat · coronal · 1.2mm · 0.75mm/px · 3 of 120 slices shown (2 of 2)]
[im 1/120]
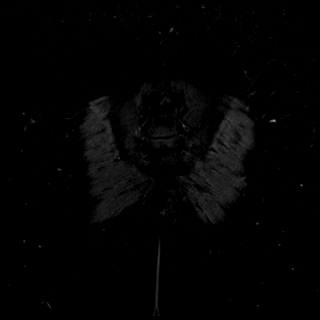
[im 18/120]
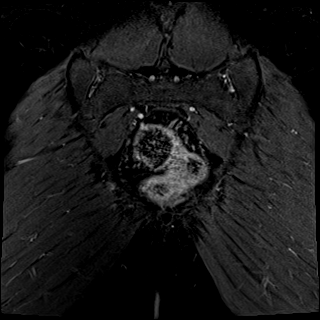
[im 35/120]
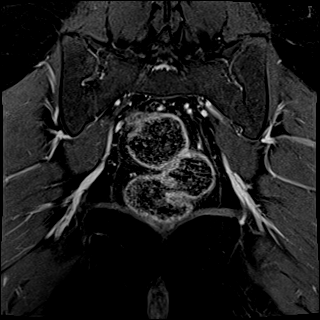

[35 of 48 positions shown; findings below may reference images not displayed]

FINDINGS: Uterus:  Normal in appearance.  Intrauterine device in place.

Endometrium: Suboptimally evaluated secondary to the intrauterine
device. No endometrial thickening identified.

Cervix/Vagina: T2 hypointense cervical stroma maintained. Normal
vagina.

Right ovary: Normal in appearance, with multiple small follicles
within.

Left ovary: Corresponding to the ultrasound abnormality, off the
posterior lateral aspect of the otherwise normal-appearing left
ovary, is a 2.4 x 2.2 by 3.1 cm lesion. This is T2 hyperintense and
T1 hypo intense. Demonstrates an enhancing nodule within its
anterior lateral portion of approximately 9 mm, including on image
81/series 8 and image 80/series 9.

Urinary Tract: Within normal limits. No distal hydroureter or
bladder abnormality.

Bowel:  Large colonic stool burden.  Normal pelvic small bowel.

Vascular/Lymphatic: Normal pelvic vasculature, without sidewall
adenopathy.

Other:  No significant free fluid.

Musculoskeletal:  L4-5 disc desiccation and mild bulge.
IMPRESSION: 1. Left ovarian cystic lesion with 8 mm enhancing nodule within.
This is suspicious for an ovarian neoplasm, most likely benign or
borderline malignant. Surgical consultation should be considered.
2.  Possible constipation.
3. Appropriate position of intrauterine device.
These results will be called to the ordering clinician or
representative by the Radiologist Assistant, and communication
documented in the PACS or zVision Dashboard.

## 2017-06-23 ENCOUNTER — Encounter (HOSPITAL_COMMUNITY): Payer: Self-pay

## 2020-03-21 ENCOUNTER — Inpatient Hospital Stay (HOSPITAL_COMMUNITY)
Admission: AD | Admit: 2020-03-21 | Discharge: 2020-03-21 | Disposition: A | Discharge status: Home / Self Care | Payer: Medicaid Other | Attending: Obstetrics and Gynecology | Admitting: Obstetrics and Gynecology

## 2020-03-21 ENCOUNTER — Other Ambulatory Visit: Payer: Self-pay

## 2020-03-21 ENCOUNTER — Encounter (HOSPITAL_COMMUNITY): Payer: Self-pay | Admitting: Obstetrics and Gynecology

## 2020-03-21 ENCOUNTER — Inpatient Hospital Stay (HOSPITAL_COMMUNITY): Payer: Medicaid Other

## 2020-03-21 DIAGNOSIS — Z881 Allergy status to other antibiotic agents status: Secondary | ICD-10-CM | POA: Diagnosis not present

## 2020-03-21 DIAGNOSIS — Z87891 Personal history of nicotine dependence: Secondary | ICD-10-CM | POA: Diagnosis not present

## 2020-03-21 DIAGNOSIS — Z8759 Personal history of other complications of pregnancy, childbirth and the puerperium: Secondary | ICD-10-CM | POA: Diagnosis not present

## 2020-03-21 DIAGNOSIS — R102 Pelvic and perineal pain: Secondary | ICD-10-CM | POA: Diagnosis not present

## 2020-03-21 DIAGNOSIS — O09291 Supervision of pregnancy with other poor reproductive or obstetric history, first trimester: Secondary | ICD-10-CM | POA: Insufficient documentation

## 2020-03-21 DIAGNOSIS — O209 Hemorrhage in early pregnancy, unspecified: Secondary | ICD-10-CM | POA: Insufficient documentation

## 2020-03-21 DIAGNOSIS — O3680X0 Pregnancy with inconclusive fetal viability, not applicable or unspecified: Secondary | ICD-10-CM | POA: Diagnosis not present

## 2020-03-21 DIAGNOSIS — F319 Bipolar disorder, unspecified: Secondary | ICD-10-CM | POA: Insufficient documentation

## 2020-03-21 DIAGNOSIS — O26899 Other specified pregnancy related conditions, unspecified trimester: Secondary | ICD-10-CM

## 2020-03-21 DIAGNOSIS — Z3A01 Less than 8 weeks gestation of pregnancy: Secondary | ICD-10-CM | POA: Diagnosis not present

## 2020-03-21 DIAGNOSIS — R109 Unspecified abdominal pain: Secondary | ICD-10-CM

## 2020-03-21 DIAGNOSIS — Z885 Allergy status to narcotic agent status: Secondary | ICD-10-CM | POA: Insufficient documentation

## 2020-03-21 HISTORY — DX: Dysmenorrhea, unspecified: N94.6

## 2020-03-21 LAB — URINALYSIS, ROUTINE W REFLEX MICROSCOPIC
Bacteria, UA: NONE SEEN
Bilirubin Urine: NEGATIVE
Glucose, UA: NEGATIVE mg/dL
Hgb urine dipstick: NEGATIVE
Ketones, ur: NEGATIVE mg/dL
Nitrite: NEGATIVE
Protein, ur: NEGATIVE mg/dL
Specific Gravity, Urine: 1.012 (ref 1.005–1.030)
pH: 8 (ref 5.0–8.0)

## 2020-03-21 LAB — COMPREHENSIVE METABOLIC PANEL
ALT: 40 U/L (ref 0–44)
AST: 29 U/L (ref 15–41)
Albumin: 4.3 g/dL (ref 3.5–5.0)
Alkaline Phosphatase: 55 U/L (ref 38–126)
Anion gap: 9 (ref 5–15)
BUN: 11 mg/dL (ref 6–20)
CO2: 28 mmol/L (ref 22–32)
Calcium: 9.5 mg/dL (ref 8.9–10.3)
Chloride: 105 mmol/L (ref 98–111)
Creatinine, Ser: 0.83 mg/dL (ref 0.44–1.00)
GFR, Estimated: >60 mL/min (ref >60)
Glucose, Bld: 99 mg/dL (ref 70–99)
Potassium: 4.1 mmol/L (ref 3.5–5.1)
Sodium: 142 mmol/L (ref 135–145)
Total Bilirubin: 0.7 mg/dL (ref 0.3–1.2)
Total Protein: 6.6 g/dL (ref 6.5–8.1)

## 2020-03-21 LAB — CBC
HCT: 41.4 % (ref 36.0–46.0)
Hemoglobin: 13.7 g/dL (ref 12.0–15.0)
MCH: 29.9 pg (ref 26.0–34.0)
MCHC: 33.1 g/dL (ref 30.0–36.0)
MCV: 90.4 fL (ref 80.0–100.0)
Platelets: 279 10*3/uL (ref 150–400)
RBC: 4.58 MIL/uL (ref 3.87–5.11)
RDW: 12.6 % (ref 11.5–15.5)
WBC: 7.9 10*3/uL (ref 4.0–10.5)
nRBC: 0 % (ref 0.0–0.2)

## 2020-03-21 LAB — HCG, QUANTITATIVE, PREGNANCY: hCG, Beta Chain, Quant, S: 678 m[IU]/mL — ABNORMAL HIGH (ref <5)

## 2020-03-21 LAB — POCT PREGNANCY, URINE: Preg Test, Ur: POSITIVE — AB

## 2020-03-21 NOTE — MAU Provider Note (Signed)
History     CSN: [id number]  Arrival date and time: 03/21/20 1124  First Provider Initiated Contact with Patient 03/21/20 1218      Chief Complaint  Patient presents with  . Abdominal Pain   HPI Danielle Evans is a 29 y.o. [id number] at [redacted]w[redacted]d by LMP who presents to MAU with chief complaint of abdominal cramping. This is a new problem, onset overnight Sunday 11/21 into Monday 11/22. She also reports vaginal bleeding, onset early Monday morning and improving over time. She reports suprapubic and RLQ tenderness to deep palpation.She reports 3 positive home pregnancy tests.  Patient's OB history is significant for ectopic pregnancies in 2015 and 2017. She believes she received Methotrexate for her first ectopic and the second resulted in a left salpingectomy.  OB History    Gravida  6   Para  1   Term  1   Preterm      AB  4   Living  1     SAB  2   TAB      Ectopic  2   Multiple      Live Births              Past Medical History:  Diagnosis Date  . Attention and concentration deficit   . Bipolar 1 disorder ([place])   . Dysmenorrhea   . Ectopic pregnancy   . Exposure to STD   . GERD (gastroesophageal reflux disease)   . Headache    Migraines  . 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 Ovarian cyst   . Pneumonia   . Sleep apnea    childhood, adenoidectomy  . Vaginal Pap smear, abnormal 2014    Past Surgical History:  Procedure Laterality Date  . DILATION AND CURETTAGE OF UTERUS    . LAPAROSCOPIC UNILATERAL SALPINGECTOMY Left 11/21/2015   Procedure: LAPAROSCOPIC UNILATERAL SALPINGECTOMY;  Surgeon: Woodroe Mode, MD;  Location: [facility];  Service: Gynecology;  Laterality: Left;  . LAPAROSCOPY N/A 11/21/2015   Procedure: LAPAROSCOPY DIAGNOSTIC;  Surgeon: Woodroe Mode, MD;  Location: [facility];  Service: Gynecology;  Laterality: N/A;  . NASAL SINUS SURGERY    . TONSILECTOMY, ADENOIDECTOMY,  BILATERAL MYRINGOTOMY AND TUBES    . TONSILLECTOMY    . TUBAL LIGATION      Family History  Problem Relation Age of Onset  . Heart disease Father   . Cancer Maternal Grandmother        breast  . Parkinsonism Maternal Grandfather   . Cancer Paternal Aunt        breast     Social History   Tobacco Use  . Smoking status: Former Smoker    Years: 3.00    Quit date: 10/23/2011    Years since quitting: 8.4  . Smokeless tobacco: Never Used  Substance Use Topics  . Alcohol use: No    Alcohol/week: 3.0 standard drinks    Types: 3 Standard drinks or equivalent per week    Comment: not with preg  . Drug use: No    Comment: hx of marijuana, not with preg    Allergies:  Allergies  Allergen Reactions  . Ceclor [Cefaclor] Hives  . Morphine And Related Itching  . Promethazine Nausea And Vomiting    No medications prior to admission.    Review of Systems  Gastrointestinal: Positive for abdominal pain.  Genitourinary: Positive for vaginal bleeding.  All other systems  reviewed and are negative.  Physical Exam   Blood pressure 117/63, pulse 74, temperature 98.4 F (36.9 C), temperature source Oral, resp. rate 18, weight 86.4 kg, last menstrual period 02/26/2020, SpO2 100 %, unknown if currently breastfeeding.  Physical Exam Vitals and nursing note reviewed. Exam conducted with a chaperone present.  Constitutional:      General: She is not in acute distress.    Appearance: She is well-developed. She is not toxic-appearing.  Cardiovascular:     Rate and Rhythm: Normal rate.     Heart sounds: Normal heart sounds.  Pulmonary:     Effort: Pulmonary effort is normal.     Breath sounds: Normal breath sounds.  Abdominal:     General: Abdomen is flat. Bowel sounds are normal.     Palpations: Abdomen is soft.     Tenderness: There is generalized abdominal tenderness and tenderness in the suprapubic area. There is no right CVA tenderness or left CVA tenderness.  Skin:    General:  Skin is warm and dry.     Capillary Refill: Capillary refill takes less than 2 seconds.  Neurological:     General: No focal deficit present.     Mental Status: She is alert.  Psychiatric:        Mood and Affect: Mood normal.        Behavior: Behavior normal.     MAU Course  Procedures  Orders Placed This Encounter  Procedures  . US OB LESS THAN 14 WEEKS WITH OB TRANSVAGINAL    Hx ectopic x 2    Standing Status:   Standing    Number of Occurrences:   1    Order Specific Question:   Symptom/Reason for Exam    Answer:   Abdominal cramping affecting pregnancy [[id number]]  . Urinalysis, Routine w reflex microscopic Urine, Clean Catch    Standing Status:   Standing    Number of Occurrences:   1  . CBC    Standing Status:   Standing    Number of Occurrences:   1  . Comprehensive metabolic panel    Standing Status:   Standing    Number of Occurrences:   1  . hCG, quantitative, pregnancy    Standing Status:   Standing    Number of Occurrences:   1  . Pregnancy, urine POC    Standing Status:   Standing    Number of Occurrences:   1   Patient Vitals for the past 24 hrs:  BP Temp Temp src Pulse Resp SpO2 Weight  03/21/20 1343 117/63 98.4 F (36.9 C) Oral 74 18 100 % --  03/21/20 1216 122/69 98.1 F (36.7 C) Oral 71 18 99 % --  03/21/20 1140 118/66 98.2 F (36.8 C) -- 70 19 -- 86.4 kg   Results for orders placed or performed during the hospital encounter of 03/21/20 (from the past 24 hour(s))  Pregnancy, urine POC     Status: Abnormal   Collection Time: 03/21/20 11:39 AM  Result Value Ref Range   Preg Test, Ur POSITIVE (A) NEGATIVE  Urinalysis, Routine w reflex microscopic Urine, Clean Catch     Status: Abnormal   Collection Time: 03/21/20 11:41 AM  Result Value Ref Range   Color, Urine YELLOW YELLOW   APPearance CLEAR CLEAR   Specific Gravity, Urine 1.012 1.005 - 1.030   pH 8.0 5.0 - 8.0   Glucose, UA NEGATIVE NEGATIVE mg/dL   Hgb urine dipstick NEGATIVE NEGATIVE  Bilirubin Urine NEGATIVE NEGATIVE   Ketones, ur NEGATIVE NEGATIVE mg/dL   Protein, ur NEGATIVE NEGATIVE mg/dL   Nitrite NEGATIVE NEGATIVE   Leukocytes,Ua SMALL (A) NEGATIVE   RBC / HPF 0-5 0 - 5 RBC/hpf   WBC, UA 0-5 0 - 5 WBC/hpf   Bacteria, UA NONE SEEN NONE SEEN   Squamous Epithelial / LPF 0-5 0 - 5  CBC     Status: None   Collection Time: 03/21/20 12:11 PM  Result Value Ref Range   WBC 7.9 4.0 - 10.5 K/uL   RBC 4.58 3.87 - 5.11 MIL/uL   Hemoglobin 13.7 12.0 - 15.0 g/dL   HCT 41.4 36 - 46 %   MCV 90.4 80.0 - 100.0 fL   MCH 29.9 26.0 - 34.0 pg   MCHC 33.1 30.0 - 36.0 g/dL   RDW 12.6 11.5 - 15.5 %   Platelets 279 150 - 400 K/uL   nRBC 0.0 0.0 - 0.2 %  Comprehensive metabolic panel     Status: None   Collection Time: 03/21/20 12:11 PM  Result Value Ref Range   Sodium 142 135 - 145 mmol/L   Potassium 4.1 3.5 - 5.1 mmol/L   Chloride 105 98 - 111 mmol/L   CO2 28 22 - 32 mmol/L   Glucose, Bld 99 70 - 99 mg/dL   BUN 11 6 - 20 mg/dL   Creatinine, Ser 0.83 0.44 - 1.00 mg/dL   Calcium 9.5 8.9 - 10.3 mg/dL   Total Protein 6.6 6.5 - 8.1 g/dL   Albumin 4.3 3.5 - 5.0 g/dL   AST 29 15 - 41 U/L   ALT 40 0 - 44 U/L   Alkaline Phosphatase 55 38 - 126 U/L   Total Bilirubin 0.7 0.3 - 1.2 mg/dL   GFR, Estimated >60 >60 mL/min   Anion gap 9 5 - 15  hCG, quantitative, pregnancy     Status: Abnormal   Collection Time: 03/21/20 12:11 PM  Result Value Ref Range   hCG, Beta Chain, Quant, S 678 (H) <5 mIU/mL   US OB LESS THAN 14 WEEKS WITH OB TRANSVAGINAL  Result Date: 03/21/2020 CLINICAL DATA:  Vaginal bleeding, pain, and cramping in early pregnancy. Prior history of ectopic pregnancy. EXAM: OBSTETRIC <14 WK [place] AND TRANSVAGINAL OB US TECHNIQUE: Both transabdominal and transvaginal ultrasound examinations were performed for complete evaluation of the gestation as well as the maternal uterus, adnexal regions, and pelvic cul-de-sac. Transvaginal technique was performed to assess early pregnancy.  COMPARISON:  None. FINDINGS: Intrauterine gestational sac: None Maternal uterus/adnexae: Endometrial thickness measures 8 mm. No fibroids identified. The left ovary is normal appearance. The right ovary contains 2 adjacent simple cysts measuring 3.0 cm and 3.7 cm in maximum diameters. No other adnexal mass or abnormal free fluid identified. IMPRESSION: Pregnancy of unknown anatomic location (no intrauterine gestational sac or suspicious adnexal mass identified). Differential diagnosis includes recent spontaneous abortion, IUP too early to visualize, and non-visualized ectopic pregnancy. Recommend correlation with serial beta-hCG levels, and follow up US if warranted clinically. Electronically Signed   By: Marlaine Hind M.D.   On: 03/21/2020 13:00   Assessment and Plan  --29 y.o. [id number] with pregnancy of unknown location --Bleeding, currently resolved --Blood type A POS --Quant hCG 678 --Discharge home in stable condition with ectopic precautions  F/U: --Return to MAU for repeat stat Quant hCG 03/23/2020 after 12pm   Darlina Rumpf, CNM 03/21/2020, 4:31 PM

## 2020-03-21 NOTE — Discharge Instructions (Signed)
Prenatal Care Providers           [facility]  [place] [phone]  [facility] @ [place]  [phone]  [place] @ [facility]       [address] [phone]            [facility] @ [place]     [phone]          [facility] [place] @ [facility]   [place] [address] [phone]  [facility] [place] @ [place] [phone]     [facility] [place] @ [address] Linna Hoff)  [place]   [phone]     [place] [facility]  Phone: [facility]  Phone: [facility] Phone: [phone]  [facility] Phone: [phone]  [facility] Phone: [phone]  [facility] Phone: [phone]  [facility]  Phone: [phone]     Human Chorionic Gonadotropin Test Why am I having this test? A human chorionic gonadotropin (hCG) test is done to determine whether you are pregnant. It can also be used:  To diagnose an abnormal pregnancy.  To determine whether you have had a failed pregnancy (miscarriage) or are at risk of one. What is being tested? This test checks the level of the human chorionic gonadotropin (hCG) hormone in the blood. This hormone is produced during pregnancy by the cells that form the placenta. The placenta is the organ that grows inside your womb (uterus) to nourish a developing baby. When you are pregnant, hCG can be detected in your blood or urine 7 to 8 days before your missed period. It continues to go up for the first 8-10 weeks of pregnancy. The presence of hCG in your blood can be measured with several different types of tests. You may have:  A urine test. ? Because this hormone  is eliminated from your body by your kidneys, you may have a urine test to find out whether you are pregnant. A home pregnancy test detects whether there is hCG in your urine. ? A urine test only shows whether there is hCG in your urine. It does not measure how much.  A qualitative blood test. ? You may have this type of blood test to find out if you are pregnant. ? This blood test only shows whether there is hCG in your blood. It does not measure how much.  A quantitative blood test. ? This type of blood test measures the amount of hCG in your blood. ? You may have this test to:  Diagnose an abnormal pregnancy.  Check whether you have had a miscarriage.  Determine whether you are at risk of a miscarriage. What kind of sample is taken?     Two kinds of samples may be collected to test for the hCG hormone.  Blood. It is usually collected by inserting a needle into a blood vessel.  Urine. It is usually collected by urinating into a germ-free (sterile) specimen cup. It is best to collect the sample the first time you urinate in the morning. How do I prepare for this test? No preparation is needed for a blood test.  For the urine test:  Let your health care provider know about: ? All medicines  you are taking, including vitamins, herbs, creams, and over-the-counter medicines. ? Any blood in your urine. This may interfere with the result.  Do not drink too much fluid. Drink as you normally would, or as directed by your health care provider. How are the results reported? Depending on the type of test that you have, your test results may be reported as values. Your health care provider will compare your results to normal ranges that were established after testing a large group of people (reference ranges). Reference ranges may vary among labs and hospitals. For this test, common reference ranges that show absence of pregnancy are:  Quantitative hCG blood levels: less than 5 IU/L. Other  results will be reported as either positive or negative. For this test, normal results (meaning the absence of pregnancy) are:  Negative for hCG in the urine test.  Negative for hCG in the qualitative blood test. What do the results mean? Urine and qualitative blood test  A negative result could mean: ? That you are not pregnant. ? That the test was done too early in your pregnancy to detect hCG in your blood or urine. If you still have other signs of pregnancy, the test will be repeated.  A positive result means: ? That you are most likely pregnant. Your health care provider may confirm your pregnancy with an imaging study (ultrasound) of your uterus, if needed. Quantitative blood test Results of the quantitative hCG blood test will be interpreted as follows:  Less than 5 IU/L: You are most likely not pregnant.  Greater than 25 IU/L: You are most likely pregnant.  hCG levels that are higher than expected: ? You are pregnant with twins. ? You have abnormal growths in the uterus.  hCG levels that are rising more slowly than expected: ? You have an ectopic pregnancy (also called a tubal pregnancy).  hCG levels that are falling: ? You may be having a miscarriage. Talk with your health care provider about what your results mean. Questions to ask your health care provider Ask your health care provider, or the department that is doing the test:  When will my results be ready?  How will I get my results?  What are my treatment options?  What other tests do I need?  What are my next steps? Summary  A human chorionic gonadotropin test is done to determine whether you are pregnant.  When you are pregnant, hCG can be detected in your blood or urine 7 to 8 days before your missed period. It continues to go up for the first 8-10 weeks of pregnancy.  Your hCG level can be measured with different types of tests. You may have a urine test, a qualitative blood test, or a quantitative  blood test.  Talk with your health care provider about what your results mean. This information is not intended to replace advice given to you by your health care provider. Make sure you discuss any questions you have with your health care provider. Document Revised: 03/17/2017 Document Reviewed: 03/17/2017 Elsevier Patient Education  2020 [employer].

## 2020-03-21 NOTE — MAU Note (Signed)
Patient reports abdominal pain that started on Sunday and some vaginal bleeding that started Monday morning but has slowed down. Took 3 HPTs that were all positive.  States she is on BCPs and has been taking them consistently.  LMP 02/26/2020.

## 2020-03-23 ENCOUNTER — Other Ambulatory Visit: Payer: Self-pay

## 2020-03-23 ENCOUNTER — Inpatient Hospital Stay (HOSPITAL_COMMUNITY)
Admission: AD | Admit: 2020-03-23 | Discharge: 2020-03-23 | Disposition: A | Discharge status: Home / Self Care | Payer: Medicaid Other | Attending: Obstetrics & Gynecology | Admitting: Obstetrics & Gynecology

## 2020-03-23 ENCOUNTER — Encounter (HOSPITAL_COMMUNITY): Payer: Self-pay | Admitting: Obstetrics & Gynecology

## 2020-03-23 DIAGNOSIS — O3680X0 Pregnancy with inconclusive fetal viability, not applicable or unspecified: Secondary | ICD-10-CM | POA: Insufficient documentation

## 2020-03-23 DIAGNOSIS — Z3A01 Less than 8 weeks gestation of pregnancy: Secondary | ICD-10-CM | POA: Insufficient documentation

## 2020-03-23 LAB — HCG, QUANTITATIVE, PREGNANCY: hCG, Beta Chain, Quant, S: 820 m[IU]/mL — ABNORMAL HIGH (ref <5)

## 2020-03-23 NOTE — MAU Provider Note (Signed)
None     Chief Complaint:  Labs Only   Danielle Evans is  29 y.o. [phone] at [redacted]w[redacted]d presents complaining of Labs Only She was seen in MAU on 03/21/20 w/home + UPT, cramping, and hx of some vaginal bleeding (when she wiped several times) on 11/22.  Hx ectopic pregnancy (chart says two, but clarified via op report/pg test on 11/21/15 that it wasn't a pregnancy, but complex ovarian cyst/hydrosalpinx>L salpingectomy).  Had MTX. [place] was inconclusive for either pg location/viability. Has not had any more bleeding, cramping is now 1/10 on right side. Here to see what HCG level does.   Obstetrical/Gynecological History: OB History    Gravida  5   Para  1   Term  1   Preterm      AB  3   Living  1     SAB  2   TAB      Ectopic  1   Multiple      Live Births           Obstetric Comments  "ectopic" in 2017 was actually an ovarian cyst, removed d/t being complex.  Salpingectomy d/t hydrosalpinx.  Removed from history       Past Medical History: Past Medical History:  Diagnosis Date  . Attention and concentration deficit   . Bipolar 1 disorder ([place])   . Dysmenorrhea   . Ectopic pregnancy   . Exposure to STD   . GERD (gastroesophageal reflux disease)   . Headache    Migraines  . 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 Ovarian cyst   . Pneumonia   . Sleep apnea    childhood, adenoidectomy  . Vaginal Pap smear, abnormal 2014    Past Surgical History: Past Surgical History:  Procedure Laterality Date  . DILATION AND CURETTAGE OF UTERUS    . LAPAROSCOPIC UNILATERAL SALPINGECTOMY Left 11/21/2015   Procedure: LAPAROSCOPIC UNILATERAL SALPINGECTOMY;  Surgeon: Woodroe Mode, MD;  Location: [place]  [facility];  Service: Gynecology;  Laterality: Left;  . LAPAROSCOPY N/A 11/21/2015   Procedure: LAPAROSCOPY DIAGNOSTIC;  Surgeon: Woodroe Mode, MD;  Location: [facility];  Service: Gynecology;  Laterality: N/A;  . NASAL SINUS  SURGERY    . TONSILECTOMY, ADENOIDECTOMY, BILATERAL MYRINGOTOMY AND TUBES    . TONSILLECTOMY    . TUBAL LIGATION      Family History: Family History  Problem Relation Age of Onset  . Heart disease Father   . Cancer Maternal Grandmother        breast  . Parkinsonism Maternal Grandfather   . Cancer Paternal Aunt        breast     Social History: Social History   Tobacco Use  . Smoking status: Former Smoker    Years: 3.00    Quit date: 10/23/2011    Years since quitting: 8.4  . Smokeless tobacco: Never Used  Substance Use Topics  . Alcohol use: No    Alcohol/week: 3.0 standard drinks    Types: 3 Standard drinks or equivalent per week    Comment: not with preg  . Drug use: No    Comment: hx of marijuana, not with preg    Allergies:  Allergies  Allergen Reactions  . Ceclor [Cefaclor] Hives  . Morphine And Related Itching  . Promethazine Nausea And Vomiting    Meds:  No medications prior to admission.    Review of Systems  Constitutional: Negative for fever and chills Eyes: Negative for visual disturbances Respiratory: Negative for shortness of breath, dyspnea Cardiovascular: Negative for chest pain or palpitations  Gastrointestinal: Negative for vomiting, diarrhea and constipation Genitourinary: Negative for dysuria and urgency Musculoskeletal: Negative for back pain, joint pain, myalgias.  Normal ROM  Neurological: Negative for dizziness    Physical Exam  Blood pressure 116/65, pulse 89, temperature 98.2 F (36.8 C), resp. rate 12, last menstrual period 02/26/2020, SpO2 98 %, unknown if currently breastfeeding. GENERAL: Well-developed, well-nourished female in no acute distress.  LUNGS: Normal respiratory effort HEART: Regular rate and rhythm. ABDOMEN: Soft EXTREMITIES: Nontender, no edema, 2+ distal pulses.    Labs: Results for orders placed or performed during the hospital encounter of 03/23/20 (from the past 24 hour(s))  hCG, quantitative,  pregnancy   Collection Time: 03/23/20  3:34 PM  Result Value Ref Range   hCG, Beta Chain, Quant, S 820 (H) <5 mIU/mL   Imaging Studies:  US OB LESS THAN 14 WEEKS WITH OB TRANSVAGINAL  Result Date: 03/21/2020 CLINICAL DATA:  Vaginal bleeding, pain, and cramping in early pregnancy. Prior history of ectopic pregnancy. EXAM: OBSTETRIC <14 WK [place] AND TRANSVAGINAL OB US TECHNIQUE: Both transabdominal and transvaginal ultrasound examinations were performed for complete evaluation of the gestation as well as the maternal uterus, adnexal regions, and pelvic cul-de-sac. Transvaginal technique was performed to assess early pregnancy. COMPARISON:  None. FINDINGS: Intrauterine gestational sac: None Maternal uterus/adnexae: Endometrial thickness measures 8 mm. No fibroids identified. The left ovary is normal appearance. The right ovary contains 2 adjacent simple cysts measuring 3.0 cm and 3.7 cm in maximum diameters. No other adnexal mass or abnormal free fluid identified. IMPRESSION: Pregnancy of unknown anatomic location (no intrauterine gestational sac or suspicious adnexal mass identified). Differential diagnosis includes recent spontaneous abortion, IUP too early to visualize, and non-visualized ectopic pregnancy. Recommend correlation with serial beta-hCG levels, and follow up US if warranted clinically. Electronically Signed   By: Marlaine Hind M.D.   On: 03/21/2020 13:00    Assessment: Danielle Evans is  29 y.o. [id number] at [redacted]w[redacted]d presents with pregnancy with uncertain location/viability  Plan: Repeat HCG on 11/27 Order for [place] on 03/31/20 @ [facility] placed.   Pt informed of HCG levels today and plan for follow-up.  Verbalizes an understanding.  Aware to continue to follow ectopic precautions.   Joaquim Lai [place] 11/25/20215:02 PM

## 2020-03-23 NOTE — MAU Note (Signed)
Pt reports that she is here for a follow up lab.

## 2020-03-23 NOTE — MAU Note (Signed)
Pt wanted to leave before lab resulted, provider made aware. Per provider pt may leave

## 2020-03-23 NOTE — Discharge Instructions (Signed)
Ectopic Pregnancy ° °An ectopic pregnancy is when the fertilized egg attaches (implants) outside the uterus. Most ectopic pregnancies occur in one of the tubes where eggs travel from the ovary to the uterus (fallopian tubes), but the implanting can occur in other locations. In rare cases, ectopic pregnancies occur on the ovary, intestine, pelvis, abdomen, or cervix. In an ectopic pregnancy, the fertilized egg does not have the ability to develop into a normal, healthy baby. °A ruptured ectopic pregnancy is one in which tearing or bursting of a fallopian tube causes internal bleeding. Often, there is intense lower abdominal pain, and vaginal bleeding sometimes occurs. Having an ectopic pregnancy can be life-threatening. If this dangerous condition is not treated, it can lead to blood loss, shock, or even death. °What are the causes? °The most common cause of this condition is damage to one of the fallopian tubes. A fallopian tube may be narrowed or blocked, and that keeps the fertilized egg from reaching the uterus. °What increases the risk? °This condition is more likely to develop in women of childbearing age who have different levels of risk. The levels of risk can be divided into three categories. °High risk °· You have gone through infertility treatment. °· You have had an ectopic pregnancy before. °· You have had surgery on the fallopian tubes, or another surgical procedure, such as an abortion. °· You have had surgery to have the fallopian tubes tied (tubal ligation). °· You have problems or diseases of the fallopian tubes. °· You have been exposed to diethylstilbestrol (DES). This medicine was used until 1971, and it had effects on babies whose mothers took the medicine. °· You become pregnant while using an IUD (intrauterine device) for birth control. °Moderate risk °· You have a history of infertility. °· You have had an STI (sexually transmitted infection). °· You have a history of pelvic inflammatory  disease (PID). °· You have scarring from endometriosis. °· You have multiple sexual partners. °· You smoke. °Low risk °· You have had pelvic surgery. °· You use vaginal douches. °· You became sexually active before age 18. °What are the signs or symptoms? °Common symptoms of this condition include normal pregnancy symptoms, such as missing a period, nausea, tiredness, abdominal pain, breast tenderness, and bleeding. However, ectopic pregnancy will have additional symptoms, such as: °· Pain with intercourse. °· Irregular vaginal bleeding or spotting. °· Cramping or pain on one side or in the lower abdomen. °· Fast heartbeat, low blood pressure, and sweating. °· Passing out while having a bowel movement. °Symptoms of a ruptured ectopic pregnancy and internal bleeding may include: °· Sudden, severe pain in the abdomen and pelvis. °· Dizziness, weakness, light-headedness, or fainting. °· Pain in the shoulder or neck area. °How is this diagnosed? °This condition is diagnosed by: °· A pelvic exam to locate pain or a mass in the abdomen. °· A pregnancy test. This blood test checks for the presence as well as the specific level of pregnancy hormone in the bloodstream. °· Ultrasound. This is performed if a pregnancy test is positive. In this test, a probe is inserted into the vagina. The probe will detect a fetus, possibly in a location other than the uterus. °· Taking a sample of uterus tissue (dilation and curettage, or D&C). °· Surgery to perform a visual exam of the inside of the abdomen using a thin, lighted tube that has a tiny camera on the end (laparoscope). °· Culdocentesis. This procedure involves inserting a needle at the top of   the vagina, behind the uterus. If blood is present in this area, it may indicate that a fallopian tube is torn. How is this treated? This condition is treated with medicine or surgery. Medicine  An injection of a medicine (methotrexate) may be given to cause the pregnancy tissue to be  absorbed. This medicine may save your fallopian tube. It may be given if: ? The diagnosis is made early, with no signs of active bleeding. ? The fallopian tube has not ruptured. ? You are considered to be a good candidate for the medicine. Usually, pregnancy hormone blood levels are checked after methotrexate treatment. This is to be sure that the medicine is effective. It may take 4-6 weeks for the pregnancy to be absorbed. Most pregnancies will be absorbed by 3 weeks. Surgery  A laparoscope may be used to remove the pregnancy tissue.  If severe internal bleeding occurs, a larger cut (incision) may be made in the lower abdomen (laparotomy) to remove the fetus and placenta. This is done to stop the bleeding.  Part or all of the fallopian tube may be removed (salpingectomy) along with the fetus and placenta. The fallopian tube may also be repaired during the surgery.  In very rare circumstances, removal of the uterus (hysterectomy) may be required.  After surgery, pregnancy hormone testing may be done to be sure that there is no pregnancy tissue left. Whether your treatment is medicine or surgery, you may receive a Rho (D) immune globulin shot to prevent problems with any future pregnancy. This shot may be given if:  You are Rh-negative and the baby's father is Rh-positive.  You are Rh-negative and you do not know the Rh type of the baby's father. Follow these instructions at home:  Rest and limit your activity after the procedure for as long as told by your health care provider.  Until your health care provider says that it is safe: ? Do not lift anything that is heavier than 10 lb (4.5 kg), or the limit that your health care provider tells you. ? Avoid physical exercise and any movement that requires effort (is strenuous).  To help prevent constipation: ? Eat a healthy diet that includes fruits, vegetables, and whole grains. ? Drink 6-8 glasses of water per day. Get help right away  if:  You develop worsening pain that is not relieved by medicine.  You have: ? A fever or chills. ? Vaginal bleeding. ? Redness and swelling at the incision site. ? Nausea and vomiting.  You feel dizzy or weak.  You feel light-headed or you faint. This information is not intended to replace advice given to you by your health care provider. Make sure you discuss any questions you have with your health care provider. Document Revised: 03/28/2017 Document Reviewed: 11/15/2015 Elsevier Patient Education  [place].

## 2020-03-26 ENCOUNTER — Inpatient Hospital Stay (HOSPITAL_COMMUNITY)
Admission: AD | Admit: 2020-03-26 | Discharge: 2020-03-26 | Disposition: A | Discharge status: Home / Self Care | Payer: Medicaid Other | Attending: Family Medicine | Admitting: Family Medicine

## 2020-03-26 ENCOUNTER — Other Ambulatory Visit: Payer: Self-pay

## 2020-03-26 ENCOUNTER — Inpatient Hospital Stay (HOSPITAL_COMMUNITY): Payer: Medicaid Other

## 2020-03-26 ENCOUNTER — Telehealth: Payer: Self-pay | Admitting: Certified Nurse Midwife

## 2020-03-26 DIAGNOSIS — Z9079 Acquired absence of other genital organ(s): Secondary | ICD-10-CM | POA: Insufficient documentation

## 2020-03-26 DIAGNOSIS — Z3A01 Less than 8 weeks gestation of pregnancy: Secondary | ICD-10-CM | POA: Insufficient documentation

## 2020-03-26 DIAGNOSIS — O26891 Other specified pregnancy related conditions, first trimester: Secondary | ICD-10-CM | POA: Insufficient documentation

## 2020-03-26 DIAGNOSIS — Z87891 Personal history of nicotine dependence: Secondary | ICD-10-CM | POA: Diagnosis not present

## 2020-03-26 DIAGNOSIS — R109 Unspecified abdominal pain: Secondary | ICD-10-CM | POA: Diagnosis not present

## 2020-03-26 DIAGNOSIS — Z79899 Other long term (current) drug therapy: Secondary | ICD-10-CM | POA: Insufficient documentation

## 2020-03-26 DIAGNOSIS — O3680X0 Pregnancy with inconclusive fetal viability, not applicable or unspecified: Secondary | ICD-10-CM | POA: Diagnosis not present

## 2020-03-26 LAB — WET PREP, GENITAL
Sperm: NONE SEEN
Trich, Wet Prep: NONE SEEN
Yeast Wet Prep HPF POC: NONE SEEN

## 2020-03-26 LAB — HCG, QUANTITATIVE, PREGNANCY: hCG, Beta Chain, Quant, S: 1251 m[IU]/mL — ABNORMAL HIGH (ref <5)

## 2020-03-26 NOTE — MAU Provider Note (Signed)
History     CSN: [id number]  Arrival date and time: 03/26/20 1353   First Provider Initiated Contact with Patient 03/26/20 1435      Chief Complaint  Patient presents with  . Follow-up   [name]  is a 29 y.o. [id number] at [redacted]w[redacted]d who presents to MAU today for follow-up quant hCG after 48 hours. The patient was seen in MAU on 03/21/20 and had quant hCG of 678 and US showed no IUP. She returned on 03/23/20 and qhcg was 820. She was to return yesterday for f/u but was unable d/t cold sx. She reports RLQ pain. Rates 4/10. Has not tried anything for it. Denies VB. Started having vaginal discharge with itching. No malodor.   OB History    Gravida  5   Para  1   Term  1   Preterm      AB  3   Living  1     SAB  2   TAB      Ectopic  1   Multiple      Live Births           Obstetric Comments  "ectopic" in 2017 was actually an ovarian cyst, removed d/t being complex.  Salpingectomy d/t hydrosalpinx.  Removed from history        Past Medical History:  Diagnosis Date  . Attention and concentration deficit   . Bipolar 1 disorder ([place])   . Dysmenorrhea   . Ectopic pregnancy   . Exposure to STD   . GERD (gastroesophageal reflux disease)   . Headache    Migraines  . 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 Ovarian cyst   . Pneumonia   . Sleep apnea    childhood, adenoidectomy  . Vaginal Pap smear, abnormal 2014    Past Surgical History:  Procedure Laterality Date  . DILATION AND CURETTAGE OF UTERUS    . LAPAROSCOPIC UNILATERAL SALPINGECTOMY Left 11/21/2015   Procedure: LAPAROSCOPIC UNILATERAL SALPINGECTOMY;  Surgeon: Woodroe Mode, MD;  Location: [facility];  Service: Gynecology;  Laterality: Left;  . LAPAROSCOPY N/A 11/21/2015   Procedure: LAPAROSCOPY DIAGNOSTIC;  Surgeon: Woodroe Mode, MD;  Location: [facility];  Service: Gynecology;  Laterality: N/A;  . NASAL SINUS SURGERY    .  TONSILECTOMY, ADENOIDECTOMY, BILATERAL MYRINGOTOMY AND TUBES    . TONSILLECTOMY    . TUBAL LIGATION      Family History  Problem Relation Age of Onset  . Heart disease Father   . Cancer Maternal Grandmother        breast  . Parkinsonism Maternal Grandfather   . Cancer Paternal Aunt        breast     Social History   Tobacco Use  . Smoking status: Former Smoker    Years: 3.00    Quit date: 10/23/2011    Years since quitting: 8.4  . Smokeless tobacco: Never Used  Substance Use Topics  . Alcohol use: No    Alcohol/week: 3.0 standard drinks    Types: 3 Standard drinks or equivalent per week    Comment: not with preg  . Drug use: No    Comment: hx of marijuana, not with preg    Allergies:  Allergies  Allergen Reactions  . Ceclor [Cefaclor] Hives  . Morphine And Related Itching  . Promethazine Nausea And Vomiting    Medications Prior to Admission  Medication  Sig Dispense Refill Last Dose  . albuterol (PROVENTIL HFA;VENTOLIN HFA) 108 (90 Base) MCG/ACT inhaler Inhale 2 puffs into the lungs every 6 (six) hours as needed for wheezing or shortness of breath. (Patient not taking: Reported on 10/20/2016) 1 Inhaler 2   . diphenhydrAMINE (BENADRYL) 25 MG tablet Take 25 mg by mouth at bedtime as needed for sleep.     . fluticasone (FLONASE) 50 MCG/ACT nasal spray Place 2 sprays into both nostrils daily. (Patient not taking: Reported on 10/20/2016) 16 g 2   . gabapentin (NEURONTIN) 100 MG capsule Take 100 mg by mouth daily.     Marland Kitchen omeprazole (PRILOSEC) 20 MG capsule Take 1 capsule (20 mg total) by mouth daily. 30 capsule 11     Review of Systems  Gastrointestinal: Positive for abdominal pain.  Genitourinary: Negative for vaginal bleeding.   Physical Exam   Blood pressure (!) 117/58, pulse 69, temperature 98.4 F (36.9 C), temperature source Oral, resp. rate 16, height 5\' 7"  (1.702 m), last menstrual period 02/26/2020, SpO2 100 %, unknown if currently breastfeeding.  Physical  Exam Constitutional:      General: She is not in acute distress. HENT:     Head: Normocephalic and atraumatic.  Cardiovascular:     Rate and Rhythm: Normal rate.  Pulmonary:     Effort: Pulmonary effort is normal. No respiratory distress.  Abdominal:     General: There is no distension.     Palpations: Abdomen is soft. There is no mass.     Tenderness: There is no abdominal tenderness. There is no guarding or rebound.  Musculoskeletal:        General: Normal range of motion.     Cervical back: Normal range of motion.  Skin:    General: Skin is warm and dry.  Neurological:     General: No focal deficit present.     Mental Status: She is alert and oriented to person, place, and time.  Psychiatric:        Mood and Affect: Mood normal.        Behavior: Behavior normal.    Results for orders placed or performed during the hospital encounter of 03/26/20 (from the past 24 hour(s))  hCG, quantitative, pregnancy     Status: Abnormal   Collection Time: 03/26/20  2:04 PM  Result Value Ref Range   hCG, Beta Chain, Quant, S 1,251 (H) <5 mIU/mL  Wet prep, genital     Status: Abnormal   Collection Time: 03/26/20  2:37 PM   Specimen: Vaginal  Result Value Ref Range   Yeast Wet Prep HPF POC NONE SEEN NONE SEEN   Trich, Wet Prep NONE SEEN NONE SEEN   Clue Cells Wet Prep HPF POC PRESENT (A) NONE SEEN   WBC, Wet Prep HPF POC MANY (A) NONE SEEN   Sperm NONE SEEN    US OB Transvaginal  Result Date: 03/26/2020 CLINICAL DATA:  Unknown location of pregnancy. EXAM: OBSTETRIC <14 WK [place] AND TRANSVAGINAL OB US TECHNIQUE: Both transabdominal and transvaginal ultrasound examinations were performed for complete evaluation of the gestation as well as the maternal uterus, adnexal regions, and pelvic cul-de-sac. Transvaginal technique was performed to assess early pregnancy. COMPARISON:  None. FINDINGS: Intrauterine gestational sac: Single Yolk sac:  Not Visualized. Embryo:  Not Visualized. Cardiac  Activity: Not Visualized. Heart Rate: N/A  bpm MSD: 4 mm   5 w   1 d Subchorionic hemorrhage:  Small Maternal uterus/adnexae: The right ovary measures 5.3 cm x 3.0  cm x 2.6 cm and contains multiple small anechoic structures. The left ovary measures 2.8 cm x 2.2 cm x 2.0 cm and is normal in appearance. A trace amount of pelvic free fluid is seen. IMPRESSION: Single intrauterine gestational sac, at approximately 5 weeks and 1 day gestation by ultrasound evaluation, without visualization of a yolk sac or fetal pole. While this may be secondary to early intrauterine pregnancy, correlation with follow-up pelvic ultrasound is recommended. Electronically Signed   By: Virgina Norfolk M.D.   On: 03/26/2020 15:23   MAU Course  Procedures  MDM Labs and [place] ordered and reviewed. IUGS seen but no YS or FP. Inappropriate rise in qhcg. Consult with Dr. Kennon Rounds, plan for rpt qhcg in 2 days and rpt [place] in 1 week. Stable for discharge home.  Assessment and Plan   1. Pregnancy, location unknown    Discharge home Follow up at [place] on 03/28/20 @10am  Follow up outpt [place] in 1 week-ordered SAB/ectopic precautions  Allergies as of 03/26/2020      Reactions   Ceclor [cefaclor] Hives   Morphine And Related Itching   Promethazine Nausea And Vomiting      Medication List    STOP taking these medications   gabapentin 100 MG capsule Commonly known as: NEURONTIN     TAKE these medications   albuterol 108 (90 Base) MCG/ACT inhaler Commonly known as: VENTOLIN HFA Inhale 2 puffs into the lungs every 6 (six) hours as needed for wheezing or shortness of breath.   diphenhydrAMINE 25 MG tablet Commonly known as: BENADRYL Take 25 mg by mouth at bedtime as needed for sleep.   fluticasone 50 MCG/ACT nasal spray Commonly known as: FLONASE Place 2 sprays into both nostrils daily.   omeprazole 20 MG capsule Commonly known as: PRILOSEC Take 1 capsule (20 mg total) by mouth daily.      Julianne Handler,  CNM 03/26/2020, 4:05 PM

## 2020-03-26 NOTE — MAU Note (Signed)
Danielle Evans is a 29 y.o. at [redacted]w[redacted]d here in MAU reporting: here for follow up hcg. States she is having increased pain. No bleeding.   Onset of complaint: ongoing  Pain score: 4/10  Vitals:   03/26/20 1417  BP: (!) 117/58  Pulse: 69  Resp: 16  Temp: 98.4 F (36.9 C)  SpO2: 100%     Lab orders placed from triage: hcg

## 2020-03-26 NOTE — Telephone Encounter (Signed)
Pt scheduled to return to MAU on 11/27 for f/u qhcg for PUL but did not show. Pt reports onset of cold sx and didn't want to expose anyone. Having some pain on RLQ. Recommend she come in today for stat qhcg, pt agrees and will come after arranging childcare.

## 2020-03-27 ENCOUNTER — Ambulatory Visit: Payer: Medicaid Other

## 2020-03-27 ENCOUNTER — Other Ambulatory Visit: Payer: Self-pay | Admitting: *Deleted

## 2020-03-27 DIAGNOSIS — O3680X0 Pregnancy with inconclusive fetal viability, not applicable or unspecified: Secondary | ICD-10-CM

## 2020-03-27 LAB — GC/CHLAMYDIA PROBE AMP (~~LOC~~) NOT AT ARMC
Chlamydia: NEGATIVE
Comment: NEGATIVE
Comment: NORMAL
Neisseria Gonorrhea: NEGATIVE

## 2020-03-27 NOTE — Progress Notes (Signed)
Patient scheduled for beta Hcg 03/28/2020. Nurse will not be in clinic. Future order placed.   Derl Barrow, RN

## 2020-03-28 ENCOUNTER — Telehealth: Payer: Self-pay | Admitting: General Practice

## 2020-03-28 ENCOUNTER — Other Ambulatory Visit: Payer: Self-pay

## 2020-03-28 ENCOUNTER — Ambulatory Visit (INDEPENDENT_AMBULATORY_CARE_PROVIDER_SITE_OTHER): Payer: Medicaid Other | Admitting: *Deleted

## 2020-03-28 ENCOUNTER — Encounter: Payer: Self-pay | Admitting: General Practice

## 2020-03-28 DIAGNOSIS — O3680X0 Pregnancy with inconclusive fetal viability, not applicable or unspecified: Secondary | ICD-10-CM

## 2020-03-28 LAB — BETA HCG QUANT (REF LAB): hCG Quant: 1155 m[IU]/mL

## 2020-03-28 NOTE — Telephone Encounter (Signed)
Left message on VM for patient to give our office a call back to schedule lab follow up.

## 2020-03-28 NOTE — Progress Notes (Addendum)
   [name] presents to [facility] for follow-up quant hCG blood draw today. She was seen in MAU for abdominal pain on 03/21/2020. Results will be back in approximately 2-4 hours. Valid contact number for patient confirmed. I will call the patient with results.   Results and patient history reviewed with Dr.Williams. Patient called and informed of plan for follow-up. Patient will need another lab draw on Friday. Patient has follow up ultrasound on 03/31/2020.   Beta Hcg levels: 03/21/20: 678 03/23/20: 820 03/26/20: 1251 03/28/20: 1155   Drianna Chandran, [place] 03/28/2020 2:31 PM

## 2020-03-31 ENCOUNTER — Inpatient Hospital Stay (HOSPITAL_COMMUNITY)
Admission: AD | Admit: 2020-03-31 | Discharge: 2020-03-31 | Disposition: A | Discharge status: Home / Self Care | Payer: Medicaid Other | Attending: Obstetrics & Gynecology | Admitting: Obstetrics & Gynecology

## 2020-03-31 ENCOUNTER — Inpatient Hospital Stay (HOSPITAL_COMMUNITY): Payer: Medicaid Other

## 2020-03-31 ENCOUNTER — Other Ambulatory Visit: Payer: Self-pay

## 2020-03-31 ENCOUNTER — Ambulatory Visit: Payer: Medicaid Other

## 2020-03-31 ENCOUNTER — Other Ambulatory Visit: Payer: Medicaid Other

## 2020-03-31 DIAGNOSIS — O99891 Other specified diseases and conditions complicating pregnancy: Secondary | ICD-10-CM

## 2020-03-31 DIAGNOSIS — R109 Unspecified abdominal pain: Secondary | ICD-10-CM | POA: Diagnosis not present

## 2020-03-31 DIAGNOSIS — O4691 Antepartum hemorrhage, unspecified, first trimester: Secondary | ICD-10-CM | POA: Diagnosis not present

## 2020-03-31 DIAGNOSIS — O3680X0 Pregnancy with inconclusive fetal viability, not applicable or unspecified: Secondary | ICD-10-CM | POA: Diagnosis not present

## 2020-03-31 DIAGNOSIS — Z3A01 Less than 8 weeks gestation of pregnancy: Secondary | ICD-10-CM | POA: Insufficient documentation

## 2020-03-31 LAB — CBC
HCT: 39 % (ref 36.0–46.0)
Hemoglobin: 13.5 g/dL (ref 12.0–15.0)
MCH: 30.7 pg (ref 26.0–34.0)
MCHC: 34.6 g/dL (ref 30.0–36.0)
MCV: 88.6 fL (ref 80.0–100.0)
Platelets: 261 10*3/uL (ref 150–400)
RBC: 4.4 MIL/uL (ref 3.87–5.11)
RDW: 12.4 % (ref 11.5–15.5)
WBC: 9.5 10*3/uL (ref 4.0–10.5)
nRBC: 0 % (ref 0.0–0.2)

## 2020-03-31 LAB — HCG, QUANTITATIVE, PREGNANCY: hCG, Beta Chain, Quant, S: 1800 m[IU]/mL — ABNORMAL HIGH (ref <5)

## 2020-03-31 NOTE — MAU Note (Signed)
Yesterday started having spotting and some cramping. Low back, rt side, kind of in the joint. Spoke with Sam, was told to come in have work up.

## 2020-03-31 NOTE — Discharge Instructions (Signed)
Human Chorionic Gonadotropin Test Why am I having this test? A human chorionic gonadotropin (hCG) test is done to determine whether you are pregnant. It can also be used:  To diagnose an abnormal pregnancy.  To determine whether you have had a failed pregnancy (miscarriage) or are at risk of one. What is being tested? This test checks the level of the human chorionic gonadotropin (hCG) hormone in the blood. This hormone is produced during pregnancy by the cells that form the placenta. The placenta is the organ that grows inside your womb (uterus) to nourish a developing baby. When you are pregnant, hCG can be detected in your blood or urine 7 to 8 days before your missed period. It continues to go up for the first 8-10 weeks of pregnancy. The presence of hCG in your blood can be measured with several different types of tests. You may have:  A urine test. ? Because this hormone is eliminated from your body by your kidneys, you may have a urine test to find out whether you are pregnant. A home pregnancy test detects whether there is hCG in your urine. ? A urine test only shows whether there is hCG in your urine. It does not measure how much.  A qualitative blood test. ? You may have this type of blood test to find out if you are pregnant. ? This blood test only shows whether there is hCG in your blood. It does not measure how much.  A quantitative blood test. ? This type of blood test measures the amount of hCG in your blood. ? You may have this test to:  Diagnose an abnormal pregnancy.  Check whether you have had a miscarriage.  Determine whether you are at risk of a miscarriage. What kind of sample is taken?     Two kinds of samples may be collected to test for the hCG hormone.  Blood. It is usually collected by inserting a needle into a blood vessel.  Urine. It is usually collected by urinating into a germ-free (sterile) specimen cup. It is best to collect the sample the first  time you urinate in the morning. How do I prepare for this test? No preparation is needed for a blood test.  For the urine test:  Let your health care provider know about: ? All medicines you are taking, including vitamins, herbs, creams, and over-the-counter medicines. ? Any blood in your urine. This may interfere with the result.  Do not drink too much fluid. Drink as you normally would, or as directed by your health care provider. How are the results reported? Depending on the type of test that you have, your test results may be reported as values. Your health care provider will compare your results to normal ranges that were established after testing a large group of people (reference ranges). Reference ranges may vary among labs and hospitals. For this test, common reference ranges that show absence of pregnancy are:  Quantitative hCG blood levels: less than 5 IU/L. Other results will be reported as either positive or negative. For this test, normal results (meaning the absence of pregnancy) are:  Negative for hCG in the urine test.  Negative for hCG in the qualitative blood test. What do the results mean? Urine and qualitative blood test  A negative result could mean: ? That you are not pregnant. ? That the test was done too early in your pregnancy to detect hCG in your blood or urine. If you still have other signs  of pregnancy, the test will be repeated.  A positive result means: ? That you are most likely pregnant. Your health care provider may confirm your pregnancy with an imaging study (ultrasound) of your uterus, if needed. Quantitative blood test Results of the quantitative hCG blood test will be interpreted as follows:  Less than 5 IU/L: You are most likely not pregnant.  Greater than 25 IU/L: You are most likely pregnant.  hCG levels that are higher than expected: ? You are pregnant with twins. ? You have abnormal growths in the uterus.  hCG levels that are  rising more slowly than expected: ? You have an ectopic pregnancy (also called a tubal pregnancy).  hCG levels that are falling: ? You may be having a miscarriage. Talk with your health care provider about what your results mean. Questions to ask your health care provider Ask your health care provider, or the department that is doing the test:  When will my results be ready?  How will I get my results?  What are my treatment options?  What other tests do I need?  What are my next steps? Summary  A human chorionic gonadotropin test is done to determine whether you are pregnant.  When you are pregnant, hCG can be detected in your blood or urine 7 to 8 days before your missed period. It continues to go up for the first 8-10 weeks of pregnancy.  Your hCG level can be measured with different types of tests. You may have a urine test, a qualitative blood test, or a quantitative blood test.  Talk with your health care provider about what your results mean. This information is not intended to replace advice given to you by your health care provider. Make sure you discuss any questions you have with your health care provider. Document Revised: 03/17/2017 Document Reviewed: 03/17/2017 Elsevier Patient Education  2020 [employer].

## 2020-03-31 NOTE — MAU Provider Note (Signed)
History     CSN: [id number]  Arrival date and time: 03/31/20 1116   First Provider Initiated Contact with Patient 03/31/20 1436      Chief Complaint  Patient presents with  . Abdominal Pain  . Vaginal Bleeding   HPI Danielle Evans is a 29 y.o. [id number] at [redacted]w[redacted]d with pregnancy of unknown location who presents to MAU with chief complaint of new onset abdominal cramping and vaginal spotting. She is s/p serial quant hCGs following MAU visits which began 03/21/2020.  Patient describes her spotting as mild, unpredictable. Her bleeding has not been heavy but is persistent.  OB History    Gravida  5   Para  1   Term  1   Preterm      AB  3   Living  1     SAB  2   TAB      Ectopic  1   Multiple      Live Births           Obstetric Comments  "ectopic" in 2017 was actually an ovarian cyst, removed d/t being complex.  Salpingectomy d/t hydrosalpinx.  Removed from history        Past Medical History:  Diagnosis Date  . Attention and concentration deficit   . Bipolar 1 disorder ([place])   . Dysmenorrhea   . Ectopic pregnancy   . Exposure to STD   . GERD (gastroesophageal reflux disease)   . Headache    Migraines  . 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 Ovarian cyst   . Pneumonia   . Sleep apnea    childhood, adenoidectomy  . Vaginal Pap smear, abnormal 2014    Past Surgical History:  Procedure Laterality Date  . DILATION AND CURETTAGE OF UTERUS    . LAPAROSCOPIC UNILATERAL SALPINGECTOMY Left 11/21/2015   Procedure: LAPAROSCOPIC UNILATERAL SALPINGECTOMY;  Surgeon: Woodroe Mode, MD;  Location: [facility];  Service: Gynecology;  Laterality: Left;  . LAPAROSCOPY N/A 11/21/2015   Procedure: LAPAROSCOPY DIAGNOSTIC;  Surgeon: Woodroe Mode, MD;  Location: [facility];  Service: Gynecology;  Laterality: N/A;  . NASAL SINUS SURGERY    . TONSILECTOMY, ADENOIDECTOMY, BILATERAL MYRINGOTOMY AND TUBES    .  TONSILLECTOMY    . TUBAL LIGATION      Family History  Problem Relation Age of Onset  . Heart disease Father   . Cancer Maternal Grandmother        breast  . Parkinsonism Maternal Grandfather   . Cancer Paternal Aunt        breast     Social History   Tobacco Use  . Smoking status: Former Smoker    Years: 3.00    Quit date: 10/23/2011    Years since quitting: 8.4  . Smokeless tobacco: Never Used  Substance Use Topics  . Alcohol use: No    Alcohol/week: 3.0 standard drinks    Types: 3 Standard drinks or equivalent per week    Comment: not with preg  . Drug use: No    Comment: hx of marijuana, not with preg    Allergies:  Allergies  Allergen Reactions  . Ceclor [Cefaclor] Hives  . Morphine And Related Itching  . Promethazine Nausea And Vomiting    No medications prior to admission.    Review of Systems  Gastrointestinal: Positive for abdominal pain.  Genitourinary: Positive for vaginal bleeding.  All other systems reviewed  and are negative.  Physical Exam   Blood pressure (!) 107/58, pulse 68, temperature 98.4 F (36.9 C), temperature source Oral, resp. rate 16, height 5\' 7"  (1.702 m), weight 86.9 kg, last menstrual period 02/26/2020, SpO2 99 %, unknown if currently breastfeeding.  Physical Exam Vitals and nursing note reviewed. Exam conducted with a chaperone present.  Constitutional:      Appearance: She is well-developed.  Cardiovascular:     Rate and Rhythm: Normal rate.  Pulmonary:     Effort: Pulmonary effort is normal.  Skin:    General: Skin is warm and dry.     Capillary Refill: Capillary refill takes less than 2 seconds.  Neurological:     General: No focal deficit present.     Mental Status: She is alert.  Psychiatric:        Mood and Affect: Mood is anxious.        Behavior: Behavior normal.     MAU Course  Procedures   Quant hCG trend 678 on 03/21/2020 820 on 03/23/2020 1251 on 03/26/2020 1155 on 03/28/2020 1800  today  Patient states she experienced a very slow rise in [place] with her previous pregnancy  Patient Vitals for the past 24 hrs:  BP Temp Temp src Pulse Resp SpO2 Height Weight  03/31/20 1207 (!) 107/58 98.4 F (36.9 C) Oral 68 16 99 % 5\' 7"  (1.702 m) 86.9 kg   Results for orders placed or performed during the hospital encounter of 03/31/20 (from the past 24 hour(s))  CBC     Status: None   Collection Time: 03/31/20 11:44 AM  Result Value Ref Range   WBC 9.5 4.0 - 10.5 K/uL   RBC 4.40 3.87 - 5.11 MIL/uL   Hemoglobin 13.5 12.0 - 15.0 g/dL   HCT 39.0 36 - 46 %   MCV 88.6 80.0 - 100.0 fL   MCH 30.7 26.0 - 34.0 pg   MCHC 34.6 30.0 - 36.0 g/dL   RDW 12.4 11.5 - 15.5 %   Platelets 261 150 - 400 K/uL   nRBC 0.0 0.0 - 0.2 %  hCG, quantitative, pregnancy     Status: Abnormal   Collection Time: 03/31/20 11:44 AM  Result Value Ref Range   hCG, Beta Chain, Quant, S 1,800 (H) <5 mIU/mL   US OB Transvaginal  Result Date: 03/31/2020 CLINICAL DATA:  Pregnancy of unknown anatomic location EXAM: TRANSVAGINAL OB ULTRASOUND TECHNIQUE: Transvaginal ultrasound was performed for complete evaluation of the gestation as well as the maternal uterus, adnexal regions, and pelvic cul-de-sac. COMPARISON:  03/26/2020 FINDINGS: Intrauterine gestational sac: Present, single Yolk sac:  Not identified Embryo:  Not identified Cardiac Activity: N/A Heart Rate: N/A bpm MSD: 4.4 mm   5 w   1 d Subchorionic hemorrhage:  Small subchronic hemorrhage Maternal uterus/adnexae: Uterus anteverted, otherwise unremarkable. LEFT ovary normal size and morphology, 1.8 x 1.7 x 1.6 cm. RIGHT ovary normal size at 3.0 x 4.0 x 2.0 cm containing small corpus luteal cyst. Trace free pelvic fluid. No adnexal masses. IMPRESSION: Tiny intrauterine gestational sac at fundus corresponding to 5 weeks 1 day EGA by mean sac diameter. No fetal pole identified to establish viability; may consider follow-up ultrasound in 14 days if clinically indicated to  establish viability. Electronically Signed   By: Lavonia Dana M.D.   On: 03/31/2020 14:27   Assessment and Plan  --29 y.o. [id number] with pregnancy of unknown location --S/p consult with Dr. Roselie Awkward, who advises repeat Quant hCG in 24 hours --Discussed  fluctuating Quant hCG and ongoing concern for ectopic vs early pregnancy --Discharge home in stable condition with ectopic precautions  Darlina Rumpf, CNM 03/31/2020, 10:18 PM

## 2020-04-02 ENCOUNTER — Other Ambulatory Visit: Payer: Self-pay

## 2020-04-02 ENCOUNTER — Inpatient Hospital Stay (HOSPITAL_COMMUNITY)
Admission: AD | Admit: 2020-04-02 | Discharge: 2020-04-02 | Disposition: A | Discharge status: Home / Self Care | Payer: Medicaid Other | Attending: Obstetrics and Gynecology | Admitting: Obstetrics and Gynecology

## 2020-04-02 DIAGNOSIS — O0281 Inappropriate change in quantitative human chorionic gonadotropin (hCG) in early pregnancy: Secondary | ICD-10-CM

## 2020-04-02 DIAGNOSIS — O3680X0 Pregnancy with inconclusive fetal viability, not applicable or unspecified: Secondary | ICD-10-CM | POA: Diagnosis present

## 2020-04-02 DIAGNOSIS — Z3A01 Less than 8 weeks gestation of pregnancy: Secondary | ICD-10-CM | POA: Diagnosis not present

## 2020-04-02 LAB — HCG, QUANTITATIVE, PREGNANCY: hCG, Beta Chain, Quant, S: 2130 m[IU]/mL — ABNORMAL HIGH (ref <5)

## 2020-04-02 NOTE — MAU Provider Note (Signed)
[name]  is a 29 y.o. [phone] at [redacted]w[redacted]d who presents to the [facility] today for follow-up quant hCG after 48 hours.   Initial visit on 03/21/20 for pain and bleeding.  Has a history of one ectopic pregnancy  HCG levels have been:  11/23  678           11/25  820           11/28  1251   GS 5 1/7 wks           11/30  1155           12/3    1800   GS 5/1/7 wks  Ultrasound Findings: 11/28  1251   GS 5 1/7 wks 12/3    1800   GS 5/1/7 wks  She denies pain Denies vaginal bleeding  Denies fever    OB History  Gravida Para Term Preterm AB Living  5 1 1   3 1   SAB TAB Ectopic Multiple Live Births  2   1        # Outcome Date GA Lbr Len/2nd Weight Sex Delivery Anes PTL Lv  5 Current           4 Term 03/30/17 [redacted]w[redacted]d    Vag-Spont     3 SAB 02/2015 [redacted]w[redacted]d         2 Ectopic 2015 [redacted]w[redacted]d         1 SAB 2011 [redacted]w[redacted]d           Obstetric Comments  "ectopic" in 2017 was actually an ovarian cyst, removed d/t being complex.  Salpingectomy d/t hydrosalpinx.  Removed from history    Past Medical History:  Diagnosis Date  . Attention and concentration deficit   . Bipolar 1 disorder ([place])   . Dysmenorrhea   . Ectopic pregnancy   . Exposure to STD   . GERD (gastroesophageal reflux disease)   . Headache    Migraines  . 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 Ovarian cyst   . Pneumonia   . Sleep apnea    childhood, adenoidectomy  . Vaginal Pap smear, abnormal 2014     BP 125/81 (BP Location: Right Arm)   Pulse 83   Temp 98.1 F (36.7 C) (Oral)   Resp 17   LMP 02/26/2020 (Exact Date)   SpO2 99% Comment: room air  CONSTITUTIONAL: Well-developed, well-nourished female in no acute distress.  MUSCULOSKELETAL: Normal range of motion.  CARDIOVASCULAR: Regular heart rate RESPIRATORY: Normal effort NEUROLOGICAL: Alert and oriented to person, place, and time.  SKIN: Skin is warm and dry. No rash noted. Not  diaphoretic. No erythema. No pallor. PSYCH: Normal mood and affect. Normal behavior. Normal judgment and thought content.  Results for orders placed or performed during the hospital encounter of 04/02/20 (from the past 24 hour(s))  hCG, quantitative, pregnancy     Status: Abnormal   Collection Time: 04/02/20  3:08 PM  Result Value Ref Range   hCG, Beta Chain, Quant, S 2,130 (H) <5 mIU/mL      A: Pregnancy at [redacted]w[redacted]d weeks per LMP / [place] Inappropriate less than 50% rise in quant hCG after 48 hours Pain resolved  P: Discharge home First trimester/ectopic precautions discussed Discussed with Dr Rip Harbour. He states that repeating HCG levels will not be definitive of outcome. He recommends waiting until we repeat US THurs or Friday.  Discussed  with patient who is hopeful still  Patient will return for follow-up US at [facility] in 1 week. Order placed Patient will return to [facility] for results following [place].  Patient may return to MAU as needed or if her condition were to change or worsen   Kennis Carina 04/02/2020 3:17 PM

## 2020-04-02 NOTE — Discharge Instructions (Signed)
Threatened Miscarriage  A threatened miscarriage occurs when a woman has vaginal bleeding during the first 20 weeks of pregnancy but the pregnancy has not ended. If you have vaginal bleeding during this time, your health care provider will do tests to make sure you are still pregnant. If the tests show that you are still pregnant and that the developing baby (fetus) inside your uterus is still growing, your condition is considered a threatened miscarriage. A threatened miscarriage does not mean your pregnancy will end, but it does increase the risk of losing your pregnancy (complete miscarriage). What are the causes? The cause of this condition is usually not known. For women who go on to have a complete miscarriage, the most common cause is an abnormal number of chromosomes in the developing baby. Chromosomes are the structures inside cells that hold all of a person's genetic material. What increases the risk? The following lifestyle factors may increase your risk of a miscarriage in early pregnancy:  Smoking.  Drinking excessive amounts of alcohol or caffeine.  Recreational drug use. The following preexisting health conditions may increase your risk of a miscarriage in early pregnancy:  Polycystic ovary syndrome.  Uterine fibroids.  Infections.  Diabetes mellitus. What are the signs or symptoms? Symptoms of this condition include:  Vaginal bleeding.  Mild abdominal pain or cramps. How is this diagnosed? If you have bleeding with or without abdominal pain before 20 weeks of pregnancy, your health care provider will do tests to check whether you are still pregnant. These will include:  Ultrasound. This test uses sound waves to create images of the inside of your uterus. This allows your health care provider to look at your developing baby and other structures, such as your placenta.  Pelvic exam. This is an internal exam of your vagina and cervix.  Measurement of your baby's heart  rate.  Laboratory tests such as blood tests, urine tests, or swabs for infection You may be diagnosed with a threatened miscarriage if:  Ultrasound testing shows that you are still pregnant.  Your baby's heart rate is strong.  A pelvic exam shows that the opening between your uterus and your vagina (cervix) is closed.  Blood tests confirm that you are still pregnant. How is this treated? No treatments have been shown to prevent a threatened miscarriage from going on to a complete miscarriage. However, the right home care is important. Follow these instructions at home:  Get plenty of rest.  Do not have sex or use tampons if you have vaginal bleeding.  Do not douche.  Do not smoke or use recreational drugs.  Do not drink alcohol.  Avoid caffeine.  Keep all follow-up prenatal visits as told by your health care provider. This is important. Contact a health care provider if:  You have light vaginal bleeding or spotting while pregnant.  You have abdominal pain or cramping.  You have a fever. Get help right away if:  You have heavy vaginal bleeding.  You have blood clots coming from your vagina.  You pass tissue from your vagina.  You leak fluid, or you have a gush of fluid from your vagina.  You have severe low back pain or abdominal cramps.  You have fever, chills, and severe abdominal pain. Summary  A threatened miscarriage occurs when a woman has vaginal bleeding during the first 20 weeks of pregnancy but the pregnancy has not ended.  The cause of a threatened miscarriage is usually not known.  Symptoms of this condition may   include vaginal bleeding and mild abdominal pain or cramps.  No treatments have been shown to prevent a threatened miscarriage from going on to a complete miscarriage.  Keep all follow-up prenatal visits as told by your health care provider. This is important. This information is not intended to replace advice given to you by your health  care provider. Make sure you discuss any questions you have with your health care provider. Document Revised: 05/22/2017 Document Reviewed: 07/12/2016 Elsevier Patient Education  2020 Elsevier Inc.  

## 2020-04-02 NOTE — MAU Note (Signed)
Danielle Evans is a 29 y.o. at [redacted]w[redacted]d here in MAU reporting: here for follow up hcg. Denies pain and bleeding.  Pain score: 0/10  Vitals:   04/02/20 1458  BP: 125/81  Pulse: 83  Resp: 17  Temp: 98.1 F (36.7 C)  SpO2: 99%     Lab orders placed from triage: hcg

## 2020-04-06 ENCOUNTER — Other Ambulatory Visit: Payer: Self-pay

## 2020-04-06 ENCOUNTER — Ambulatory Visit
Admission: RE | Admit: 2020-04-06 | Discharge: 2020-04-06 | Disposition: A | Discharge status: Home / Self Care | Payer: Medicaid Other | Source: Ambulatory Visit | Attending: Advanced Practice Midwife | Admitting: Advanced Practice Midwife

## 2020-04-06 ENCOUNTER — Telehealth: Payer: Self-pay | Admitting: Medical

## 2020-04-06 DIAGNOSIS — O3680X0 Pregnancy with inconclusive fetal viability, not applicable or unspecified: Secondary | ICD-10-CM | POA: Diagnosis present

## 2020-04-06 NOTE — Telephone Encounter (Signed)
I called Danielle Evans today at 12:07 PM and confirmed patient's identity using two patient identifiers. [place] results from earlier today were reviewed. Patient has been seen multiple times in this pregnancy for evaluation as followed:   hCG  11/23  678           11/25  820           11/28  1251   - [place] GS 5 1/7 wks           11/30  1155           12/3    1800   GS 5/1/7 wks  12/5   2130  US OB Transvaginal  Result Date: 04/06/2020 CLINICAL DATA:  Pregnancy of unknown location EXAM: TRANSVAGINAL OB ULTRASOUND TECHNIQUE: Transvaginal ultrasound was performed for complete evaluation of the gestation as well as the maternal uterus, adnexal regions, and pelvic cul-de-sac. COMPARISON:  Six days ago FINDINGS: Intrauterine gestational sac: Single Yolk sac:  Visualized. Embryo:  Not Visualized. MSD: 6.1 mm   5 w   2 d Subchorionic hemorrhage:  None visualized. Maternal uterus/adnexae: Corpus luteum on the right. No pelvic fluid or adnexal mass IMPRESSION: Single intrauterine gestational sac with yolk sac. Gestational age is 5 weeks 2 days by mean sac diameter. An embryo is not yet visible. Electronically Signed   By: Monte Fantasia M.D.   On: 04/06/2020 09:37   Discussed with Dr. Elly Modena. Agrees that this does not appear to be a normal progressing pregnancy and that patient can be offered expectant management or Cytotec.   Discussed concerns with patient about lack of growth of the IUGS and inappropriately rising hCG levels. Advised that YS was noted today. Patient plans to discuss with SO and is likely to opt for EM at this time given the development of the YS. First trimester warning signs including severe pain, heavy bleeding and fever discussed as reasons to seek emergency care. Patient voiced understanding and had no further questions. Advised that she can return call to me directly if she has further questions after discussing with SO.   Kerry Hough, PA-C 04/06/2020 12:07 PM

## 2020-04-19 ENCOUNTER — Other Ambulatory Visit: Payer: Self-pay

## 2020-04-19 ENCOUNTER — Encounter: Payer: Self-pay | Admitting: Advanced Practice Midwife

## 2020-04-19 ENCOUNTER — Ambulatory Visit (INDEPENDENT_AMBULATORY_CARE_PROVIDER_SITE_OTHER): Payer: Medicaid Other | Admitting: Advanced Practice Midwife

## 2020-04-19 VITALS — BP 111/68 | HR 64 | Wt 193.0 lb

## 2020-04-19 DIAGNOSIS — O039 Complete or unspecified spontaneous abortion without complication: Secondary | ICD-10-CM

## 2020-04-19 NOTE — Patient Instructions (Signed)
Managing Pregnancy Loss Pregnancy loss can happen any time during a pregnancy. Often the cause is not known. It is rarely because of anything you did. Pregnancy loss in early pregnancy (during the first trimester) is called a miscarriage. This type of pregnancy loss is the most common. Pregnancy loss that happens after 20 weeks of pregnancy is called fetal demise if the baby's heart stops beating before birth. Fetal demise is much less common. Some women experience spontaneous labor shortly after fetal demise resulting in a stillborn birth (stillbirth). Any pregnancy loss can be devastating. You will need to recover both physically and emotionally. Most women are able to get pregnant again after a pregnancy loss and deliver a healthy baby. How to manage emotional recovery  Pregnancy loss is very hard emotionally. You may feel many different emotions while you grieve. You may feel sad and angry. You may also feel guilty. It is normal to have periods of crying. Emotional recovery can take longer than physical recovery. It is different for everyone. Taking these steps can help you in managing this loss:  Remember that it is unlikely you did anything to cause the pregnancy loss.  Share your thoughts and feelings with friends, family, and your partner. Remember that your partner is also recovering emotionally.  Make sure you have a good support system. Do not spend too much time alone.  Meet with a pregnancy loss counselor or join a pregnancy loss support group.  Get enough sleep and eat a healthy diet. Return to regular exercise when you have recovered physically.  Do not use drugs or alcohol to manage your emotions.  Consider seeing a mental health professional to help you recover emotionally.  Ask a friend or loved one to help you decide what to do with any clothing and nursery items you received for your baby. In the case of a stillbirth, many women benefit from taking additional steps in the  grieving process. You may want to:  Hold your baby after the birth.  Name your baby.  Request a birth certificate.  Create a keepsake such as handprints or footprints.  Dress your baby and have a picture taken.  Make funeral arrangements.  Ask for a baptism or blessing. Hospitals have staff members who can help you with all these arrangements. How to recognize emotional stress It is normal to have emotional stress after a pregnancy loss. But emotional stress that lasts a long time or becomes severe requires treatment. Watch out for these signs of severe emotional stress:  Sadness, anger, or guilt that is not going away and is interfering with your normal activities.  Relationship problems that have occurred or gotten worse since the pregnancy loss.  Signs of depression that last longer than 2 weeks. These may include: ? Sadness. ? Anxiety. ? Hopelessness. ? Loss of interest in activities you enjoy. ? Inability to concentrate. ? Trouble sleeping or sleeping too much. ? Loss of appetite or overeating. ? Thoughts of death or of hurting yourself. Follow these instructions at home:  Take over-the-counter and prescription medicines only as told by your health care provider.  Rest at home until your energy level returns. Return to your normal activities as told by your health care provider. Ask your health care provider what activities are safe for you.  When you are ready, meet with your health care provider to discuss steps to take for a future pregnancy.  Keep all follow-up visits as told by your health care provider. This is important.  Where to find support  To help you and your partner with the process of grieving, talk with your health care provider or seek counseling.  Consider meeting with others who have experienced pregnancy loss. Ask your health care provider about support groups and resources. Where to find more information  [place] Department of Health and Human  Services Office on Women's Health: www.womenshealth.gov  American Pregnancy Association: www.americanpregnancy.org Contact a health care provider if:  You continue to experience grief, sadness, or lack of motivation for everyday activities, and those feelings do not improve over time.  You are struggling to recover emotionally, especially if you are using alcohol or substances to help. Get help right away if:  You have thoughts of hurting yourself or others. If you ever feel like you may hurt yourself or others, or have thoughts about taking your own life, get help right away. You can go to your nearest emergency department or call:  Your local emergency services (911 in the [place]  A suicide crisis helpline, such as the National Suicide Prevention Lifeline at 1-800-273-8255. This is open 24 hours a day. Summary  Any pregnancy loss can be difficult physically and emotionally.  You may experience many different emotions while you grieve. Emotional recovery can last longer than physical recovery.  It is normal to have emotional stress after a pregnancy loss. But emotional stress that lasts a long time or becomes severe requires treatment.  See your health care provider if you are struggling emotionally after a pregnancy loss. This information is not intended to replace advice given to you by your health care provider. Make sure you discuss any questions you have with your health care provider. Document Revised: 08/05/2018 Document Reviewed: 06/26/2017 Elsevier Patient Education  2020 Elsevier Inc.  
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
## 2020-04-20 LAB — CBC
Hematocrit: 39.9 % (ref 34.0–46.6)
Hemoglobin: 13.2 g/dL (ref 11.1–15.9)
MCH: 29.7 pg (ref 26.6–33.0)
MCHC: 33.1 g/dL (ref 31.5–35.7)
MCV: 90 fL (ref 79–97)
Platelets: 344 10*3/uL (ref 150–450)
RBC: 4.45 x10E6/uL (ref 3.77–5.28)
RDW: 12 % (ref 11.7–15.4)
WBC: 7.6 10*3/uL (ref 3.4–10.8)

## 2020-04-20 LAB — BETA HCG QUANT (REF LAB): hCG Quant: 13 m[IU]/mL

## 2020-07-03 ENCOUNTER — Telehealth: Payer: BC Managed Care – PPO | Admitting: Physician Assistant

## 2020-07-03 DIAGNOSIS — Z20818 Contact with and (suspected) exposure to other bacterial communicable diseases: Secondary | ICD-10-CM

## 2020-07-03 DIAGNOSIS — J029 Acute pharyngitis, unspecified: Secondary | ICD-10-CM

## 2020-07-03 MED ORDER — AZITHROMYCIN 250 MG PO TABS: ORAL_TABLET | ORAL | 0 refills | Status: DC

## 2020-07-03 NOTE — Progress Notes (Signed)
Message sent to patient requesting further input regarding current symptoms. Awaiting patient response.  

## 2020-07-03 NOTE — Progress Notes (Signed)

## 2021-04-29 NOTE — L&D Delivery Note (Signed)
Delivery Note At 7:56 PM a viable and healthy female was delivered via Vaginal, Spontaneous (Presentation:  LOA   ).  APGAR: 8, 9; weight pending.   Placenta status: Spontaneous, Intact.  Cord: 3 vessels with the following complications: None.    Anesthesia: Epidural Episiotomy: None Lacerations:  very superficial left labial tear, hemostatic, not repaired Suture Repair:  n/a Est. Blood Loss (mL):  50  Mom to postpartum.  Baby to Couplet care / Skin to Skin.  Danielle Evans is a 31 y.o. female [phone] with IUP at 44w4dadmitted for active labor.  She progressed without augmentation to complete and pushed ~ 10 minutes to deliver.  Cord clamping delayed by 1-3 minutes then clamped by CNM and cut by pt husband.  Placenta intact and spontaneous, bleeding minimal.  Very superficial left labial laceration, hemostatic, not repaired.  Mom and baby stable prior to transfer to postpartum. She plans on breastfeeding. She requests IUD for birth control.   LFatima Blank10/08/2021, 8:33 PM

## 2021-06-11 ENCOUNTER — Inpatient Hospital Stay (HOSPITAL_COMMUNITY): Payer: Medicaid Other

## 2021-06-11 ENCOUNTER — Encounter (HOSPITAL_COMMUNITY): Payer: Self-pay | Admitting: Family Medicine

## 2021-06-11 ENCOUNTER — Inpatient Hospital Stay (HOSPITAL_COMMUNITY)
Admission: AD | Admit: 2021-06-11 | Discharge: 2021-06-11 | Disposition: A | Discharge status: Home / Self Care | Payer: Medicaid Other | Attending: Family Medicine | Admitting: Family Medicine

## 2021-06-11 ENCOUNTER — Other Ambulatory Visit: Payer: Self-pay

## 2021-06-11 DIAGNOSIS — Z3A01 Less than 8 weeks gestation of pregnancy: Secondary | ICD-10-CM | POA: Insufficient documentation

## 2021-06-11 DIAGNOSIS — O26891 Other specified pregnancy related conditions, first trimester: Secondary | ICD-10-CM | POA: Diagnosis not present

## 2021-06-11 DIAGNOSIS — R102 Pelvic and perineal pain: Secondary | ICD-10-CM | POA: Insufficient documentation

## 2021-06-11 DIAGNOSIS — Z6710 Type A blood, Rh positive: Secondary | ICD-10-CM

## 2021-06-11 DIAGNOSIS — O3680X0 Pregnancy with inconclusive fetal viability, not applicable or unspecified: Secondary | ICD-10-CM | POA: Diagnosis not present

## 2021-06-11 LAB — WET PREP, GENITAL
Clue Cells Wet Prep HPF POC: NONE SEEN
Trich, Wet Prep: NONE SEEN
WBC, Wet Prep HPF POC: <10 (ref <10)
Yeast Wet Prep HPF POC: NONE SEEN

## 2021-06-11 LAB — URINALYSIS, ROUTINE W REFLEX MICROSCOPIC
Bilirubin Urine: NEGATIVE
Glucose, UA: NEGATIVE mg/dL
Hgb urine dipstick: NEGATIVE
Ketones, ur: NEGATIVE mg/dL
Nitrite: NEGATIVE
Protein, ur: 30 mg/dL — AB
Specific Gravity, Urine: 1.03 (ref 1.005–1.030)
pH: 5 (ref 5.0–8.0)

## 2021-06-11 LAB — CBC
HCT: 37.8 % (ref 36.0–46.0)
Hemoglobin: 12.5 g/dL (ref 12.0–15.0)
MCH: 29.2 pg (ref 26.0–34.0)
MCHC: 33.1 g/dL (ref 30.0–36.0)
MCV: 88.3 fL (ref 80.0–100.0)
Platelets: 243 10*3/uL (ref 150–400)
RBC: 4.28 MIL/uL (ref 3.87–5.11)
RDW: 12.9 % (ref 11.5–15.5)
WBC: 4.9 10*3/uL (ref 4.0–10.5)
nRBC: 0 % (ref 0.0–0.2)

## 2021-06-11 LAB — POCT PREGNANCY, URINE: Preg Test, Ur: POSITIVE — AB

## 2021-06-11 LAB — HCG, QUANTITATIVE, PREGNANCY: hCG, Beta Chain, Quant, S: 3082 m[IU]/mL — ABNORMAL HIGH (ref <5)

## 2021-06-11 MED ORDER — ONDANSETRON HCL 4 MG PO TABS: 4.0000 mg | ORAL_TABLET | Freq: Three times a day (TID) | ORAL | 0 refills | Status: DC | PRN

## 2021-06-11 NOTE — MAU Note (Signed)
Danielle Evans is a 31 y.o. at [redacted]w[redacted]d here in MAU reporting: is here for pregnancy confirmation, had 2 positive tests at home. Been having some cramping and sharp abdominal pain. Has seen some mild spotting that has been brown.   LMP: 04/19/21 approximate, had some spotting prior  Onset of complaint: ongoing  Pain score: 3/10  Vitals:   06/11/21 1049  BP: 110/70  Pulse: 78  Resp: 16  Temp: 98.3 F (36.8 C)  SpO2: 99%     Lab orders placed from triage: ua, upt

## 2021-06-11 NOTE — MAU Provider Note (Addendum)
History     CSN: [id number]  Arrival date and time: 06/11/21 1021   Event Date/Time   First Provider Initiated Contact with Patient 06/11/21 1108      Chief Complaint  Patient presents with   Abdominal Pain   Vaginal Bleeding   Danielle Evans is a 31 y.o. [id number] at [redacted]w[redacted]d who presents today with lower back pain and abdominal cramping. She also had some brown spotting, but that seems to  have mostly resolved at this time.    Vaginal Bleeding The patient's primary symptoms include pelvic pain and vaginal bleeding. This is a new problem. The current episode started yesterday. The problem occurs constantly. The problem has been unchanged. The pain is mild. The problem affects both sides. She is pregnant. Associated symptoms include back pain. The vaginal discharge was brown. The vaginal bleeding is spotting. She has not been passing clots. She has not been passing tissue. Nothing aggravates the symptoms. She has tried acetaminophen for the symptoms. The treatment provided moderate relief.   OB History     Gravida  6   Para  1   Term  1   Preterm      AB  4   Living  1      SAB  3   IAB      Ectopic  1   Multiple      Live Births           Obstetric Comments  "ectopic" in 2017 was actually an ovarian cyst, removed d/t being complex.  Salpingectomy d/t hydrosalpinx.  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[place])    Ovarian cyst    Pneumonia    Sleep apnea    childhood, adenoidectomy   Vaginal Pap smear, abnormal 2014    Past Surgical History:  Procedure Laterality Date   DILATION AND CURETTAGE OF UTERUS     LAPAROSCOPIC UNILATERAL  SALPINGECTOMY Left 11/21/2015   Procedure: LAPAROSCOPIC UNILATERAL SALPINGECTOMY;  Surgeon: Woodroe Mode, MD;  Location: [facility];  Service: Gynecology;  Laterality: Left;   LAPAROSCOPY N/A 11/21/2015   Procedure: LAPAROSCOPY DIAGNOSTIC;  Surgeon: Woodroe Mode, MD;  Location: [facility];  Service: Gynecology;  Laterality: N/A;   NASAL SINUS SURGERY     TONSILECTOMY, ADENOIDECTOMY, BILATERAL MYRINGOTOMY AND TUBES     TONSILLECTOMY     TUBAL LIGATION      Family History  Problem Relation Age of Onset   Heart disease Father    Cancer Maternal Grandmother        breast   Parkinsonism Maternal Grandfather    Cancer Paternal Aunt        breast     Social History   Tobacco Use   Smoking status: Former    Years: 3.00    Types: Cigarettes    Quit date: 10/23/2011    Years since quitting: 9.6   Smokeless tobacco: Never  Substance Use Topics   Alcohol use: No    Alcohol/week: 3.0 standard drinks    Types: 3 Standard drinks or equivalent per week  Comment: not with preg   Drug use: No    Comment: hx of marijuana, not with preg    Allergies:  Allergies  Allergen Reactions   Ceclor [Cefaclor] Hives   Morphine And Related Itching   Promethazine Nausea And Vomiting    Medications Prior to Admission  Medication Sig Dispense Refill Last Dose   albuterol (PROVENTIL HFA;VENTOLIN HFA) 108 (90 Base) MCG/ACT inhaler Inhale 2 puffs into the lungs every 6 (six) hours as needed for wheezing or shortness of breath. (Patient not taking: Reported on 10/20/2016) 1 Inhaler 2    azithromycin (ZITHROMAX) 250 MG tablet Take 500 mg once, then 250 mg for four days 6 tablet 0     Review of Systems  Genitourinary:  Positive for pelvic pain.  Musculoskeletal:  Positive for back pain.  All other systems reviewed and are negative. Physical Exam   Blood pressure 110/70, pulse 78, temperature 98.3 F (36.8 C), temperature source Oral, resp. rate 16, height 5\' 7"  (1.702 m), weight 93.6 kg, last menstrual  period 04/19/2021, SpO2 99 %, unknown if currently breastfeeding.  Physical Exam Constitutional:      Appearance: She is well-developed.  HENT:     Head: Normocephalic.  Eyes:     Pupils: Pupils are equal, round, and reactive to light.  Cardiovascular:     Rate and Rhythm: Normal rate and regular rhythm.     Heart sounds: Normal heart sounds.  Pulmonary:     Effort: Pulmonary effort is normal. No respiratory distress.     Breath sounds: Normal breath sounds.  Abdominal:     Palpations: Abdomen is soft.     Tenderness: There is no abdominal tenderness.  Genitourinary:    Vagina: No bleeding. Vaginal discharge: mucusy.    Comments: External: no lesion Vagina: small amount of white discharge     Musculoskeletal:        General: Normal range of motion.     Cervical back: Normal range of motion and neck supple.  Skin:    General: Skin is warm and dry.  Neurological:     Mental Status: She is alert and oriented to person, place, and time.  Psychiatric:        Mood and Affect: Mood normal.        Behavior: Behavior normal.    Results for orders placed or performed during the hospital encounter of 06/11/21 (from the past 24 hour(s))  Pregnancy, urine POC     Status: Abnormal   Collection Time: 06/11/21 10:43 AM  Result Value Ref Range   Preg Test, Ur POSITIVE (A) NEGATIVE  Urinalysis, Routine w reflex microscopic Urine, Clean Catch     Status: Abnormal   Collection Time: 06/11/21 10:45 AM  Result Value Ref Range   Color, Urine AMBER (A) YELLOW   APPearance HAZY (A) CLEAR   Specific Gravity, Urine 1.030 1.005 - 1.030   pH 5.0 5.0 - 8.0   Glucose, UA NEGATIVE NEGATIVE mg/dL   Hgb urine dipstick NEGATIVE NEGATIVE   Bilirubin Urine NEGATIVE NEGATIVE   Ketones, ur NEGATIVE NEGATIVE mg/dL   Protein, ur 30 (A) NEGATIVE mg/dL   Nitrite NEGATIVE NEGATIVE   Leukocytes,Ua MODERATE (A) NEGATIVE   RBC / HPF 0-5 0 - 5 RBC/hpf   WBC, UA 0-5 0 - 5 WBC/hpf   Bacteria, UA FEW (A) NONE  SEEN   Squamous Epithelial / LPF 6-10 0 - 5   Mucus PRESENT    Sperm, UA PRESENT   Wet prep,  genital     Status: None   Collection Time: 06/11/21 11:06 AM  Result Value Ref Range   Yeast Wet Prep HPF POC NONE SEEN NONE SEEN   Trich, Wet Prep NONE SEEN NONE SEEN   Clue Cells Wet Prep HPF POC NONE SEEN NONE SEEN   WBC, Wet Prep HPF POC <10 <10   Sperm PRESENT   CBC     Status: None   Collection Time: 06/11/21 11:28 AM  Result Value Ref Range   WBC 4.9 4.0 - 10.5 K/uL   RBC 4.28 3.87 - 5.11 MIL/uL   Hemoglobin 12.5 12.0 - 15.0 g/dL   HCT 37.8 36.0 - 46.0 %   MCV 88.3 80.0 - 100.0 fL   MCH 29.2 26.0 - 34.0 pg   MCHC 33.1 30.0 - 36.0 g/dL   RDW 12.9 11.5 - 15.5 %   Platelets 243 150 - 400 K/uL   nRBC 0.0 0.0 - 0.2 %  hCG, quantitative, pregnancy     Status: Abnormal   Collection Time: 06/11/21 11:28 AM  Result Value Ref Range   hCG, Beta Chain, Quant, S 3,082 (H) <5 mIU/mL   US OB LESS THAN 14 WEEKS WITH OB TRANSVAGINAL  Result Date: 06/11/2021 CLINICAL DATA:  Cramps with low abdominal pain. Positive pregnancy test. EXAM: OBSTETRIC <14 WK [place] AND TRANSVAGINAL OB US TECHNIQUE: Both transabdominal and transvaginal ultrasound examinations were performed for complete evaluation of the gestation as well as the maternal uterus, adnexal regions, and pelvic cul-de-sac. Transvaginal technique was performed to assess early pregnancy. COMPARISON:  None. FINDINGS: Intrauterine gestational sac: Single gestational sac identified within the decidualized endometrium. Yolk sac:  Not visualized not visualized Embryo:  Not visualized Cardiac Activity: N/A Heart Rate: N/A  bpm MSD: 3.2 mm   5 w   0 d Subchorionic hemorrhage:  None visualized. Maternal uterus/adnexae: Right ovary not visualized. Left ovary measures 2.4 x 2.2 x 1.6 cm without mass lesion. No free fluid. IMPRESSION: Probable early intrauterine gestational sac at estimated 5 weeks 0 day gestational age by mean sac diameter, but no yolk sac, fetal  pole, or cardiac activity yet visualized. Recommend follow-up quantitative B-HCG levels and follow-up US in 14 days to assess viability. This recommendation follows SRU consensus guidelines: Diagnostic Criteria for Nonviable Pregnancy Early in the First Trimester. [place] Med 2013; 578:4696-29. Electronically Signed   By: Misty Stanley M.D.   On: 06/11/2021 12:56     MAU Course  Procedures  MDM DW Dr. Nehemiah Settle: HCG> 3000 and no definite IUP seen. Will get hcg in 48 hours.    Assessment and Plan   1. Pregnancy of unknown anatomic location   2. Pelvic pain in pregnancy, antepartum, first trimester   3. Type A blood, Rh positive    DC home 1st Trimester precautions  Bleeding precautions Ectopic precautions RX: zofran 4mg  PRN #[id number]  Return to MAU as needed FU with OB as planned   [place] [facility] Follow up.   Specialty: Obstetrics and Gynecology Why: 48 hours for repeat blood work Contact information: [place] [id number] [place], [place]  06/11/21  1:15 PM

## 2021-06-12 LAB — GC/CHLAMYDIA PROBE AMP (~~LOC~~) NOT AT ARMC
Chlamydia: NEGATIVE
Comment: NEGATIVE
Comment: NORMAL
Neisseria Gonorrhea: NEGATIVE

## 2021-06-13 ENCOUNTER — Other Ambulatory Visit: Payer: Self-pay

## 2021-06-13 ENCOUNTER — Ambulatory Visit (INDEPENDENT_AMBULATORY_CARE_PROVIDER_SITE_OTHER): Payer: Medicaid Other

## 2021-06-13 VITALS — BP 112/73 | HR 75 | Wt 203.7 lb

## 2021-06-13 DIAGNOSIS — O3680X0 Pregnancy with inconclusive fetal viability, not applicable or unspecified: Secondary | ICD-10-CM

## 2021-06-13 DIAGNOSIS — O209 Hemorrhage in early pregnancy, unspecified: Secondary | ICD-10-CM

## 2021-06-13 LAB — BETA HCG QUANT (REF LAB): hCG Quant: 4374 m[IU]/mL

## 2021-06-13 NOTE — Progress Notes (Signed)
Patient here today for stat hCG following up from MAU visit on 06/11/21 for abdominal cramping, lower back pain and brown vaginal spotting. Patient states today she has light abdominal cramping and lower back pain that she rates 3/10. Patient denies any vaginal bleeding or spotting today. Patient's hCG on 06/11/21 was 3,082. Stat hCG today was 4,374. Reviewed with Dr. Ernestina Patches. Per Dr. Ernestina Patches these results are concerning for a failed pregnancy or an ectopic pregnancy. Per Dr. Ernestina Patches patient to return in 2 days for another stat hCG and return in 2 weeks for another ultrasound. I called patient to review this with her. I also reviewed bleeding precautions and ectopic precautions. Patient denies any other questions.   Paulina Fusi, RN 06/13/21

## 2021-06-15 ENCOUNTER — Encounter: Payer: Self-pay | Admitting: General Practice

## 2021-06-15 ENCOUNTER — Ambulatory Visit: Payer: Medicaid Other

## 2021-06-25 ENCOUNTER — Other Ambulatory Visit: Payer: Self-pay

## 2021-06-25 ENCOUNTER — Ambulatory Visit
Admission: RE | Admit: 2021-06-25 | Discharge: 2021-06-25 | Disposition: A | Discharge status: Home / Self Care | Payer: Medicaid Other | Source: Ambulatory Visit | Attending: Family Medicine | Admitting: Family Medicine

## 2021-06-25 DIAGNOSIS — O3680X0 Pregnancy with inconclusive fetal viability, not applicable or unspecified: Secondary | ICD-10-CM | POA: Diagnosis not present

## 2021-06-26 ENCOUNTER — Encounter: Payer: Self-pay | Admitting: General Practice

## 2021-07-09 ENCOUNTER — Inpatient Hospital Stay (HOSPITAL_COMMUNITY)
Admission: AD | Admit: 2021-07-09 | Discharge: 2021-07-10 | Disposition: A | Discharge status: Home / Self Care | Payer: Medicaid Other | Attending: Obstetrics and Gynecology | Admitting: Obstetrics and Gynecology

## 2021-07-09 ENCOUNTER — Other Ambulatory Visit: Payer: Self-pay

## 2021-07-09 DIAGNOSIS — O26851 Spotting complicating pregnancy, first trimester: Secondary | ICD-10-CM

## 2021-07-09 DIAGNOSIS — O09291 Supervision of pregnancy with other poor reproductive or obstetric history, first trimester: Secondary | ICD-10-CM | POA: Insufficient documentation

## 2021-07-09 DIAGNOSIS — Z3491 Encounter for supervision of normal pregnancy, unspecified, first trimester: Secondary | ICD-10-CM

## 2021-07-09 DIAGNOSIS — O98811 Other maternal infectious and parasitic diseases complicating pregnancy, first trimester: Secondary | ICD-10-CM | POA: Insufficient documentation

## 2021-07-09 DIAGNOSIS — Z3A09 9 weeks gestation of pregnancy: Secondary | ICD-10-CM | POA: Diagnosis not present

## 2021-07-09 DIAGNOSIS — O26891 Other specified pregnancy related conditions, first trimester: Secondary | ICD-10-CM | POA: Diagnosis present

## 2021-07-09 DIAGNOSIS — Z6710 Type A blood, Rh positive: Secondary | ICD-10-CM | POA: Insufficient documentation

## 2021-07-09 NOTE — MAU Note (Signed)
PT SAYS EDC IS WRONG- SHE'S  9 WEEKS  ?SPOTTING WHEN WIPED AT 903PM ?NO MORE SINCE ?HER CONCERN- HAS BACK ACHE- WORRIED ABOUT HX OF UTI - BURNING WITH VOIDING -STARTED 2 WEEKS AGO  ?HAS HAD ABD CRAMPING - UNSURE IF GAS  ?[place]  ?

## 2021-07-10 ENCOUNTER — Encounter (HOSPITAL_COMMUNITY): Payer: Self-pay | Admitting: Obstetrics and Gynecology

## 2021-07-10 ENCOUNTER — Encounter: Payer: Self-pay | Admitting: Advanced Practice Midwife

## 2021-07-10 DIAGNOSIS — O26851 Spotting complicating pregnancy, first trimester: Secondary | ICD-10-CM | POA: Diagnosis not present

## 2021-07-10 DIAGNOSIS — Z3A09 9 weeks gestation of pregnancy: Secondary | ICD-10-CM | POA: Diagnosis not present

## 2021-07-10 LAB — URINALYSIS, ROUTINE W REFLEX MICROSCOPIC
Bilirubin Urine: NEGATIVE
Glucose, UA: NEGATIVE mg/dL
Hgb urine dipstick: NEGATIVE
Ketones, ur: 5 mg/dL — AB
Nitrite: NEGATIVE
Protein, ur: NEGATIVE mg/dL
Specific Gravity, Urine: 1.025 (ref 1.005–1.030)
pH: 7 (ref 5.0–8.0)

## 2021-07-10 LAB — WET PREP, GENITAL
Clue Cells Wet Prep HPF POC: NONE SEEN
Sperm: NONE SEEN
Trich, Wet Prep: NONE SEEN
WBC, Wet Prep HPF POC: >=10 — AB (ref <10)

## 2021-07-10 LAB — GC/CHLAMYDIA PROBE AMP (~~LOC~~) NOT AT ARMC
Chlamydia: NEGATIVE
Comment: NEGATIVE
Comment: NORMAL
Neisseria Gonorrhea: NEGATIVE

## 2021-07-10 MED ORDER — TERCONAZOLE 0.8 % VA CREA: 1.0000 | TOPICAL_CREAM | Freq: Every day | VAGINAL | 0 refills | Status: DC

## 2021-07-10 NOTE — Discharge Instructions (Signed)

## 2021-07-10 NOTE — MAU Provider Note (Signed)
?History  ?  ? ?CSN: [id number] ? ?Arrival date and time: 07/09/21 2329 ? ? Event Date/Time  ? First Provider Initiated Contact with Patient 07/10/21 0006   ?  ? ?Chief Complaint  ?Patient presents with  ? Back Pain  ? ?HPI ?Danielle Evans is a 31 y.o. [id number] with confirmed IUP at [redacted]w[redacted]d She presents to MAU with chief complaint of vaginal spotting.  This is a new problem, onset tonight at 2100 hours. Patient endorses seeing blood when she wiped after voiding x 1 occasion. She denies additional episodes of bleeding. Most recent sexual intercourse about two days ago. ? ?Patient also c/o two week history of dysuria. She denies flank pain, fever. ? ?Patient also reports intermittent, recurrent lower abdominal cramping. She denies aggravating or alleviating factors. She has not taken medication or tried other treatments for this complaint. ? ?[facility] ? ?OB History   ? ? Gravida  ?6  ? Para  ?1  ? Term  ?1  ? Preterm  ?   ? AB  ?4  ? Living  ?1  ?  ? ? SAB  ?3  ? IAB  ?   ? Ectopic  ?1  ? Multiple  ?   ? Live Births  ?   ?   ?  ? Obstetric Comments  ?"ectopic" in 2017 was actually an ovarian cyst, removed d/t being complex.  Salpingectomy d/t hydrosalpinx.  Removed from history  ?  ? ?  ? ? ?Past Medical History:  ?Diagnosis Date  ? Attention and concentration deficit   ? Bipolar 1 disorder ([place]   ? Dysmenorrhea   ? Ectopic pregnancy   ? Exposure to STD   ? GERD (gastroesophageal reflux disease)   ? Headache   ? Migraines  ? 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 re -biopsied nasal turbinate confirmed not cancer  ? 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 Ovarian cyst   ? Pneumonia   ? Sleep apnea   ? childhood, adenoidectomy  ? Vaginal Pap smear, abnormal 2014  ? ? ?Past Surgical History:  ?Procedure Laterality Date  ? DILATION AND CURETTAGE OF UTERUS    ? LAPAROSCOPIC UNILATERAL SALPINGECTOMY Left 11/21/2015  ? Procedure: LAPAROSCOPIC UNILATERAL SALPINGECTOMY;  Surgeon: JWoodroe Mode MD;   [facility];  Service: Gynecology;  Laterality: Left;  ? LAPAROSCOPY N/A 11/21/2015  ? Procedure: LAPAROSCOPY DIAGNOSTIC;  Surgeon: JWoodroe Mode MD;  [facility];  Service: Gynecology;  Laterality: N/A;  ? NASAL SINUS SURGERY    ? TONSILECTOMY, ADENOIDECTOMY, BILATERAL MYRINGOTOMY AND TUBES    ? TONSILLECTOMY    ? TUBAL LIGATION    ? ? ?Family History  ?Problem Relation Age of Onset  ? Heart disease Father   ? Cancer Maternal Grandmother   ?     breast  ? Parkinsonism Maternal Grandfather   ? Cancer Paternal Aunt   ?     breast   ? ? ?Social History  ? ?Tobacco Use  ? Smoking status: Former  ?  Years: 3.00  ?  Types: Cigarettes  ?  Quit date: 10/23/2011  ?  Years since quitting: 9.7  ? Smokeless tobacco: Never  ?Vaping Use  ? Vaping Use: Never used  ?Substance Use Topics  ? Alcohol use: No  ?  Alcohol/week: 3.0 standard drinks  ?  Types: 3 Standard drinks or equivalent per week  ?  Comment: not with preg  ? Drug use:  No  ?  Comment: hx of marijuana, not with preg  ? ? ?Allergies:  ?Allergies  ?Allergen Reactions  ? Ceclor [Cefaclor] Hives  ? Morphine And Related Itching  ? Promethazine Nausea And Vomiting  ? ? ?Medications Prior to Admission  ?Medication Sig Dispense Refill Last Dose  ? ondansetron (ZOFRAN) 4 MG tablet Take 1 tablet (4 mg total) by mouth every 8 (eight) hours as needed for nausea or vomiting. (Patient not taking: Reported on 06/13/2021) 20 tablet 0   ? ? ?Review of Systems  ?Gastrointestinal:  Positive for abdominal pain.  ?Genitourinary:  Positive for dysuria and vaginal bleeding.  ?Musculoskeletal:  Positive for back pain.  ?All other systems reviewed and are negative. ?Physical Exam  ? ?Blood pressure (!) 116/54, pulse 71, temperature 98.5 ?F (36.9 ?C), temperature source Oral, resp. rate 20, height '5\' 7"'$  (1.702 m), weight 93.2 kg, last menstrual period 04/19/2021. ? ?Physical Exam ?Vitals and nursing note reviewed. Exam conducted with a chaperone present.  ?Constitutional:   ?    Appearance: Normal appearance.  ?Cardiovascular:  ?   Rate and Rhythm: Normal rate and regular rhythm.  ?   Pulses: Normal pulses.  ?   Heart sounds: Normal heart sounds.  ?Pulmonary:  ?   Effort: Pulmonary effort is normal.  ?   Breath sounds: Normal breath sounds.  ?Abdominal:  ?   General: Abdomen is flat.  ?   Tenderness: There is no abdominal tenderness. There is no right CVA tenderness or left CVA tenderness.  ?Skin: ?   Capillary Refill: Capillary refill takes less than 2 seconds.  ?Neurological:  ?   Mental Status: She is alert and oriented to person, place, and time.  ?Psychiatric:     ?   Mood and Affect: Mood normal.     ?   Behavior: Behavior normal.     ?   Thought Content: Thought content normal.     ?   Judgment: Judgment normal.  ? ? ?MAU Course  ?Procedures ? ?MDM ?--EMR reviewed. IUP confirmed 06/25/2021 ?--Live IUP, audible FHT on BSUS ?--Swabs collected via blind swab ?--Given one episode of spotting and reassuring BSUS, will defer blood work, pelvic exam ?--Abnormal UA, moderate leuks, negative nitrites, negative hematuria. Discussed initiating rx for abx now vs waiting for culture. Patient verbalizes preference to wait for culture. ? ?Patient Vitals for the past 24 hrs: ? BP Temp Temp src Pulse Resp Height Weight  ?07/10/21 0004 (!) 116/54 -- -- 71 -- -- --  ?07/09/21 2346 (!) 108/52 98.5 ?F (36.9 ?C) Oral 69 20 '5\' 7"'$  (1.702 m) 93.2 kg  ? ? ?Results for orders placed or performed during the hospital encounter of 07/09/21 (from the past 24 hour(s))  ?Urinalysis, Routine w reflex microscopic Urine, Clean Catch     Status: Abnormal  ? Collection Time: 07/09/21 11:50 PM  ?Result Value Ref Range  ? Color, Urine YELLOW YELLOW  ? APPearance HAZY (A) CLEAR  ? Specific Gravity, Urine 1.025 1.005 - 1.030  ? pH 7.0 5.0 - 8.0  ? Glucose, UA NEGATIVE NEGATIVE mg/dL  ? Hgb urine dipstick NEGATIVE NEGATIVE  ? Bilirubin Urine NEGATIVE NEGATIVE  ? Ketones, ur 5 (A) NEGATIVE mg/dL  ? Protein, ur NEGATIVE  NEGATIVE mg/dL  ? Nitrite NEGATIVE NEGATIVE  ? Leukocytes,Ua MODERATE (A) NEGATIVE  ? RBC / HPF 0-5 0 - 5 RBC/hpf  ? WBC, UA 0-5 0 - 5 WBC/hpf  ? Bacteria, UA RARE (A) NONE SEEN  ?  Squamous Epithelial / LPF 0-5 0 - 5  ? Mucus PRESENT   ?Wet prep, genital     Status: Abnormal  ? Collection Time: 07/10/21 12:15 AM  ?Result Value Ref Range  ? Yeast Wet Prep HPF POC PRESENT (A) NONE SEEN  ? Trich, Wet Prep NONE SEEN NONE SEEN  ? Clue Cells Wet Prep HPF POC NONE SEEN NONE SEEN  ? WBC, Wet Prep HPF POC >=10 (A) <10  ? Sperm NONE SEEN   ? ?Meds ordered this encounter  ?Medications  ? terconazole (TERAZOL 3) 0.8 % vaginal cream  ?  Sig: Place 1 applicator vaginally at bedtime. Apply nightly for three nights.  ?  Dispense:  20 g  ?  Refill:  0  ?  Order Specific Question:   Supervising Provider  ?  Answer:   Griffin Basil [[id number]]  ? ?Assessment and Plan  ?--31 y.o. [id number] with live IUP at [redacted]w[redacted]d ?--Blood type A POS ?--Yeast infection, rx to pharmacy ?--Urine culture in work ?--Discharge home in stable condition ? ?SDarlina Rumpf MSA, MSN, CNM ?07/10/2021, 1:49 AM  ?

## 2021-07-11 ENCOUNTER — Encounter: Payer: Self-pay | Admitting: Advanced Practice Midwife

## 2021-07-11 LAB — CULTURE, OB URINE: Culture: >=100000 — AB

## 2021-07-18 ENCOUNTER — Telehealth: Payer: Medicaid Other

## 2021-07-24 ENCOUNTER — Other Ambulatory Visit: Payer: Self-pay

## 2021-07-24 ENCOUNTER — Encounter: Payer: Self-pay | Admitting: Obstetrics & Gynecology

## 2021-07-24 ENCOUNTER — Ambulatory Visit (INDEPENDENT_AMBULATORY_CARE_PROVIDER_SITE_OTHER): Payer: Medicaid Other | Admitting: Obstetrics & Gynecology

## 2021-07-24 VITALS — BP 127/74 | HR 68 | Wt 208.0 lb

## 2021-07-24 DIAGNOSIS — Z3A11 11 weeks gestation of pregnancy: Secondary | ICD-10-CM

## 2021-07-24 DIAGNOSIS — Z3480 Encounter for supervision of other normal pregnancy, unspecified trimester: Secondary | ICD-10-CM

## 2021-07-24 DIAGNOSIS — Z3481 Encounter for supervision of other normal pregnancy, first trimester: Secondary | ICD-10-CM | POA: Diagnosis not present

## 2021-07-24 MED ORDER — ASPIRIN EC 81 MG PO TBEC: 81.0000 mg | DELAYED_RELEASE_TABLET | Freq: Every day | ORAL | 2 refills | Status: DC

## 2021-07-24 NOTE — Progress Notes (Signed)
? ?History:  ? Danielle Evans is a 31 y.o. [id number] at 73w2dby 7 week ultrasound not consistent with LMP being seen today for her first obstetrical visit.  Her obstetrical history is significant for  one uncomplicated term vaginal delivery, but has SAB x3, ectopic x 1   Hsitory of left salpingectomy due to hydrosalpinx in 2017. Patient does intend to breast feed. Pregnancy history fully reviewed. ? ?Patient reports  having milky breast drainage occasionally, none currently.  Desires breast examination . ?  ? ?  ?HISTORY: ?OB History  ?Gravida Para Term Preterm AB Living  ?'6 1 1 '$ 0 4 1  ?SAB IAB Ectopic Multiple Live Births  ?3 0 1 0 0  ?  ?# Outcome Date GA Lbr Len/2nd Weight Sex Delivery Anes PTL Lv  ?6 Current           ?5 SAB 03/2020          ?4 Term 03/30/17 328w6d  Vag-Spont     ?3 SAB 02/2015 116w0d      ?2 Ectopic 2015 10w9w0d     ?1 SAB 2011 45w0d31w0d    ?  ?Last pap smear was done 2017 and was normal ? ?Past Medical History:  ?Diagnosis Date  ? Anxiety   ? Attention and concentration deficit   ? Dysmenorrhea   ? Ectopic pregnancy   ? GERD (gastroesophageal reflux disease)   ? Headache   ? Migraines  ? Osteoid sarcoma (HCC) [place] re -biopsied nasal turbinate confirmed not cancer  ? Ovarian cyst   ? Pap smear abnormality of cervix 2014  ? Pneumonia   ? Sleep apnea   ? childhood, adenoidectomy  ? ?Past Surgical History:  ?Procedure Laterality Date  ? DILATION AND CURETTAGE OF UTERUS    ? LAPAROSCOPIC UNILATERAL SALPINGECTOMY Left 11/21/2015  ? Procedure: LAPAROSCOPIC UNILATERAL SALPINGECTOMY;  Surgeon: JamesWoodroe Mode  [facility]  Service: Gynecology;  Laterality: Left;  ? LAPAROSCOPY N/A 11/21/2015  ? Procedure: LAPAROSCOPY DIAGNOSTIC;  Surgeon: JamesWoodroe Mode  [facility]  Service: Gynecology;  Laterality: N/A;  ? NASAL SINUS SURGERY    ? TONSILECTOMY, ADENOIDECTOMY, BILATERAL MYRINGOTOMY AND TUBES    ? ?Family History  ?Problem Relation Age of Onset  ? Heart disease Father   ?  Cancer Maternal Grandmother   ?     breast  ? Parkinsonism Maternal Grandfather   ? Cancer Paternal Aunt   ?     breast   ? ?Social History  ? ?Tobacco Use  ? Smoking status: Former  ?  Years: 3.00  ?  Types: Cigarettes  ?  Quit date: 10/23/2011  ?  Years since quitting: 9.7  ? Smokeless tobacco: Never  ?Vaping Use  ? Vaping Use: Never used  ?Substance Use Topics  ? Alcohol use: No  ?  Alcohol/week: 3.0 standard drinks  ?  Types: 3 Standard drinks or equivalent per week  ?  Comment: not with preg  ? Drug use: No  ?  Comment: hx of marijuana, not with preg  ? ?Allergies  ?Allergen Reactions  ? Ceclor [Cefaclor] Hives  ?  But has tolerated other cephalosporins, PCN, Augmetin  ? Morphine And Related Itching  ? Promethazine Nausea And Vomiting  ? ?Current Outpatient Medications on File Prior to Visit  ?Medication Sig Dispense Refill  ? gabapentin (NEURONTIN) 100 MG capsule Take 100 mg by  mouth at bedtime.    ? ondansetron (ZOFRAN) 4 MG tablet Take 1 tablet (4 mg total) by mouth every 8 (eight) hours as needed for nausea or vomiting. (Patient not taking: Reported on 06/13/2021) 20 tablet 0  ? terconazole (TERAZOL 3) 0.8 % vaginal cream Place 1 applicator vaginally at bedtime. Apply nightly for three nights. 20 g 0  ? [DISCONTINUED] etonogestrel-ethinyl estradiol (NUVARING) 0.12-0.015 MG/24HR vaginal ring Use 1 vaginal ring every 3 weeks without week-free 1 each 11  ? [DISCONTINUED] norethindrone-ethinyl estradiol 1/35 (Reform 1/35, 28,) tablet 1 tablet by mouth 3 times daily for 7 days, then begin one table daily thereafter 2 Package 11  ? ?No current facility-administered medications on file prior to visit.  ? ? ?Review of Systems ?Pertinent items noted in HPI and remainder of comprehensive ROS otherwise negative. ? ?Indications for ASA therapy (per UpToDate) ?Obesity (body mass index >30 kg/m2) Yes ?Personal risk factors (eg, previous pregnancy with low birth weight or small for gestational age infant, previous  adverse pregnancy outcome [eg, stillbirth], interval >10 years between pregnancies) Yes ? ?Indications for early GDM screening (FBS, A1C, Random CBG, GTT) ?BMI >30kg/m2 Yes ? ?Physical Exam:  ? ?Vitals:  ? 07/24/21 1107  ?BP: 127/74  ?Pulse: 68  ?Weight: 208 lb (94.3 kg)  ? ?Fetal Heart Rate (bpm): 176   ?General: well-developed, well-nourished female in no acute distress  ?Breasts:  normal appearance, no masses or tenderness bilaterally, exam done in the presence of a chaperone.   ?Skin: normal coloration and turgor, no rashes  ?Neurologic: oriented, normal, negative, normal mood  ?Extremities: normal strength, tone, and muscle mass, ROM of all joints is normal  ?HEENT PERRLA, extraocular movement intact and sclera clear, anicteric  ?Neck supple and no masses  ?Cardiovascular: regular rate and rhythm  ?Respiratory:  no respiratory distress, normal breath sounds  ?Abdomen: soft, non-tender; bowel sounds normal; no masses,  no organomegaly  ?Pelvic: pap deferred until next visit.  ?  ?Assessment:  ?  ?Pregnancy: [id number] ?Patient Active Problem List  ? Diagnosis Date Noted  ? Supervision of other normal pregnancy, antepartum 07/24/2021  ? Attention and concentration deficit   ? ?  ?Plan:  ?  ?1. [redacted] weeks gestation of pregnancy ?2. Encounter for supervision of other normal pregnancy in first trimester ?- CBC/D/Plt+RPR+Rh+ABO+RubIgG... ?- HgA1C ?- Genetic Screening ?- [place] MFM OB DETAIL +14 WK; Future ?- aspirin EC 81 MG tablet; Take 1 tablet (81 mg total) by mouth daily. Take after 12 weeks for prevention of preeclampsia later in pregnancy  Dispense: 300 tablet; Refill: 2 ?Initial labs drawn, pap deferred until next visit. ?Continue prenatal vitamins. ?Problem list reviewed and updated. ?Genetic Screening discussed, NIPS: ordered. ?Ultrasound discussed; fetal anatomic survey: ordered. ?Anticipatory guidance about prenatal visits given including labs, ultrasounds, and testing. ?Discussed usage of the Babyscripts app for  more information about pregnancy, and to track blood pressures. ?Also discussed usage of virtual visits as additional source of managing and completing prenatal visits.   ?Patient was encouraged to use MyChart to review results, send requests, and have questions addressed.   ?The nature of [facility] [place] [facility] Practice with multiple MDs and Advanced Practice Providers was explained to patient; also emphasized that residents, students are part of our team. ?Routine obstetric precautions reviewed. Encouraged to seek out care at office or emergency room [facility] MAU preferred) for urgent and/or emergent concerns. ?Return in about 4 weeks (around 08/21/2021) for OFFICE OB VISIT (MD or APP), pap  smear.  ?  ? ?Verita Schneiders, MD, FACOG ?Obstetrician Social research officer, government, Faculty Practice ?[facility] ? ? ?

## 2021-07-25 LAB — CBC/D/PLT+RPR+RH+ABO+RUBIGG...
Antibody Screen: NEGATIVE
Basophils Absolute: 0 10*3/uL (ref 0.0–0.2)
Basos: 0 %
EOS (ABSOLUTE): 0.1 10*3/uL (ref 0.0–0.4)
Eos: 1 %
HCV Ab: NONREACTIVE
HIV Screen 4th Generation wRfx: NONREACTIVE
Hematocrit: 40 % (ref 34.0–46.6)
Hemoglobin: 13.1 g/dL (ref 11.1–15.9)
Hepatitis B Surface Ag: NEGATIVE
Immature Grans (Abs): 0 10*3/uL (ref 0.0–0.1)
Immature Granulocytes: 0 %
Lymphocytes Absolute: 1.4 10*3/uL (ref 0.7–3.1)
Lymphs: 23 %
MCH: 29 pg (ref 26.6–33.0)
MCHC: 32.8 g/dL (ref 31.5–35.7)
MCV: 89 fL (ref 79–97)
Monocytes Absolute: 0.3 10*3/uL (ref 0.1–0.9)
Monocytes: 6 %
Neutrophils Absolute: 4.2 10*3/uL (ref 1.4–7.0)
Neutrophils: 70 %
Platelets: 236 10*3/uL (ref 150–450)
RBC: 4.51 x10E6/uL (ref 3.77–5.28)
RDW: 13.1 % (ref 11.7–15.4)
RPR Ser Ql: NONREACTIVE
Rh Factor: POSITIVE
Rubella Antibodies, IGG: 1 index (ref >0.99)
WBC: 6 10*3/uL (ref 3.4–10.8)

## 2021-07-25 LAB — HEMOGLOBIN A1C
Est. average glucose Bld gHb Est-mCnc: 100 mg/dL
Hgb A1c MFr Bld: 5.1 % (ref 4.8–5.6)

## 2021-07-25 LAB — HCV INTERPRETATION

## 2021-07-31 ENCOUNTER — Telehealth: Payer: Self-pay | Admitting: *Deleted

## 2021-07-31 ENCOUNTER — Encounter: Payer: Self-pay | Admitting: *Deleted

## 2021-07-31 NOTE — Telephone Encounter (Signed)
Pt informed of panorama results and mother in law informed of fetal sex.  ?

## 2021-08-02 ENCOUNTER — Encounter: Payer: Self-pay | Admitting: *Deleted

## 2021-08-07 ENCOUNTER — Other Ambulatory Visit: Payer: Self-pay | Admitting: *Deleted

## 2021-08-07 ENCOUNTER — Encounter: Payer: Self-pay | Admitting: Advanced Practice Midwife

## 2021-08-07 MED ORDER — ONDANSETRON HCL 4 MG PO TABS: 4.0000 mg | ORAL_TABLET | Freq: Three times a day (TID) | ORAL | 1 refills | Status: DC | PRN

## 2021-08-16 ENCOUNTER — Encounter: Payer: Self-pay | Admitting: Advanced Practice Midwife

## 2021-08-18 ENCOUNTER — Inpatient Hospital Stay (EMERGENCY_DEPARTMENT_HOSPITAL)
Admission: AD | Admit: 2021-08-18 | Discharge: 2021-08-19 | Disposition: A | Discharge status: Home / Self Care | Payer: Medicaid Other | Source: Home / Self Care | Attending: Obstetrics and Gynecology | Admitting: Obstetrics and Gynecology

## 2021-08-18 ENCOUNTER — Encounter (HOSPITAL_COMMUNITY): Payer: Self-pay | Admitting: Obstetrics and Gynecology

## 2021-08-18 ENCOUNTER — Inpatient Hospital Stay (HOSPITAL_COMMUNITY)
Admission: AD | Admit: 2021-08-18 | Discharge: 2021-08-18 | Disposition: A | Discharge status: Home / Self Care | Payer: Medicaid Other | Attending: Obstetrics & Gynecology | Admitting: Obstetrics & Gynecology

## 2021-08-18 ENCOUNTER — Encounter (HOSPITAL_COMMUNITY): Payer: Self-pay | Admitting: Obstetrics & Gynecology

## 2021-08-18 ENCOUNTER — Inpatient Hospital Stay (HOSPITAL_COMMUNITY): Payer: Medicaid Other

## 2021-08-18 ENCOUNTER — Other Ambulatory Visit: Payer: Self-pay

## 2021-08-18 DIAGNOSIS — R112 Nausea with vomiting, unspecified: Secondary | ICD-10-CM | POA: Insufficient documentation

## 2021-08-18 DIAGNOSIS — Z3A15 15 weeks gestation of pregnancy: Secondary | ICD-10-CM

## 2021-08-18 DIAGNOSIS — O09292 Supervision of pregnancy with other poor reproductive or obstetric history, second trimester: Secondary | ICD-10-CM | POA: Insufficient documentation

## 2021-08-18 DIAGNOSIS — R1013 Epigastric pain: Secondary | ICD-10-CM | POA: Insufficient documentation

## 2021-08-18 DIAGNOSIS — Z3A14 14 weeks gestation of pregnancy: Secondary | ICD-10-CM | POA: Insufficient documentation

## 2021-08-18 DIAGNOSIS — O99352 Diseases of the nervous system complicating pregnancy, second trimester: Secondary | ICD-10-CM | POA: Insufficient documentation

## 2021-08-18 DIAGNOSIS — R109 Unspecified abdominal pain: Secondary | ICD-10-CM | POA: Insufficient documentation

## 2021-08-18 DIAGNOSIS — R1010 Upper abdominal pain, unspecified: Secondary | ICD-10-CM

## 2021-08-18 DIAGNOSIS — O219 Vomiting of pregnancy, unspecified: Secondary | ICD-10-CM | POA: Insufficient documentation

## 2021-08-18 DIAGNOSIS — K529 Noninfective gastroenteritis and colitis, unspecified: Secondary | ICD-10-CM | POA: Diagnosis not present

## 2021-08-18 DIAGNOSIS — O26892 Other specified pregnancy related conditions, second trimester: Secondary | ICD-10-CM | POA: Diagnosis not present

## 2021-08-18 LAB — URINALYSIS, ROUTINE W REFLEX MICROSCOPIC
Glucose, UA: NEGATIVE mg/dL
Glucose, UA: NEGATIVE mg/dL
Hgb urine dipstick: NEGATIVE
Hgb urine dipstick: NEGATIVE
Ketones, ur: 20 mg/dL — AB
Ketones, ur: 80 mg/dL — AB
Leukocytes,Ua: NEGATIVE
Nitrite: NEGATIVE
Nitrite: NEGATIVE
Protein, ur: 30 mg/dL — AB
Protein, ur: 100 mg/dL — AB
Specific Gravity, Urine: 1.031 — ABNORMAL HIGH (ref 1.005–1.030)
Specific Gravity, Urine: 1.032 — ABNORMAL HIGH (ref 1.005–1.030)
pH: 5 (ref 5.0–8.0)
pH: 5 (ref 5.0–8.0)

## 2021-08-18 LAB — CBC WITH DIFFERENTIAL/PLATELET
Abs Immature Granulocytes: 0.05 10*3/uL (ref 0.00–0.07)
Abs Immature Granulocytes: 0.04 10*3/uL (ref 0.00–0.07)
Basophils Absolute: 0 10*3/uL (ref 0.0–0.1)
Basophils Absolute: 0 10*3/uL (ref 0.0–0.1)
Basophils Relative: 0 %
Basophils Relative: 0 %
Eosinophils Absolute: 0.1 10*3/uL (ref 0.0–0.5)
Eosinophils Absolute: 0 10*3/uL (ref 0.0–0.5)
Eosinophils Relative: 1 %
Eosinophils Relative: 0 %
HCT: 39.6 % (ref 36.0–46.0)
HCT: 38.4 % (ref 36.0–46.0)
Hemoglobin: 14 g/dL (ref 12.0–15.0)
Hemoglobin: 13.3 g/dL (ref 12.0–15.0)
Immature Granulocytes: 0 %
Immature Granulocytes: 1 %
Lymphocytes Relative: 10 %
Lymphocytes Relative: 9 %
Lymphs Abs: 1.3 10*3/uL (ref 0.7–4.0)
Lymphs Abs: 0.8 10*3/uL (ref 0.7–4.0)
MCH: 30.4 pg (ref 26.0–34.0)
MCH: 30.3 pg (ref 26.0–34.0)
MCHC: 35.4 g/dL (ref 30.0–36.0)
MCHC: 34.6 g/dL (ref 30.0–36.0)
MCV: 85.9 fL (ref 80.0–100.0)
MCV: 87.5 fL (ref 80.0–100.0)
Monocytes Absolute: 0.6 10*3/uL (ref 0.1–1.0)
Monocytes Absolute: 0.3 10*3/uL (ref 0.1–1.0)
Monocytes Relative: 4 %
Monocytes Relative: 3 %
Neutro Abs: 12 10*3/uL — ABNORMAL HIGH (ref 1.7–7.7)
Neutro Abs: 7.2 10*3/uL (ref 1.7–7.7)
Neutrophils Relative %: 85 %
Neutrophils Relative %: 87 %
Platelets: 227 10*3/uL (ref 150–400)
Platelets: 199 10*3/uL (ref 150–400)
RBC: 4.61 MIL/uL (ref 3.87–5.11)
RBC: 4.39 MIL/uL (ref 3.87–5.11)
RDW: 12.7 % (ref 11.5–15.5)
RDW: 12.9 % (ref 11.5–15.5)
WBC: 14.1 10*3/uL — ABNORMAL HIGH (ref 4.0–10.5)
WBC: 8.3 10*3/uL (ref 4.0–10.5)
nRBC: 0 % (ref 0.0–0.2)
nRBC: 0 % (ref 0.0–0.2)

## 2021-08-18 LAB — COMPREHENSIVE METABOLIC PANEL
ALT: 11 U/L (ref 0–44)
ALT: 12 U/L (ref 0–44)
AST: 14 U/L — ABNORMAL LOW (ref 15–41)
AST: 15 U/L (ref 15–41)
Albumin: 3.7 g/dL (ref 3.5–5.0)
Albumin: 3.4 g/dL — ABNORMAL LOW (ref 3.5–5.0)
Alkaline Phosphatase: 48 U/L (ref 38–126)
Alkaline Phosphatase: 43 U/L (ref 38–126)
Anion gap: 9 (ref 5–15)
Anion gap: 7 (ref 5–15)
BUN: 9 mg/dL (ref 6–20)
BUN: 8 mg/dL (ref 6–20)
CO2: 21 mmol/L — ABNORMAL LOW (ref 22–32)
CO2: 22 mmol/L (ref 22–32)
Calcium: 9.2 mg/dL (ref 8.9–10.3)
Calcium: 8.5 mg/dL — ABNORMAL LOW (ref 8.9–10.3)
Chloride: 108 mmol/L (ref 98–111)
Chloride: 106 mmol/L (ref 98–111)
Creatinine, Ser: 0.8 mg/dL (ref 0.44–1.00)
Creatinine, Ser: 0.8 mg/dL (ref 0.44–1.00)
GFR, Estimated: >60 mL/min (ref >60)
GFR, Estimated: >60 mL/min (ref >60)
Glucose, Bld: 103 mg/dL — ABNORMAL HIGH (ref 70–99)
Glucose, Bld: 98 mg/dL (ref 70–99)
Potassium: 3.4 mmol/L — ABNORMAL LOW (ref 3.5–5.1)
Potassium: 3.4 mmol/L — ABNORMAL LOW (ref 3.5–5.1)
Sodium: 138 mmol/L (ref 135–145)
Sodium: 135 mmol/L (ref 135–145)
Total Bilirubin: 0.6 mg/dL (ref 0.3–1.2)
Total Bilirubin: 0.8 mg/dL (ref 0.3–1.2)
Total Protein: 6.3 g/dL — ABNORMAL LOW (ref 6.5–8.1)
Total Protein: 6 g/dL — ABNORMAL LOW (ref 6.5–8.1)

## 2021-08-18 LAB — AMYLASE
Amylase: 64 U/L (ref 28–100)
Amylase: 39 U/L (ref 28–100)

## 2021-08-18 MED ORDER — FENTANYL CITRATE (PF) 100 MCG/2ML IJ SOLN
100.0000 ug | Freq: Once | INTRAMUSCULAR | Status: AC
  Administered 2021-08-18: 100 ug via INTRAVENOUS
  Filled 2021-08-18: qty 2

## 2021-08-18 MED ORDER — ONDANSETRON HCL 4 MG/2ML IJ SOLN: 4.0000 mg | Freq: Once | INTRAMUSCULAR | Status: DC

## 2021-08-18 MED ORDER — LACTATED RINGERS IV BOLUS
1000.0000 mL | Freq: Once | INTRAVENOUS | Status: AC
  Administered 2021-08-18: 1000 mL via INTRAVENOUS

## 2021-08-18 MED ORDER — HYDROMORPHONE HCL 1 MG/ML IJ SOLN
1.0000 mg | Freq: Once | INTRAMUSCULAR | Status: AC
  Administered 2021-08-18: 1 mg via INTRAVENOUS
  Filled 2021-08-18: qty 1

## 2021-08-18 MED ORDER — SODIUM CHLORIDE 0.9 % IV SOLN
8.0000 mg | Freq: Once | INTRAVENOUS | Status: AC
  Administered 2021-08-18: 8 mg via INTRAVENOUS
  Filled 2021-08-18: qty 4

## 2021-08-18 MED ORDER — FAMOTIDINE IN NACL 20-0.9 MG/50ML-% IV SOLN
20.0000 mg | Freq: Once | INTRAVENOUS | Status: AC
  Administered 2021-08-18: 20 mg via INTRAVENOUS
  Filled 2021-08-18: qty 50

## 2021-08-18 MED ORDER — SODIUM CHLORIDE 0.9 % IV SOLN
25.0000 mg | Freq: Once | INTRAVENOUS | Status: AC
  Administered 2021-08-18: 25 mg via INTRAVENOUS
  Filled 2021-08-18: qty 1

## 2021-08-18 NOTE — MAU Note (Signed)
PT SAYS VOMITING STARTED AT 930P ?FEELS A SPASM IN BACK ?NOONE ELSE SICK ?NO DIARRHEA ?Danielle Evans ?TOOK TUMS AND ZOFRAN- VOMITED BOTH ?

## 2021-08-18 NOTE — MAU Provider Note (Signed)
?History  ?  ? ?CSN: [id number] ? ?Arrival date and time: 08/18/21 0121 ? ? Event Date/Time  ? First Provider Initiated Contact with Patient 08/18/21 0205   ?  ? ?Chief Complaint  ?Patient presents with  ? Abdominal Pain  ? ?Danielle Evans is a 31 y.o. [id number] at 69w6dwho [facility]  She presents today for Abdominal Pain.  She states she started vomiting around 0930pm with onset of pain in stomach and upper back that occurred prior to the incident.  She reports 5 incidents of vomiting and now it is "mostly stomach acid."  Patient reports the pain started after eating: Frozen meal-Chicken Parm by [employer].  She states the pain is constant and feels like "stabbing in my upper stomach." She denies issues with gallstones, but reports having similar symptoms in the past. She denies vaginal concerns including bleeding, discharge, and leaking.  Patient expresses desire to "make sure baby is okay."  ? ? ?OB History   ? ? Gravida  ?6  ? Para  ?1  ? Term  ?1  ? Preterm  ?   ? AB  ?4  ? Living  ?1  ?  ? ? SAB  ?3  ? IAB  ?   ? Ectopic  ?1  ? Multiple  ?   ? Live Births  ?   ?   ?  ?  ? ? ?Past Medical History:  ?Diagnosis Date  ? Anxiety   ? Attention and concentration deficit   ? Dysmenorrhea   ? Ectopic pregnancy   ? GERD (gastroesophageal reflux disease)   ? Headache   ? Migraines  ? [place]   ? re -biopsied nasal turbinate confirmed not cancer  ? Ovarian cyst   ? Pap smear abnormality of cervix 2014  ? Pneumonia   ? Sleep apnea   ? childhood, adenoidectomy  ? ? ?Past Surgical History:  ?Procedure Laterality Date  ? DILATION AND CURETTAGE OF UTERUS    ? LAPAROSCOPIC UNILATERAL SALPINGECTOMY Left 11/21/2015  ? Procedure: LAPAROSCOPIC UNILATERAL SALPINGECTOMY;  Surgeon: JWoodroe Mode MD;  [facility];  Service: Gynecology;  Laterality: Left;  ? LAPAROSCOPY N/A 11/21/2015  ? Procedure: LAPAROSCOPY DIAGNOSTIC;  Surgeon: JWoodroe Mode MD;  [facility];  Service: Gynecology;  Laterality: N/A;   ? NASAL SINUS SURGERY    ? TONSILECTOMY, ADENOIDECTOMY, BILATERAL MYRINGOTOMY AND TUBES    ? ? ?Family History  ?Problem Relation Age of Onset  ? Heart disease Father   ? Cancer Maternal Grandmother   ?     breast  ? Parkinsonism Maternal Grandfather   ? Cancer Paternal Aunt   ?     breast   ? ? ?Social History  ? ?Tobacco Use  ? Smoking status: Former  ?  Years: 3.00  ?  Types: Cigarettes  ?  Quit date: 10/23/2011  ?  Years since quitting: 9.8  ? Smokeless tobacco: Never  ?Vaping Use  ? Vaping Use: Never used  ?Substance Use Topics  ? Alcohol use: No  ?  Alcohol/week: 3.0 standard drinks  ?  Types: 3 Standard drinks or equivalent per week  ?  Comment: not with preg  ? Drug use: No  ?  Comment: hx of marijuana, not with preg  ? ? ?Allergies:  ?Allergies  ?Allergen Reactions  ? Ceclor [Cefaclor] Hives  ?  But has tolerated other cephalosporins, PCN, Augmetin  ? Morphine And Related Itching  ? Promethazine Nausea  And Vomiting  ? ? ?Medications Prior to Admission  ?Medication Sig Dispense Refill Last Dose  ? aspirin EC 81 MG tablet Take 1 tablet (81 mg total) by mouth daily. Take after 12 weeks for prevention of preeclampsia later in pregnancy 300 tablet 2 Past Week  ? calcium carbonate (TUMS - DOSED IN MG ELEMENTAL CALCIUM) 500 MG chewable tablet Chew 1 tablet by mouth daily.   08/17/2021  ? ondansetron (ZOFRAN) 4 MG tablet Take 1 tablet (4 mg total) by mouth every 8 (eight) hours as needed for nausea or vomiting. 20 tablet 1 08/17/2021  ? Prenatal Vit-Fe Fumarate-FA (PRENATAL MULTIVITAMIN) TABS tablet Take 1 tablet by mouth daily at 12 noon.   Past Week  ? gabapentin (NEURONTIN) 100 MG capsule Take 100 mg by mouth at bedtime.     ? terconazole (TERAZOL 3) 0.8 % vaginal cream Place 1 applicator vaginally at bedtime. Apply nightly for three nights. 20 g 0   ? ? ?Review of Systems  ?Gastrointestinal:  Positive for abdominal pain, nausea and vomiting.  ?Genitourinary:  Negative for difficulty urinating, dysuria, vaginal  bleeding and vaginal discharge.  ?Musculoskeletal:  Positive for back pain.  ?Physical Exam  ? ?Blood pressure (!) 125/55, pulse 100, temperature 98 ?F (36.7 ?C), temperature source Oral, resp. rate 18, height '5\' 7"'$  (1.702 m), weight 93.6 kg, last menstrual period 04/19/2021, SpO2 98 %. ? ?Physical Exam ?Constitutional:   ?   General: She is in acute distress.  ?   Appearance: She is well-developed. She is ill-appearing.  ?   Comments: Actively vomiting  ?HENT:  ?   Head: Normocephalic and atraumatic.  ?Eyes:  ?   Conjunctiva/sclera: Conjunctivae normal.  ?Cardiovascular:  ?   Rate and Rhythm: Normal rate.  ?Pulmonary:  ?   Effort: Pulmonary effort is normal. No respiratory distress.  ?Musculoskeletal:  ?   Cervical back: Normal range of motion.  ?Skin: ?   General: Skin is warm and dry.  ?Neurological:  ?   Mental Status: She is alert and oriented to person, place, and time.  ?Psychiatric:     ?   Mood and Affect: Mood normal.     ?   Behavior: Behavior normal.  ? ? ?MAU Course  ?Procedures ?Results for orders placed or performed during the hospital encounter of 08/18/21 (from the past 24 hour(s))  ?Urinalysis, Routine w reflex microscopic Urine, Clean Catch     Status: Abnormal  ? Collection Time: 08/18/21  1:40 AM  ?Result Value Ref Range  ? Color, Urine AMBER (A) YELLOW  ? APPearance HAZY (A) CLEAR  ? Specific Gravity, Urine 1.031 (H) 1.005 - 1.030  ? pH 5.0 5.0 - 8.0  ? Glucose, UA NEGATIVE NEGATIVE mg/dL  ? Hgb urine dipstick NEGATIVE NEGATIVE  ? Bilirubin Urine SMALL (A) NEGATIVE  ? Ketones, ur 20 (A) NEGATIVE mg/dL  ? Protein, ur 30 (A) NEGATIVE mg/dL  ? Nitrite NEGATIVE NEGATIVE  ? Leukocytes,Ua TRACE (A) NEGATIVE  ? RBC / HPF 0-5 0 - 5 RBC/hpf  ? WBC, UA 0-5 0 - 5 WBC/hpf  ? Bacteria, UA RARE (A) NONE SEEN  ? Squamous Epithelial / LPF 6-10 0 - 5  ? Mucus PRESENT   ?CBC with Differential/Platelet     Status: Abnormal  ? Collection Time: 08/18/21  2:21 AM  ?Result Value Ref Range  ? WBC 14.1 (H) 4.0 - 10.5  K/uL  ? RBC 4.61 3.87 - 5.11 MIL/uL  ? Hemoglobin 14.0 12.0 - 15.0  g/dL  ? HCT 39.6 36.0 - 46.0 %  ? MCV 85.9 80.0 - 100.0 fL  ? MCH 30.4 26.0 - 34.0 pg  ? MCHC 35.4 30.0 - 36.0 g/dL  ? RDW 12.7 11.5 - 15.5 %  ? Platelets 227 150 - 400 K/uL  ? nRBC 0.0 0.0 - 0.2 %  ? Neutrophils Relative % 85 %  ? Neutro Abs 12.0 (H) 1.7 - 7.7 K/uL  ? Lymphocytes Relative 10 %  ? Lymphs Abs 1.3 0.7 - 4.0 K/uL  ? Monocytes Relative 4 %  ? Monocytes Absolute 0.6 0.1 - 1.0 K/uL  ? Eosinophils Relative 1 %  ? Eosinophils Absolute 0.1 0.0 - 0.5 K/uL  ? Basophils Relative 0 %  ? Basophils Absolute 0.0 0.0 - 0.1 K/uL  ? Immature Granulocytes 0 %  ? Abs Immature Granulocytes 0.05 0.00 - 0.07 K/uL  ?Comprehensive metabolic panel     Status: Abnormal  ? Collection Time: 08/18/21  2:21 AM  ?Result Value Ref Range  ? Sodium 138 135 - 145 mmol/L  ? Potassium 3.4 (L) 3.5 - 5.1 mmol/L  ? Chloride 108 98 - 111 mmol/L  ? CO2 21 (L) 22 - 32 mmol/L  ? Glucose, Bld 103 (H) 70 - 99 mg/dL  ? BUN 9 6 - 20 mg/dL  ? Creatinine, Ser 0.80 0.44 - 1.00 mg/dL  ? Calcium 9.2 8.9 - 10.3 mg/dL  ? Total Protein 6.3 (L) 6.5 - 8.1 g/dL  ? Albumin 3.7 3.5 - 5.0 g/dL  ? AST 14 (L) 15 - 41 U/L  ? ALT 11 0 - 44 U/L  ? Alkaline Phosphatase 48 38 - 126 U/L  ? Total Bilirubin 0.6 0.3 - 1.2 mg/dL  ? GFR, Estimated >60 >60 mL/min  ? Anion gap 9 5 - 15  ?Amylase     Status: None  ? Collection Time: 08/18/21  2:21 AM  ?Result Value Ref Range  ? Amylase 64 28 - 100 U/L  ? ?US ABDOMEN LIMITED RUQ (LIVER/GB) ? ?Result Date: 08/18/2021 ?CLINICAL DATA:  Vomiting.  Pregnant. EXAM: ULTRASOUND ABDOMEN LIMITED RIGHT UPPER QUADRANT COMPARISON:  None. FINDINGS: Gallbladder: No gallstones or wall thickening visualized. No sonographic Murphy sign noted by sonographer. Common bile duct: Diameter: 2 mm Liver: No focal lesion identified. Within normal limits in parenchymal echogenicity. Portal vein is patent on color Doppler imaging with normal direction of blood flow towards the liver. Other:  None. IMPRESSION: Normal right upper quadrant ultrasound. Electronically Signed   By: Ulyses Jarred M.D.   On: 08/18/2021 03:07   ? ? ?MDM ?Start IV ?LR Bolus x2 ?Antiemetic ?PPI ?Pain Medication ?Delorse Lek

## 2021-08-18 NOTE — MAU Note (Signed)
..  Danielle Evans is a 31 y.o. at 70w6dhere in MAU reporting: N/V, diarrhea, mid chest pain, upper ABD pain/spasm, and back pain/spasm. Pt returned to MAU from early this am for the same complaint that improved some with treatment, however, reports is much worse after 30 mins from being home. She states she has blood in her emesis and diarrhea with multiple episodes. Pt reports she is not able to control her bowels and wearing a pad now. She states not able to eat or drink anything. Pt denies VB, LOF, abnormal discharge, and no other complications in pregnancy.  ? ?Onset of complaint: 0600 ?Pain score: 10/10 ?Vitals:  ? 08/18/21 1923  ?BP: (!) 120/56  ?Pulse: (!) 108  ?Resp: 18  ?Temp: 98.9 ?F (37.2 ?C)  ?   ?FHT:168 ?Lab orders placed from triage:  ua ? ?

## 2021-08-18 NOTE — MAU Note (Signed)
IV phenergan order clarified with provider reviewed with pt- will remain in room to watch for pt complaints of reaction. ?

## 2021-08-18 NOTE — MAU Provider Note (Signed)
?History  ?  ? ?CSN: [id number] ? ?Arrival date and time: 08/18/21 1859 ? ? Event Date/Time  ? First Provider Initiated Contact with Patient 08/18/21 2041   ?  ? ?Chief Complaint  ?Patient presents with  ? Abdominal Pain  ? Nausea  ? Emesis  ? ?Danielle Evans is a 31 y.o. [id number] at 101w6dwho [facility]  She presents today for Abdominal Pain, Nausea, and Emesis.  Patient was seen earlier today with similar complaints.  She states that upon discharge, this morning, her pain returned within 30 minutes.  Patient states she is now experiencing diarrhea and has had 7-8 incidents today.  She continues to deny fever.  She states that the abdominal pain remains in her stomach and chest area with radiation to her back.  She describes it as a stabbing pain and experiences "spasms that worsen with laying down."  She reports having 3 incidents of vomiting since arrival and has not had anything to eat and drink today.  She questions if this is gastroenteritis or something else.  She states she has had similar symptoms approximately 10 years ago, but was never evaluated further than urgent care or the ER. ? ? ?OB History   ? ? Gravida  ?6  ? Para  ?1  ? Term  ?1  ? Preterm  ?   ? AB  ?4  ? Living  ?1  ?  ? ? SAB  ?3  ? IAB  ?   ? Ectopic  ?1  ? Multiple  ?   ? Live Births  ?   ?   ?  ?  ? ? ?Past Medical History:  ?Diagnosis Date  ? Anxiety   ? Attention and concentration deficit   ? Dysmenorrhea   ? Ectopic pregnancy   ? GERD (gastroesophageal reflux disease)   ? Headache   ? Migraines  ? [place]   ? re -biopsied nasal turbinate confirmed not cancer  ? Ovarian cyst   ? Pap smear abnormality of cervix 2014  ? Pneumonia   ? Sleep apnea   ? childhood, adenoidectomy  ? ? ?Past Surgical History:  ?Procedure Laterality Date  ? DILATION AND CURETTAGE OF UTERUS    ? LAPAROSCOPIC UNILATERAL SALPINGECTOMY Left 11/21/2015  ? Procedure: LAPAROSCOPIC UNILATERAL SALPINGECTOMY;  Surgeon: JWoodroe Mode MD;   [facility];  Service: Gynecology;  Laterality: Left;  ? LAPAROSCOPY N/A 11/21/2015  ? Procedure: LAPAROSCOPY DIAGNOSTIC;  Surgeon: JWoodroe Mode MD;  [facility];  Service: Gynecology;  Laterality: N/A;  ? NASAL SINUS SURGERY    ? TONSILECTOMY, ADENOIDECTOMY, BILATERAL MYRINGOTOMY AND TUBES    ? ? ?Family History  ?Problem Relation Age of Onset  ? Heart disease Father   ? Cancer Maternal Grandmother   ?     breast  ? Parkinsonism Maternal Grandfather   ? Cancer Paternal Aunt   ?     breast   ? ? ?Social History  ? ?Tobacco Use  ? Smoking status: Former  ?  Years: 3.00  ?  Types: Cigarettes  ?  Quit date: 10/23/2011  ?  Years since quitting: 9.8  ? Smokeless tobacco: Never  ?Vaping Use  ? Vaping Use: Never used  ?Substance Use Topics  ? Alcohol use: No  ?  Alcohol/week: 3.0 standard drinks  ?  Types: 3 Standard drinks or equivalent per week  ?  Comment: not with preg  ? Drug use: No  ?  Comment: hx of marijuana, not with preg  ? ? ?Allergies:  ?Allergies  ?Allergen Reactions  ? Ceclor [Cefaclor] Hives  ?  But has tolerated other cephalosporins, PCN, Augmetin  ? Morphine And Related Itching  ? Promethazine Nausea And Vomiting  ? ? ?Medications Prior to Admission  ?Medication Sig Dispense Refill Last Dose  ? calcium carbonate (TUMS - DOSED IN MG ELEMENTAL CALCIUM) 500 MG chewable tablet Chew 1 tablet by mouth daily.   08/18/2021  ? ondansetron (ZOFRAN) 4 MG tablet Take 1 tablet (4 mg total) by mouth every 8 (eight) hours as needed for nausea or vomiting. 20 tablet 1 08/18/2021  ? aspirin EC 81 MG tablet Take 1 tablet (81 mg total) by mouth daily. Take after 12 weeks for prevention of preeclampsia later in pregnancy 300 tablet 2   ? gabapentin (NEURONTIN) 100 MG capsule Take 100 mg by mouth at bedtime.     ? Prenatal Vit-Fe Fumarate-FA (PRENATAL MULTIVITAMIN) TABS tablet Take 1 tablet by mouth daily at 12 noon.     ? ? ?Review of Systems  ?Gastrointestinal:  Positive for abdominal pain, diarrhea, nausea and  vomiting.  ?Genitourinary:  Negative for difficulty urinating, dysuria, vaginal bleeding and vaginal discharge.  ?Musculoskeletal:  Positive for back pain.  ?Physical Exam  ? ?Blood pressure (!) 120/56, pulse (!) 108, temperature 98.9 ?F (37.2 ?C), temperature source Oral, resp. rate 18, height '5\' 7"'$  (1.702 m), weight 93.2 kg, last menstrual period 04/19/2021. ? ?Physical Exam ?Vitals reviewed.  ?Constitutional:   ?   General: She is in acute distress.  ?   Appearance: She is well-developed. She is ill-appearing.  ?HENT:  ?   Head: Normocephalic and atraumatic.  ?Eyes:  ?   Conjunctiva/sclera: Conjunctivae normal.  ?Cardiovascular:  ?   Rate and Rhythm: Normal rate and regular rhythm.  ?Pulmonary:  ?   Effort: Pulmonary effort is normal. No respiratory distress.  ?Abdominal:  ?   Tenderness: There is abdominal tenderness in the right upper quadrant, epigastric area and left upper quadrant.  ?Musculoskeletal:     ?   General: Normal range of motion.  ?   Cervical back: Normal range of motion.  ?Skin: ?   General: Skin is warm and dry.  ?Neurological:  ?   Mental Status: She is alert and oriented to person, place, and time.  ?Psychiatric:     ?   Mood and Affect: Mood normal.     ?   Behavior: Behavior normal.  ? ? ?MAU Course  ?Procedures ?Results for orders placed or performed during the hospital encounter of 08/18/21 (from the past 24 hour(s))  ?Urinalysis, Routine w reflex microscopic     Status: Abnormal  ? Collection Time: 08/18/21  7:33 PM  ?Result Value Ref Range  ? Color, Urine AMBER (A) YELLOW  ? APPearance CLOUDY (A) CLEAR  ? Specific Gravity, Urine 1.032 (H) 1.005 - 1.030  ? pH 5.0 5.0 - 8.0  ? Glucose, UA NEGATIVE NEGATIVE mg/dL  ? Hgb urine dipstick NEGATIVE NEGATIVE  ? Bilirubin Urine SMALL (A) NEGATIVE  ? Ketones, ur 80 (A) NEGATIVE mg/dL  ? Protein, ur 100 (A) NEGATIVE mg/dL  ? Nitrite NEGATIVE NEGATIVE  ? Leukocytes,Ua NEGATIVE NEGATIVE  ? RBC / HPF 0-5 0 - 5 RBC/hpf  ? WBC, UA 0-5 0 - 5 WBC/hpf  ?  Bacteria, UA RARE (A) NONE SEEN  ? Squamous Epithelial / LPF 11-20 0 - 5  ? Mucus PRESENT   ? Non Squamous  Epithelial 0-5 (A) NONE SEEN  ?CBC with Differential/Platelet     Status: None  ? Collection Time: 08/18/21  8:55 PM  ?Result Value Ref Range  ? WBC 8.3 4.0 - 10.5 K/uL  ? RBC 4.39 3.87 - 5.11 MIL/uL  ? Hemoglobin 13.3 12.0 - 15.0 g/dL  ? HCT 38.4 36.0 - 46.0 %  ? MCV 87.5 80.0 - 100.0 fL  ? MCH 30.3 26.0 - 34.0 pg  ? MCHC 34.6 30.0 - 36.0 g/dL  ? RDW 12.9 11.5 - 15.5 %  ? Platelets 199 150 - 400 K/uL  ? nRBC 0.0 0.0 - 0.2 %  ? Neutrophils Relative % 87 %  ? Neutro Abs 7.2 1.7 - 7.7 K/uL  ? Lymphocytes Relative 9 %  ? Lymphs Abs 0.8 0.7 - 4.0 K/uL  ? Monocytes Relative 3 %  ? Monocytes Absolute 0.3 0.1 - 1.0 K/uL  ? Eosinophils Relative 0 %  ? Eosinophils Absolute 0.0 0.0 - 0.5 K/uL  ? Basophils Relative 0 %  ? Basophils Absolute 0.0 0.0 - 0.1 K/uL  ? Immature Granulocytes 1 %  ? Abs Immature Granulocytes 0.04 0.00 - 0.07 K/uL  ?Comprehensive metabolic panel     Status: Abnormal  ? Collection Time: 08/18/21  8:55 PM  ?Result Value Ref Range  ? Sodium 135 135 - 145 mmol/L  ? Potassium 3.4 (L) 3.5 - 5.1 mmol/L  ? Chloride 106 98 - 111 mmol/L  ? CO2 22 22 - 32 mmol/L  ? Glucose, Bld 98 70 - 99 mg/dL  ? BUN 8 6 - 20 mg/dL  ? Creatinine, Ser 0.80 0.44 - 1.00 mg/dL  ? Calcium 8.5 (L) 8.9 - 10.3 mg/dL  ? Total Protein 6.0 (L) 6.5 - 8.1 g/dL  ? Albumin 3.4 (L) 3.5 - 5.0 g/dL  ? AST 15 15 - 41 U/L  ? ALT 12 0 - 44 U/L  ? Alkaline Phosphatase 43 38 - 126 U/L  ? Total Bilirubin 0.8 0.3 - 1.2 mg/dL  ? GFR, Estimated >60 >60 mL/min  ? Anion gap 7 5 - 15  ?Amylase     Status: None  ? Collection Time: 08/18/21  8:55 PM  ?Result Value Ref Range  ? Amylase 39 28 - 100 U/L  ? ? ?MDM ?Start IV ?LR Bolus ?Antiemetic ?PPI ?Pain Medication ?GI Cocktail ?PO Challenge ?Assessment and Plan  ?31 year old, [id number]  ?SIUP at 14.6 weeks ?N/V ?Abdominal Pain ? ?-Reviewed POC with patient. ?-Exam performed and findings discussed.   ?-Discussed usage of Phenergan complaint of vomiting.  Discussed how Phenergan can cause smooth muscle relaxation and ultimately improve complaint of spasms. Patient agreeable and states that previous symptoms, with use,

## 2021-08-18 NOTE — MAU Note (Signed)
Reaction resolved within minutes of stopping infusion. Provider in -medication regimen changed. Pt agreeable ?

## 2021-08-19 DIAGNOSIS — K529 Noninfective gastroenteritis and colitis, unspecified: Secondary | ICD-10-CM

## 2021-08-19 DIAGNOSIS — Z3A15 15 weeks gestation of pregnancy: Secondary | ICD-10-CM

## 2021-08-19 MED ORDER — ALUM & MAG HYDROXIDE-SIMETH 200-200-20 MG/5ML PO SUSP
30.0000 mL | Freq: Once | ORAL | Status: AC
  Administered 2021-08-19: 30 mL via ORAL
  Filled 2021-08-19: qty 30

## 2021-08-19 MED ORDER — LIDOCAINE VISCOUS HCL 2 % MT SOLN
15.0000 mL | Freq: Once | OROMUCOSAL | Status: AC
Start: 2021-08-19 — End: 2021-08-19
  Administered 2021-08-19: 15 mL via ORAL
  Filled 2021-08-19: qty 15

## 2021-08-19 NOTE — MAU Note (Signed)
Ginger ale and graham crackers provided for po challenge after GI cocktail. ?

## 2021-08-19 NOTE — Discharge Instructions (Signed)

## 2021-08-30 ENCOUNTER — Ambulatory Visit (INDEPENDENT_AMBULATORY_CARE_PROVIDER_SITE_OTHER): Payer: Medicaid Other | Admitting: Advanced Practice Midwife

## 2021-08-30 ENCOUNTER — Encounter: Payer: Medicaid Other | Admitting: Advanced Practice Midwife

## 2021-08-30 VITALS — BP 112/71 | HR 80 | Wt 204.0 lb

## 2021-08-30 DIAGNOSIS — Z3480 Encounter for supervision of other normal pregnancy, unspecified trimester: Secondary | ICD-10-CM

## 2021-08-30 DIAGNOSIS — Z3A16 16 weeks gestation of pregnancy: Secondary | ICD-10-CM

## 2021-08-30 NOTE — Progress Notes (Signed)
? ?  PRENATAL VISIT NOTE ? ?Subjective:  ?Danielle Evans is a 31 y.o. [id number] at 67w4dbeing seen today for ongoing prenatal care.  She is currently monitored for the following issues for this low-risk pregnancy and has Attention and concentration deficit and Supervision of other normal pregnancy, antepartum on their problem list. ? ?Patient reports no complaints.  Contractions: Not present. Vag. Bleeding: None.  Movement: Present. Denies leaking of fluid.  ? ?The following portions of the patient's history were reviewed and updated as appropriate: allergies, current medications, past family history, past medical history, past social history, past surgical history and problem list. Problem list updated. ? ?Objective:  ? ?Vitals:  ? 08/30/21 1009  ?BP: 112/71  ?Pulse: 80  ?Weight: 204 lb (92.5 kg)  ? ? ?Fetal Status: Fetal Heart Rate (bpm): 150   Movement: Present    ? ?General:  Alert, oriented and cooperative. Patient is in no acute distress.  ?Skin: Skin is warm and dry. No rash noted.   ?Cardiovascular: Normal heart rate noted  ?Respiratory: Normal respiratory effort, no problems with respiration noted  ?Abdomen: Soft, gravid, appropriate for gestational age.  Pain/Pressure: Absent     ?Pelvic: Cervical exam deferred        ?Extremities: Normal range of motion.  Edema: None  ?Mental Status: Normal mood and affect. Normal behavior. Normal judgment and thought content.  ? ?Assessment and Plan:  ?Pregnancy: [id number] 116w4d ?1. Supervision of other normal pregnancy, antepartum ?- Routine care ?- Discussed AFP, declined today, will consider next visit based on anatomy scan (05/22) ? ?2. [redacted] weeks gestation of pregnancy ? ? ?Preterm labor symptoms and general obstetric precautions including but not limited to vaginal bleeding, contractions, leaking of fluid and fetal movement were reviewed in detail with the patient. ?Please refer to After Visit Summary for other counseling recommendations.  ? ? ?Future Appointments   ?Date Time Provider [place]?09/17/2021 10:30 AM [facility] NURSE [facility]  ?09/17/2021 10:45 AM [facility] US5 [facility]  ?09/27/2021 10:15 AM WeDarlina RumpfCNM [facility]  ?10/25/2021 10:15 AM WeDarlina RumpfCNM [facility]  ?11/22/2021 10:15 AM WeDarlina RumpfCNM [facility]  ? ? ?SaDarlina RumpfCNM ? ?

## 2021-08-30 NOTE — Progress Notes (Signed)
ROB [redacted]w[redacted]d? ?AFP today  ?

## 2021-09-17 ENCOUNTER — Encounter: Payer: Self-pay | Admitting: *Deleted

## 2021-09-17 ENCOUNTER — Ambulatory Visit: Payer: Medicaid Other | Admitting: *Deleted

## 2021-09-17 ENCOUNTER — Ambulatory Visit: Payer: Medicaid Other | Attending: Obstetrics & Gynecology

## 2021-09-17 ENCOUNTER — Other Ambulatory Visit: Payer: Self-pay | Admitting: *Deleted

## 2021-09-17 VITALS — BP 102/49 | HR 80

## 2021-09-17 DIAGNOSIS — O99212 Obesity complicating pregnancy, second trimester: Secondary | ICD-10-CM | POA: Insufficient documentation

## 2021-09-17 DIAGNOSIS — Z6841 Body Mass Index (BMI) 40.0 and over, adult: Secondary | ICD-10-CM

## 2021-09-17 DIAGNOSIS — Z362 Encounter for other antenatal screening follow-up: Secondary | ICD-10-CM

## 2021-09-17 DIAGNOSIS — Z3A19 19 weeks gestation of pregnancy: Secondary | ICD-10-CM | POA: Insufficient documentation

## 2021-09-17 DIAGNOSIS — Z3A11 11 weeks gestation of pregnancy: Secondary | ICD-10-CM

## 2021-09-17 DIAGNOSIS — Z3689 Encounter for other specified antenatal screening: Secondary | ICD-10-CM

## 2021-09-17 DIAGNOSIS — Z3481 Encounter for supervision of other normal pregnancy, first trimester: Secondary | ICD-10-CM

## 2021-09-17 DIAGNOSIS — Z363 Encounter for antenatal screening for malformations: Secondary | ICD-10-CM | POA: Insufficient documentation

## 2021-09-26 ENCOUNTER — Encounter: Payer: Medicaid Other | Admitting: Family Medicine

## 2021-09-27 ENCOUNTER — Telehealth (INDEPENDENT_AMBULATORY_CARE_PROVIDER_SITE_OTHER): Payer: Medicaid Other | Admitting: Advanced Practice Midwife

## 2021-09-27 VITALS — BP 107/62 | HR 70

## 2021-09-27 DIAGNOSIS — O99342 Other mental disorders complicating pregnancy, second trimester: Secondary | ICD-10-CM

## 2021-09-27 DIAGNOSIS — G4700 Insomnia, unspecified: Secondary | ICD-10-CM

## 2021-09-27 DIAGNOSIS — Z3A20 20 weeks gestation of pregnancy: Secondary | ICD-10-CM

## 2021-09-27 DIAGNOSIS — Z3480 Encounter for supervision of other normal pregnancy, unspecified trimester: Secondary | ICD-10-CM

## 2021-09-27 MED ORDER — GABAPENTIN 100 MG PO CAPS
100.0000 mg | ORAL_CAPSULE | Freq: Every day | ORAL | 10 refills | Status: DC
Start: 2021-09-27 — End: 2022-11-05

## 2021-09-27 NOTE — Progress Notes (Signed)
OBSTETRICS PRENATAL VIRTUAL VISIT ENCOUNTER NOTE  Provider location: [facility] [place] [facility]   Patient location: Home  I connected with Danielle Evans on 09/27/21 at 10:15 AM EDT by MyChart Video Encounter and verified that I am speaking with the correct person using two identifiers. I discussed the limitations, risks, security and privacy concerns of performing an evaluation and management service virtually and the availability of in person appointments. I also discussed with the patient that there may be a patient responsible charge related to this service. The patient expressed understanding and agreed to proceed. Subjective:  Danielle Evans is a 30 y.o. [id number] at 85w4dbeing seen today for ongoing prenatal care.  She is currently monitored for the following issues for this low-risk pregnancy and has Attention and concentration deficit and Supervision of other normal pregnancy, antepartum on their problem list.  Patient reports no complaints.  Contractions: Not present. Vag. Bleeding: None.  Movement: Present. Denies any leaking of fluid.   The following portions of the patient's history were reviewed and updated as appropriate: allergies, current medications, past family history, past medical history, past social history, past surgical history and problem list.   Objective:   Vitals:   09/27/21 1004  BP: 107/62  Pulse: 70    Fetal Status:     Movement: Present     General:  Alert, oriented and cooperative. Patient is in no acute distress.  Respiratory: Normal respiratory effort, no problems with respiration noted  Mental Status: Normal mood and affect. Normal behavior. Normal judgment and thought content.  Rest of physical exam deferred due to type of encounter  Imaging: UKoreaMFM OB DETAIL +14 WK  Result Date: 09/17/2021 ----------------------------------------------------------------------  OBSTETRICS REPORT                       (Signed Final 09/17/2021  12:09 pm) ---------------------------------------------------------------------- Patient Info  ID #:       [id number]                         D.O.B.:  106-02-1991(30 yrs)  Name:       Danielle Evans                 Visit Date: 09/17/2021 11:40 am ---------------------------------------------------------------------- Performed By  Attending:        VJohnell ComingsMD         Ref. Address:     [address] Performed By:     FBenson [place]         [facility]  Referred By:      [facility] ---------------------------------------------------------------------- Orders  #  Description                           Code        Ordered By  1  [place] MFM OB DETAIL +14 [place]               76811.01    Verita Schneiders ----------------------------------------------------------------------  #  Order #                     Accession #                Episode #  1  [id number]                   [id number]                 [id number] ---------------------------------------------------------------------- Indications  Obesity complicating pregnancy, second         O99.212  trimester (BMI 33.04)  Encounter for antenatal screening for          Z36.3  malformations  [redacted] weeks gestation of pregnancy                Z3A.19  LR NIPS/Neg Horizon ---------------------------------------------------------------------- Fetal Evaluation  Num Of Fetuses:         1  Fetal Heart Rate(bpm):  145  Cardiac Activity:       Observed  Presentation:           Breech  Placenta:               Posterior  P. Cord Insertion:      Visualized, central  Amniotic Fluid  AFI FV:      Within normal limits                              Largest Pocket(cm)                               3.5 ---------------------------------------------------------------------- Biometry  BPD:      44.5  mm     G. Age:  19w 3d         64  %    CI:        71.27   %    70 - 86                                                          FL/HC:      19.3   %    16.1 - 18.3  HC:      167.9  mm     G. Age:  19w 3d         58  %  HC/AC:      1.13        1.09 - 1.39  AC:      148.3  mm     G. Age:  20w 1d         76  %    FL/BPD:     72.8   %  FL:       32.4  mm     G. Age:  20w 1d         77  %    FL/AC:      21.8   %    20 - 24  HUM:      31.2  mm     G. Age:  20w 3d         83  %  CER:      19.5  mm     G. Age:  19w 0d         34  %  NFT:       3.8  mm  LV:        8.2  mm  CM:        4.5  mm  Est. FW:     327  gm    0 lb 12 oz      90  % ---------------------------------------------------------------------- OB History  Gravidity:    6         Term:   1         SAB:   3  Ectopic:      1        Living:  1 ---------------------------------------------------------------------- Gestational Age  LMP:           21w 4d        Date:  04/19/21                 EDD:   01/24/22  U/S Today:     19w 6d                                        EDD:   02/05/22  Best:          19w 1d     Det. By:  Danielle Chroman         EDD:   02/10/22                                      (06/25/21) ---------------------------------------------------------------------- Anatomy  Cranium:               Appears normal         Aortic Arch:            Appears normal  Cavum:                 Appears normal         Ductal Arch:            Appears normal  Ventricles:            Appears normal         Diaphragm:              Appears normal  Choroid Plexus:        Appears normal         Stomach:  Appears normal, left                                                                        sided  Cerebellum:            Appears normal         Abdomen:                Appears normal  Posterior Fossa:       Appears normal         Abdominal Wall:         Appears  nml (cord                                                                        insert, abd wall)  Nuchal Fold:           Appears normal         Cord Vessels:           Appears normal (3                                                                        vessel cord)  Face:                  Appears normal         Kidneys:                Appear normal                         (orbits and profile)  Lips:                  Appears normal         Bladder:                Appears normal  Thoracic:              Appears normal         Spine:                  Appears normal  Heart:                 Appears normal         Upper Extremities:      Appears normal                         (4CH, axis, and                         situs)  RVOT:  Appears normal         Lower Extremities:      Appears normal  LVOT:                  Not well visualized  Other:  Fetus appears to be a female. Nasal bone visualized. Hands and feet          not well visualized. VC, 3VV and 3VTV visualized. ---------------------------------------------------------------------- Cervix Uterus Adnexa  Cervix  Length:           4.02  cm.  Normal appearance by transabdominal scan.  Adnexa  No abnormality visualized. ---------------------------------------------------------------------- Comments  This patient was seen for a detailed fetal anatomy scan due  to maternal obesity with a BMI of 33.  She denies any significant past medical history and denies  any problems in her current pregnancy.  She had a cell free DNA test earlier in her pregnancy which  indicated a low risk for trisomy 9, 22, and 13. A female fetus is  predicted.  She was informed that the fetal growth and amniotic fluid  level were appropriate for her gestational age.  There were no obvious fetal anomalies noted on today's  ultrasound exam.  However, today's exam was limited due to  the fetal position.  The patient was informed that anomalies may be missed due  to technical  limitations. If the fetus is in a suboptimal position  or maternal habitus is increased, visualization of the fetus in  the maternal uterus may be impaired.  A follow-up exam was scheduled in 4 weeks to complete the  views of the fetal anatomy. ----------------------------------------------------------------------                   Johnell Comings, MD Electronically Signed Final Report   09/17/2021 12:09 pm ----------------------------------------------------------------------   Assessment and Plan:  Pregnancy: M0N4709 at 100w4d1. Supervision of other normal pregnancy, antepartum - Routine care - Appt for completion of fetal anatomy 06/23  2. Insomnia, unspecified type - Refilled existing rx - gabapentin (NEURONTIN) 100 MG capsule; Take 1 capsule (100 mg total) by mouth at bedtime.  Dispense: 30 capsule; Refill: 10  3. [redacted] weeks gestation of pregnancy   Preterm labor symptoms and general obstetric precautions including but not limited to vaginal bleeding, contractions, leaking of fluid and fetal movement were reviewed in detail with the patient. I discussed the assessment and treatment plan with the patient. The patient was provided an opportunity to ask questions and all were answered. The patient agreed with the plan and demonstrated an understanding of the instructions. The patient was advised to call back or seek an in-person office evaluation/[facility]. Please refer to After Visit Summary for other counseling recommendations.   I provided six minutes of face-to-face time during this encounter.   Future Appointments  Date Time Provider DSearcy 10/19/2021  9:[facility] 10/19/2021  9:45 AM [facility] 10/25/2021 10:15 AM WDarlina Rumpf CNM [facility]  11/22/2021  9:15 AM WDarlina Rumpf CNM [facility]    [place]arlina Rumpf CM[place]for  WDean Foods Company CAustwell

## 2021-09-27 NOTE — Progress Notes (Signed)
I connected with  Danielle Evans on 09/27/21 at 10:15 AM EDT by telephone and verified that I am speaking with the correct person using two identifiers.   I discussed the limitations, risks, security and privacy concerns of performing an evaluation and management service by telephone and the availability of in person appointments. I also discussed with the patient that there may be a patient responsible charge related to this service. The patient expressed understanding and agreed to proceed.  Crosby Oyster, RN 09/27/2021  10:03 AM   Would like to get a refill on gabapentin

## 2021-10-04 IMAGING — US US OB TRANSVAGINAL
1 series · 15 of 28 positions shown · non-contrast
Comparison: None.

CLINICAL DATA: Unknown location of pregnancy.

EXAM:
OBSTETRIC <14 WK US AND TRANSVAGINAL OB US
TECHNIQUE: Both transabdominal and transvaginal ultrasound examinations were
performed for complete evaluation of the gestation as well as the
maternal uterus, adnexal regions, and pelvic cul-de-sac.
Transvaginal technique was performed to assess early pregnancy.

[Series 1: us ob transvaginal · 15 of 35 slices shown]
[im 1/35]
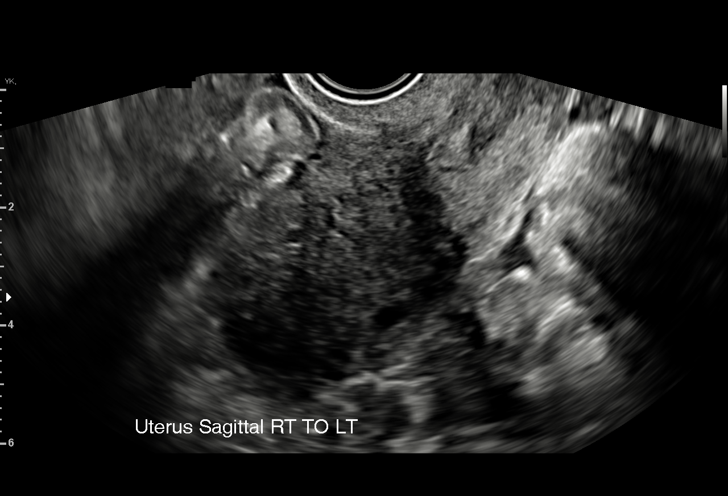
[im 3/35]
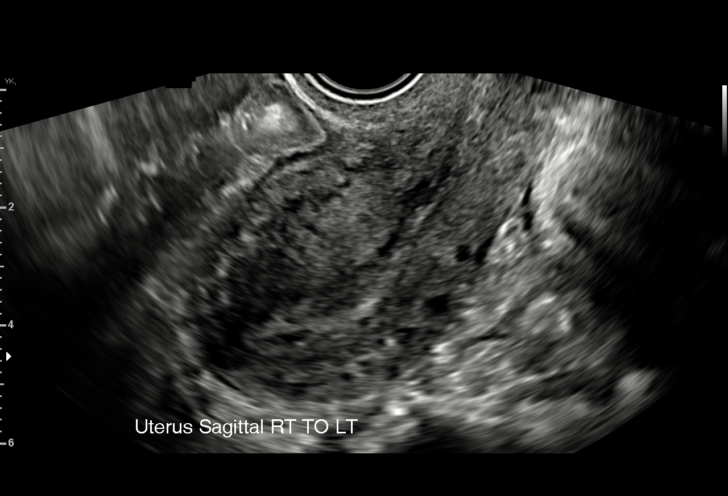
[im 6/35]
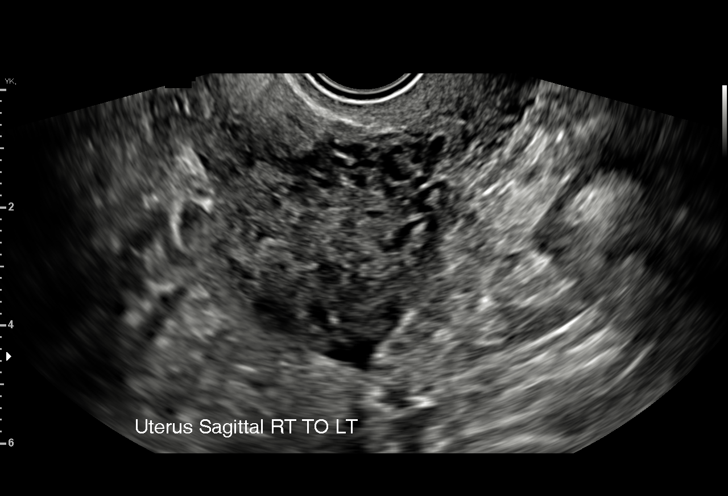
[im 8/35]
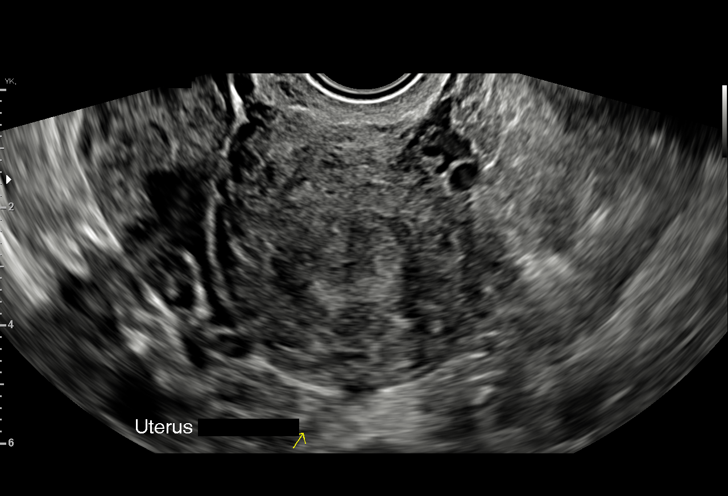
[im 11/35]
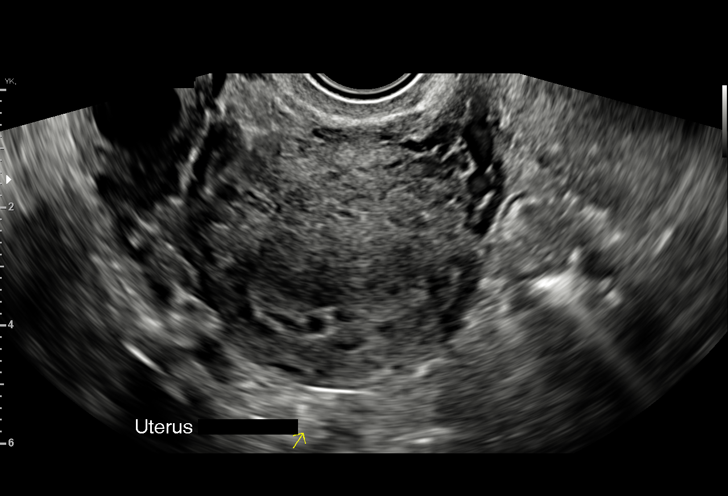
[im 13/35]
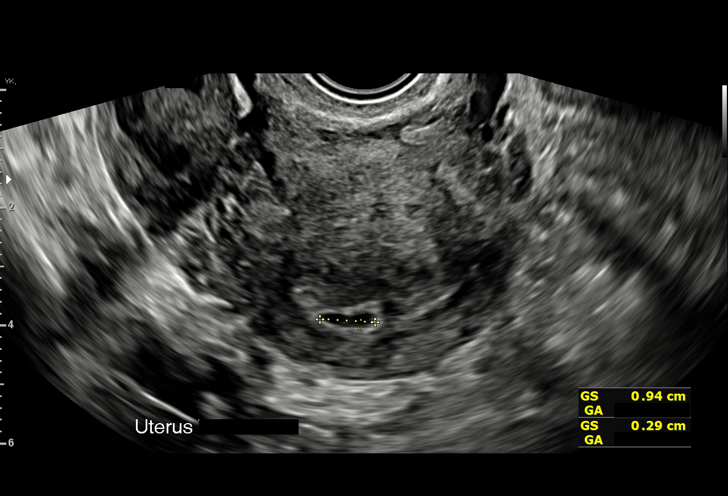
[im 16/35]
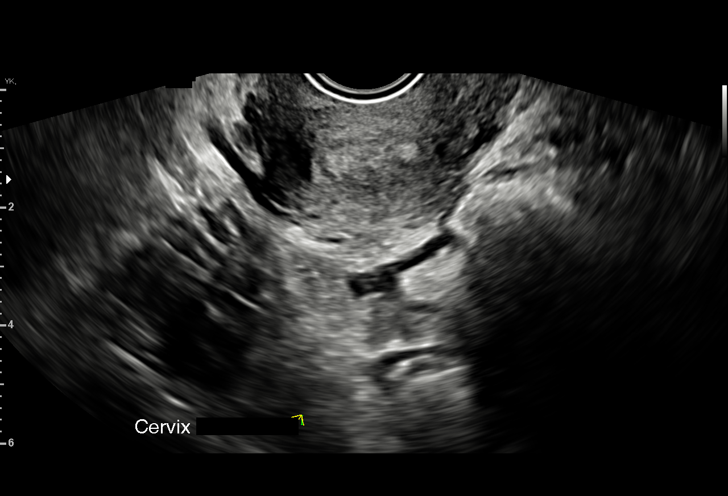
[im 18/35]
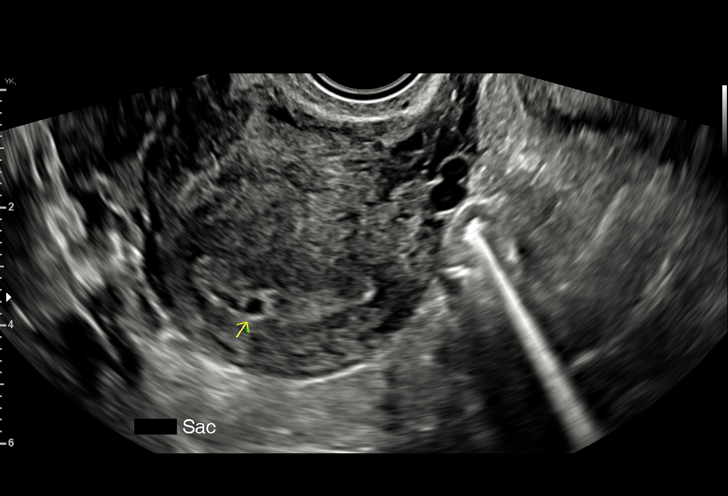
[im 19/35]
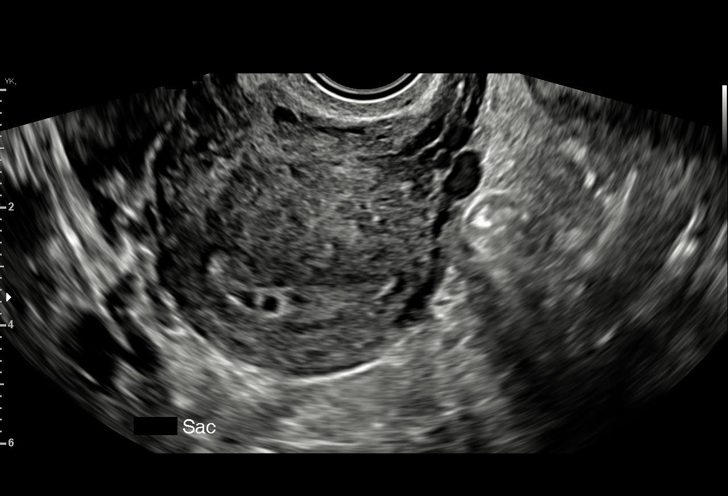
[im 22/35]
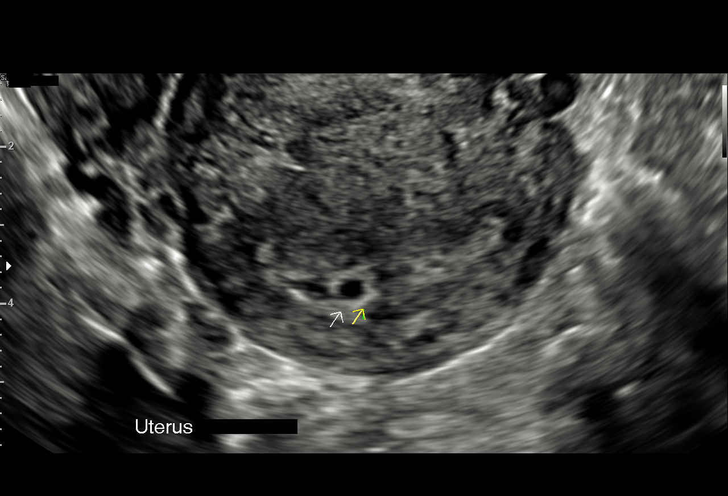
[im 24/35]
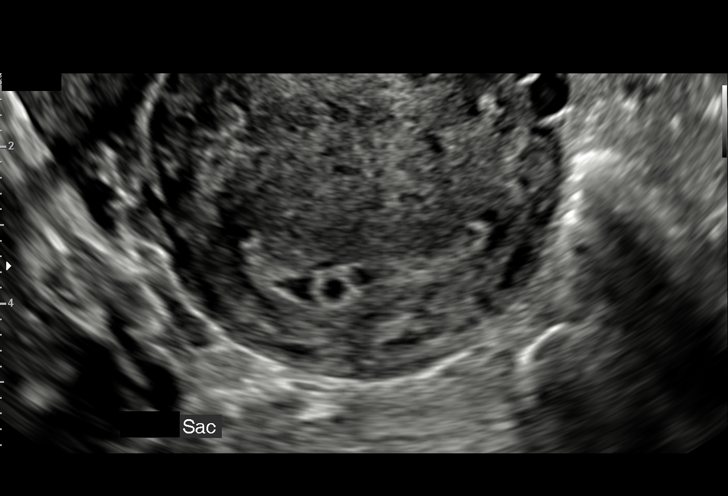
[im 27/35]
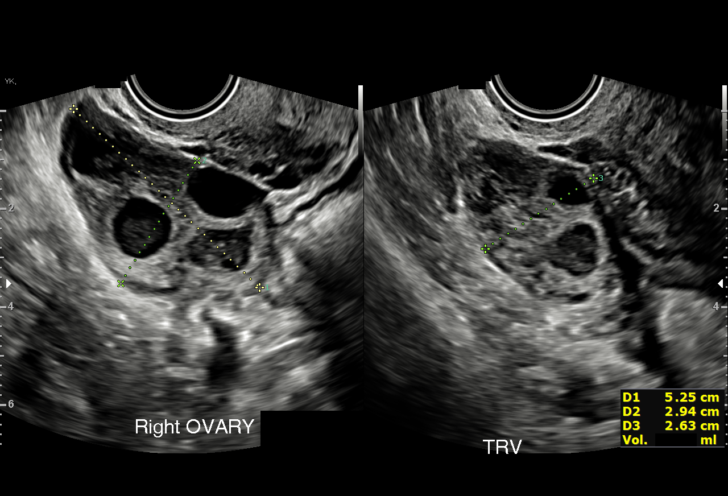
[im 29/35]
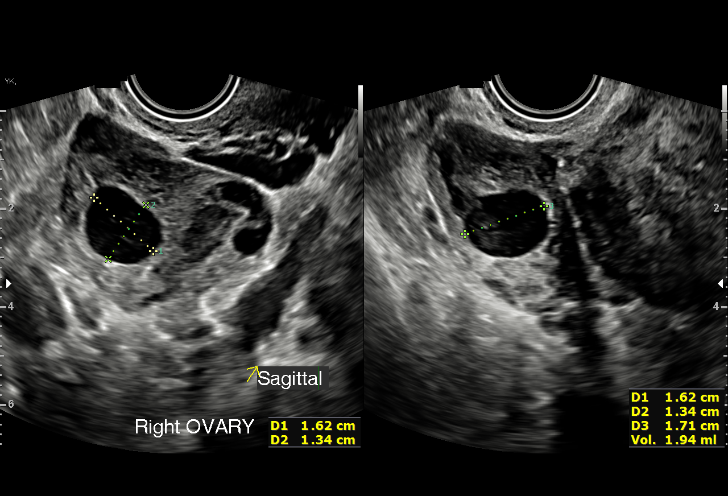
[im 32/35]
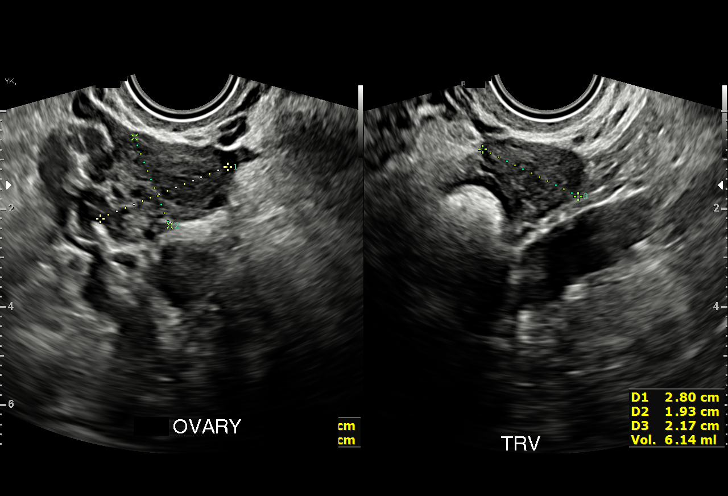
[im 35/35]
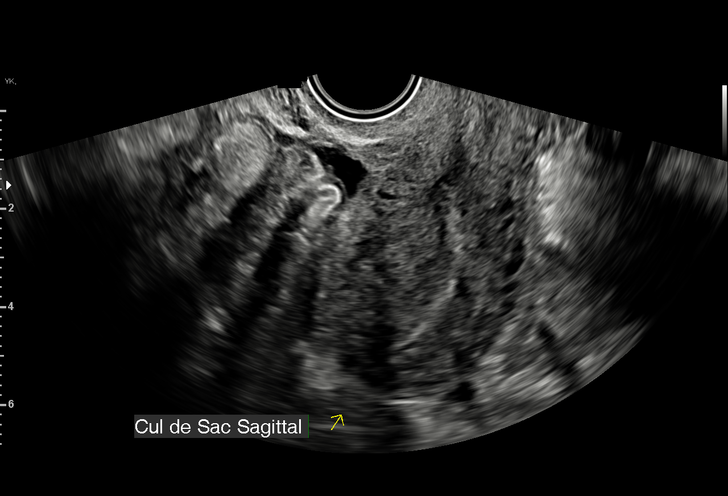

[15 of 28 positions shown; findings below may reference images not displayed]

FINDINGS: Intrauterine gestational sac: Single

Yolk sac:  Not Visualized.

Embryo:  Not Visualized.

Cardiac Activity: Not Visualized.

Heart Rate: N/A  bpm

MSD: 4 mm   5 w   1 d

Subchorionic hemorrhage:  Small

Maternal uterus/adnexae:

The right ovary measures 5.3 cm x 3.0 cm x 2.6 cm and contains
multiple small anechoic structures.

The left ovary measures 2.8 cm x 2.2 cm x 2.0 cm and is normal in
appearance.

A trace amount of pelvic free fluid is seen.
IMPRESSION: Single intrauterine gestational sac, at approximately 5 weeks and 1
day gestation by ultrasound evaluation, without visualization of a
yolk sac or fetal pole. While this may be secondary to early
intrauterine pregnancy, correlation with follow-up pelvic ultrasound
is recommended.

## 2021-10-09 IMAGING — US US OB TRANSVAGINAL
1 series · 15 of 28 positions shown · non-contrast
Comparison: 03/26/2020

CLINICAL DATA: Pregnancy of unknown anatomic location

EXAM:
TRANSVAGINAL OB ULTRASOUND
TECHNIQUE: Transvaginal ultrasound was performed for complete evaluation of the
gestation as well as the maternal uterus, adnexal regions, and
pelvic cul-de-sac.

[Series 1: us ob transvaginal · 15 of 77 slices shown]
[im 1/77]
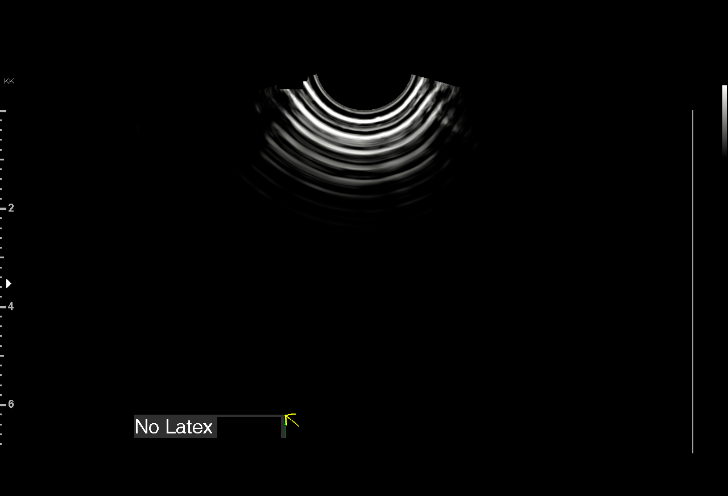
[im 6/77]
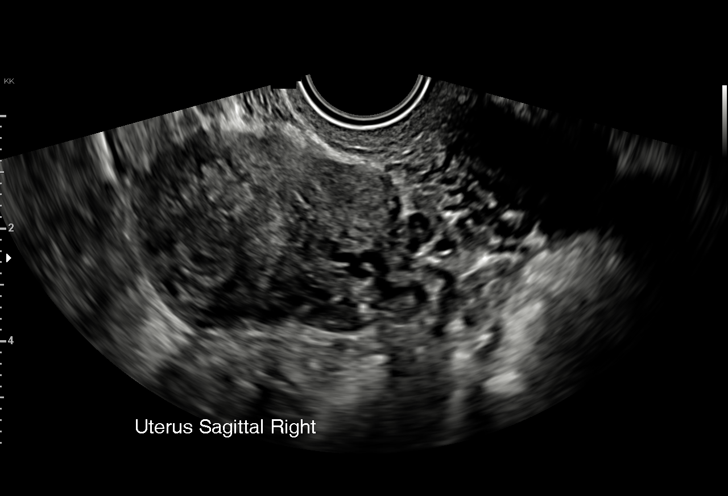
[im 12/77]
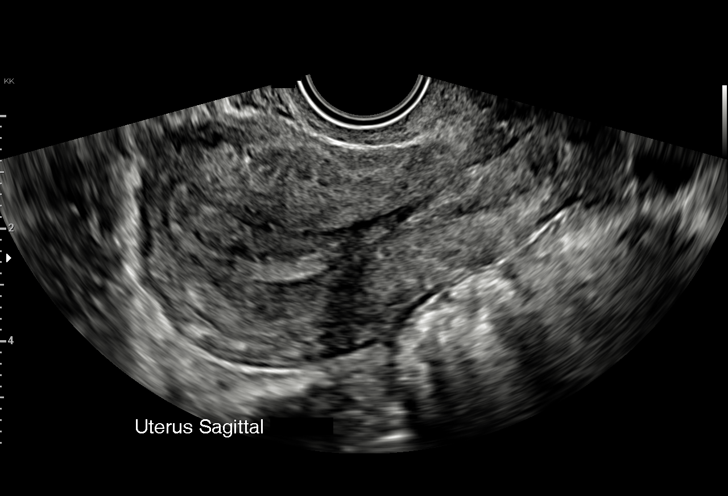
[im 17/77]
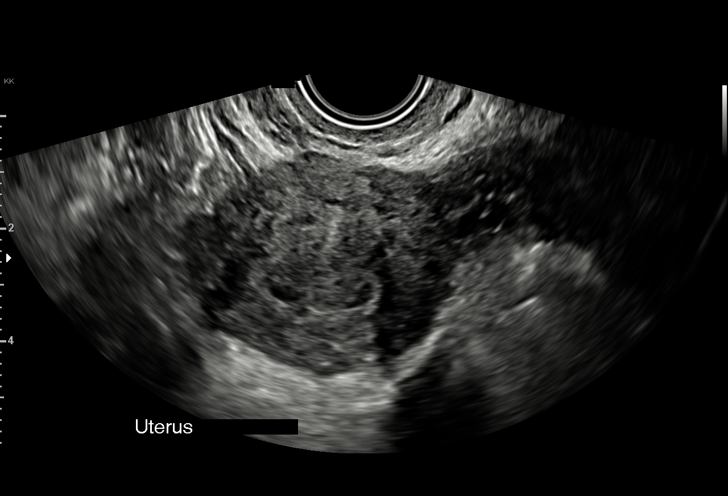
[im 23/77]
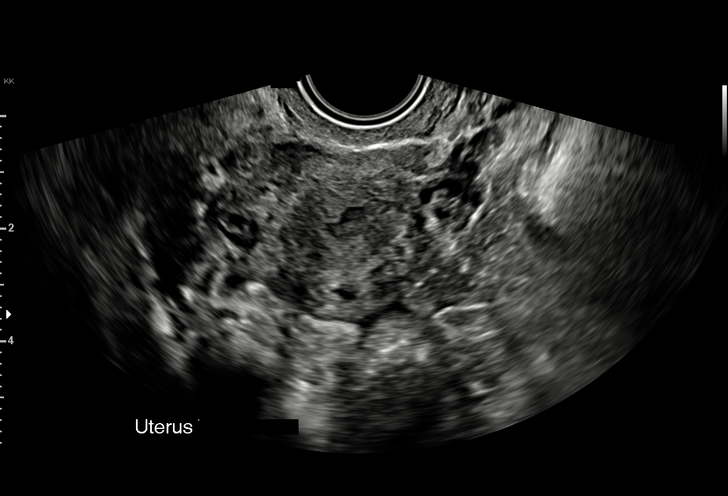
[im 29/77]
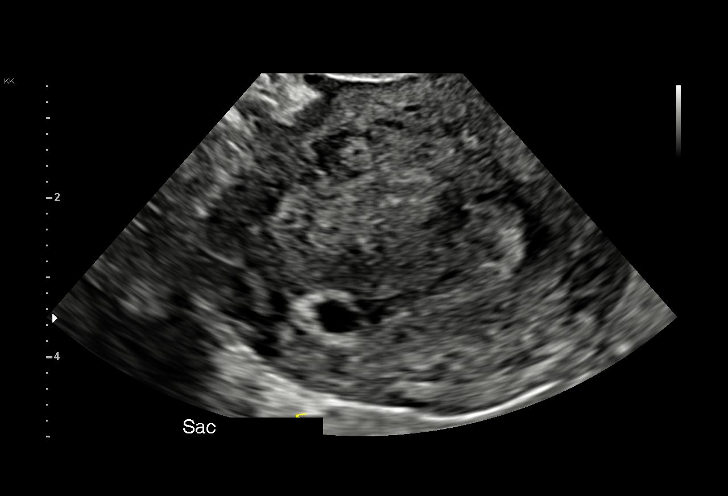
[im 34/77]
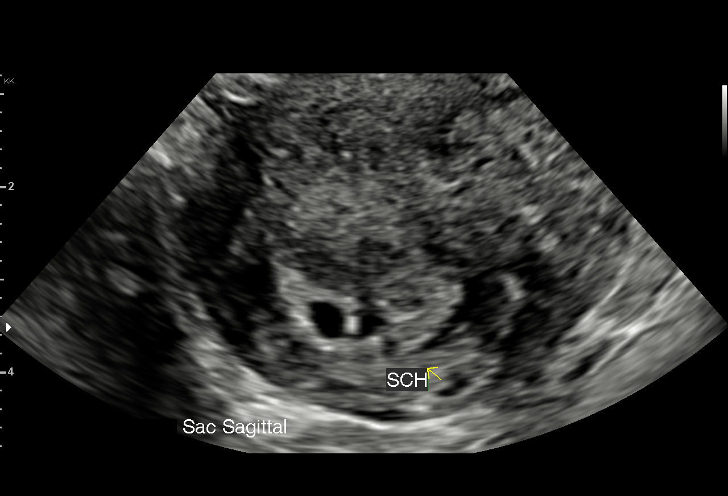
[im 40/77]
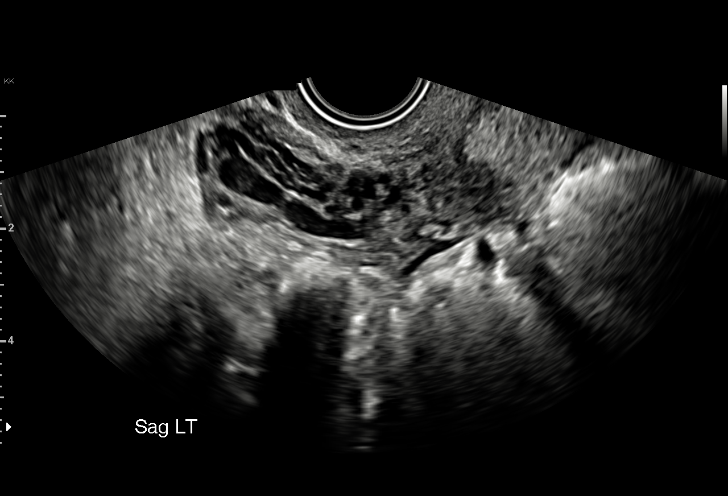
[im 43/77]
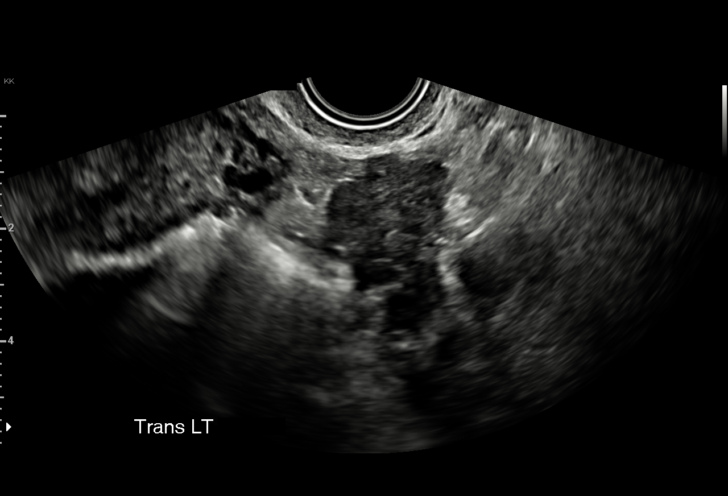
[im 48/77]
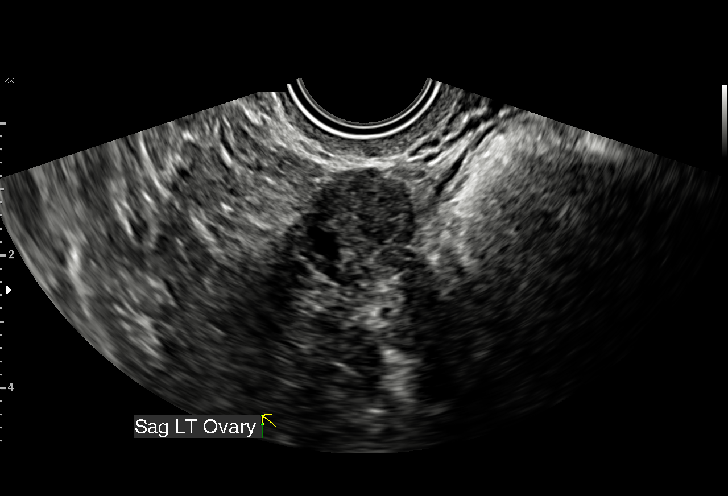
[im 54/77]
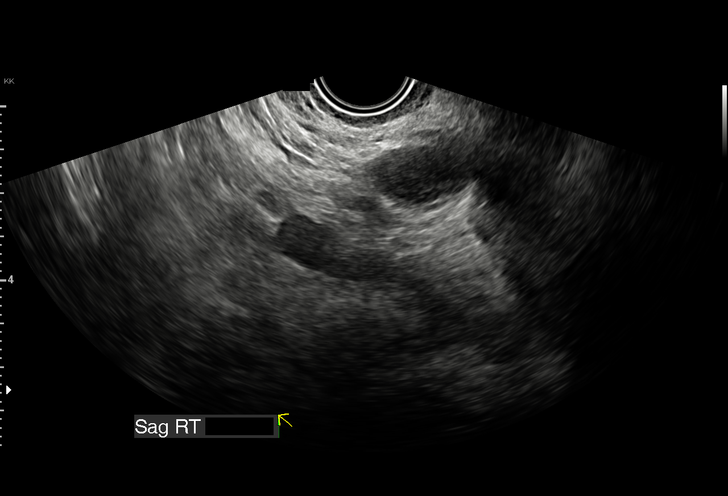
[im 60/77]
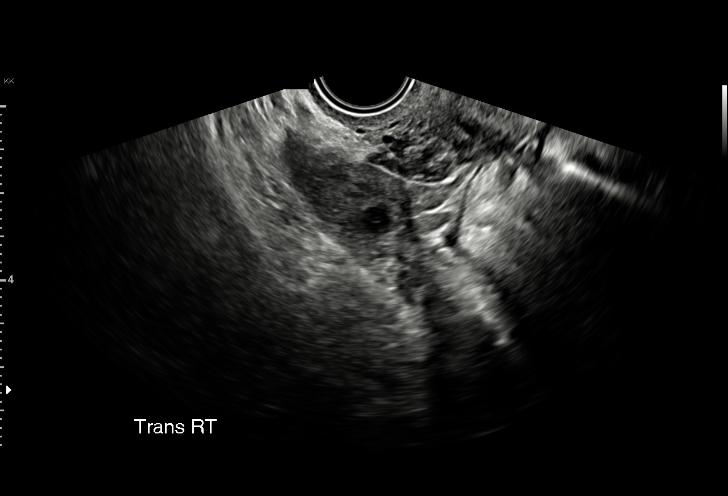
[im 65/77]
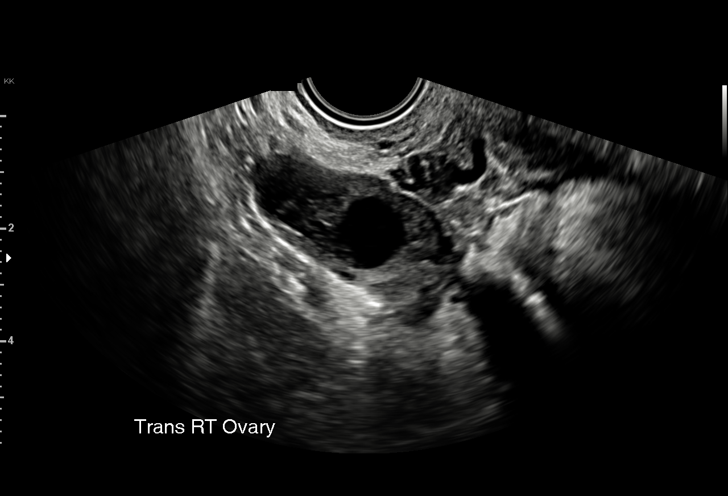
[im 71/77]
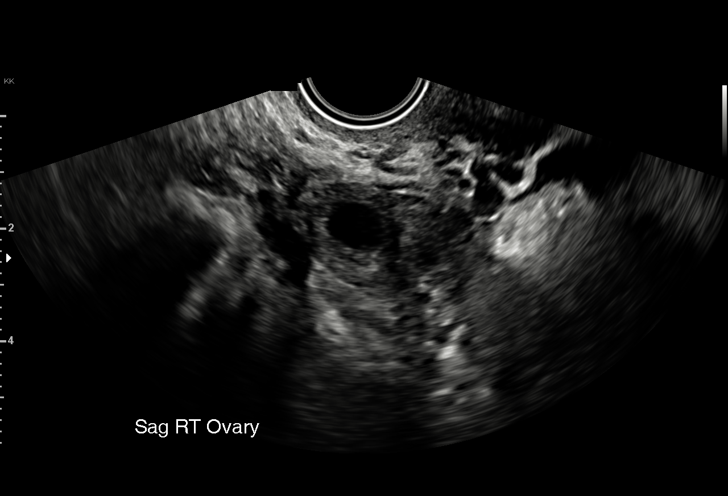
[im 77/77]
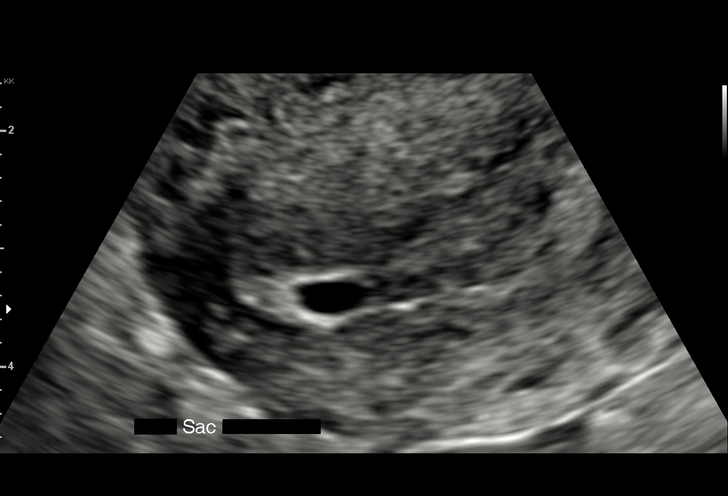

[15 of 28 positions shown; findings below may reference images not displayed]

FINDINGS: Intrauterine gestational sac: Present, single

Yolk sac:  Not identified

Embryo:  Not identified

Cardiac Activity: N/A

Heart Rate: N/A bpm

MSD: 4.4 mm   5 w   1 d

Subchorionic hemorrhage:  Small subchronic hemorrhage

Maternal uterus/adnexae:

Uterus anteverted, otherwise unremarkable.

LEFT ovary normal size and morphology, 1.8 x 1.7 x 1.6 cm.

RIGHT ovary normal size at 3.0 x 4.0 x 2.0 cm containing small
corpus luteal cyst.

Trace free pelvic fluid.

No adnexal masses.
IMPRESSION: Tiny intrauterine gestational sac at fundus corresponding to 5 weeks
1 day EGA by mean sac diameter.

No fetal pole identified to establish viability; may consider
follow-up ultrasound in 14 days if clinically indicated to establish
viability.

## 2021-10-15 IMAGING — US US OB TRANSVAGINAL
1 series · 15 of 28 positions shown · non-contrast
Comparison: Six days ago

CLINICAL DATA: Pregnancy of unknown location

EXAM:
TRANSVAGINAL OB ULTRASOUND
TECHNIQUE: Transvaginal ultrasound was performed for complete evaluation of the
gestation as well as the maternal uterus, adnexal regions, and
pelvic cul-de-sac.

[Series 1: us ob transvaginal · 65 acquisitions, 15 frames shown]
[im 1/65]
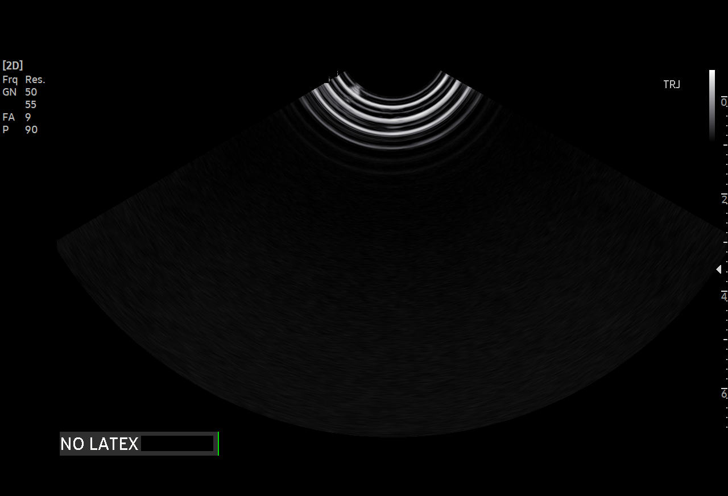
[im 5/65]
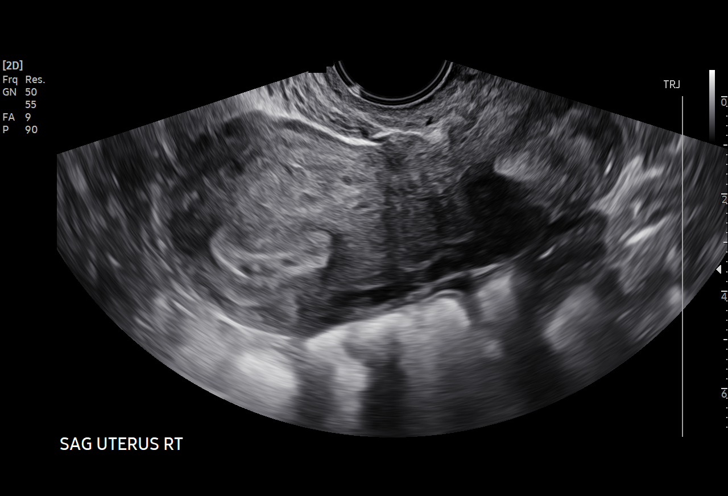
[im 10/65]
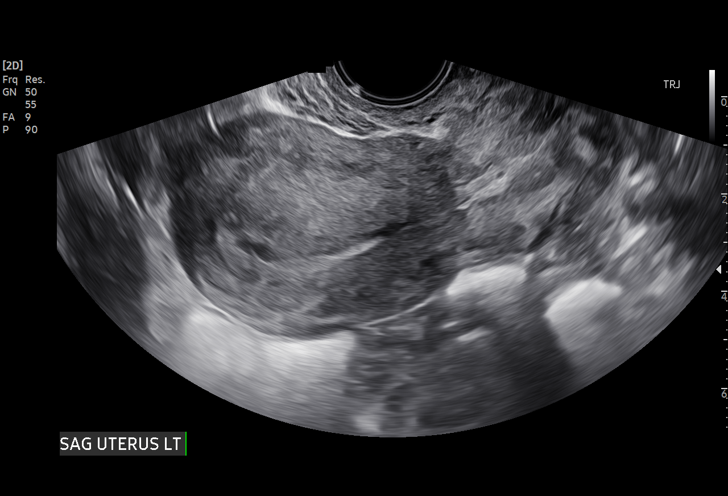
[im 15/65]
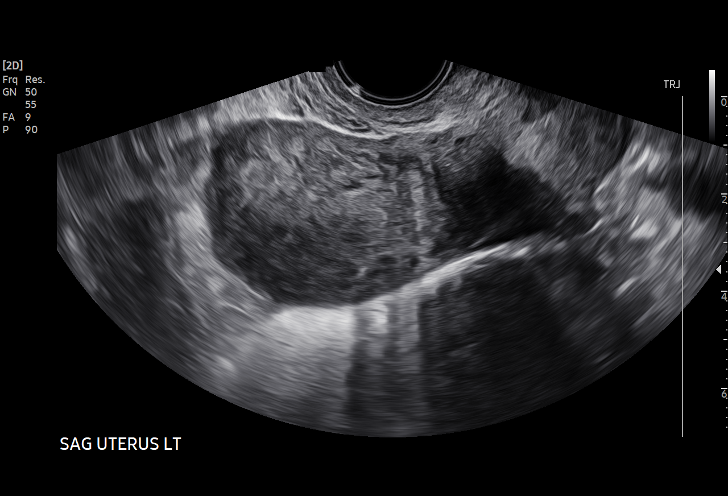
[im 19/65]
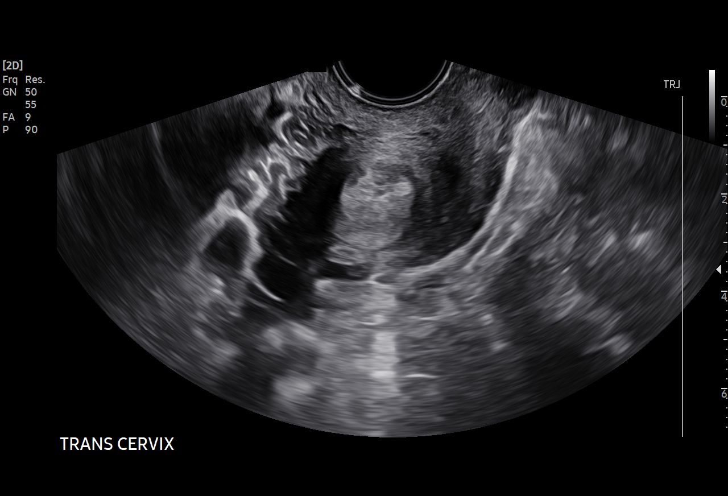
[im 24/65]
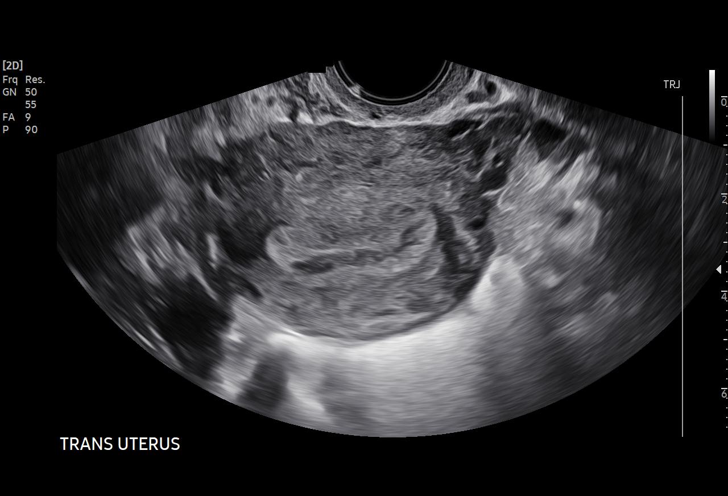
[im 29/65]
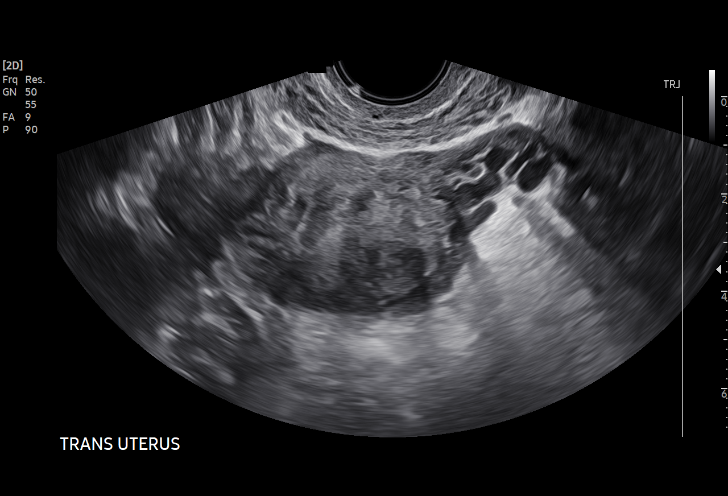
[im 34/65]
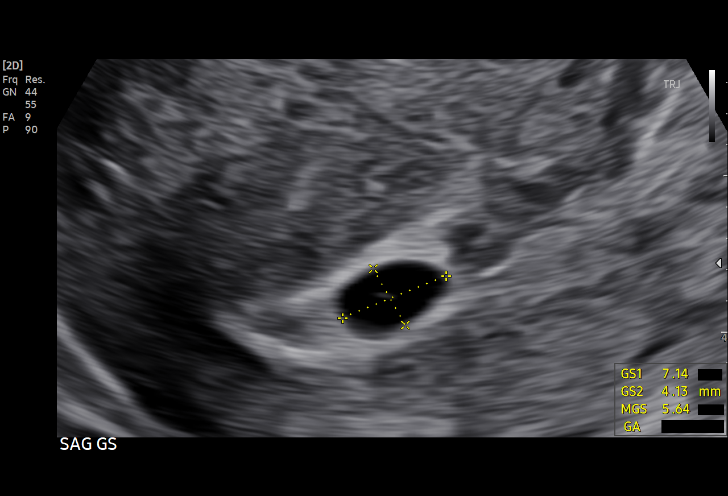
[im 36/65]
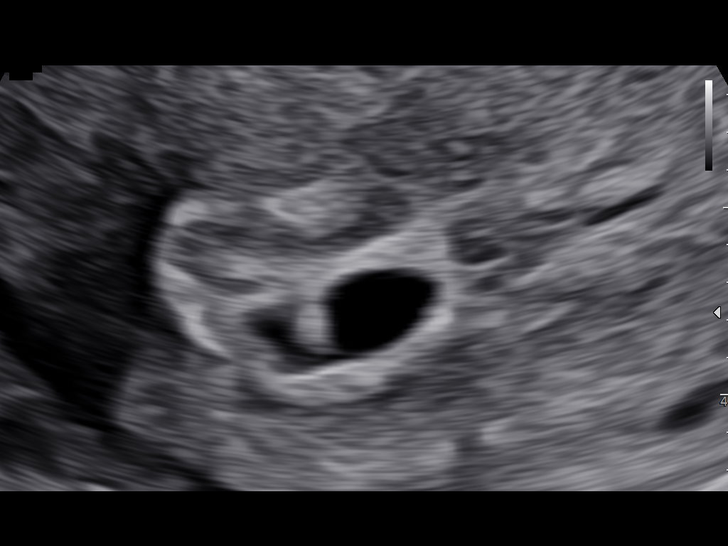
[im 41/65]
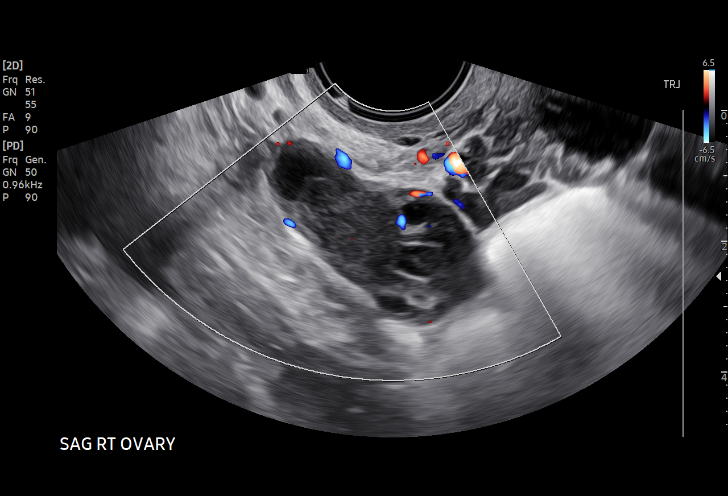
[im 46/65]
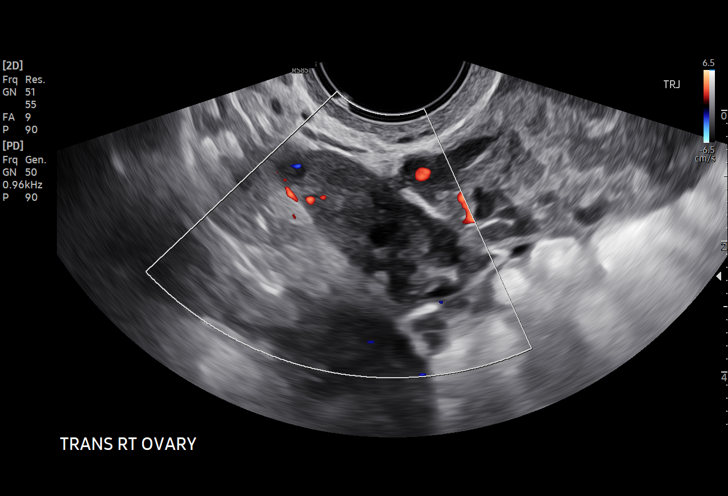
[im 50/65]
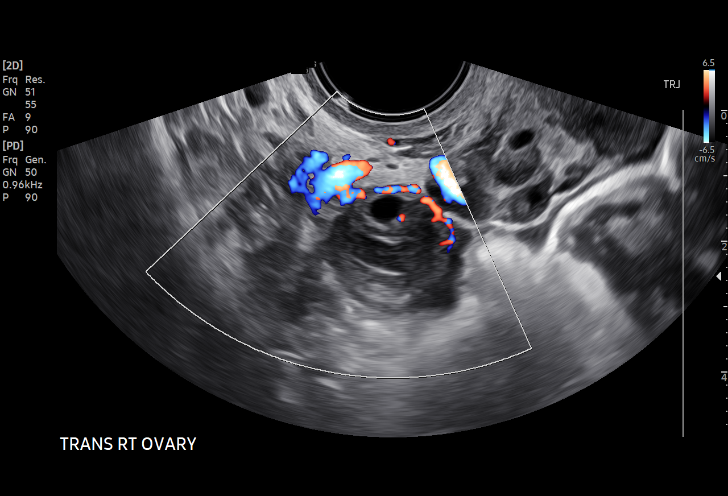
[im 55/65]
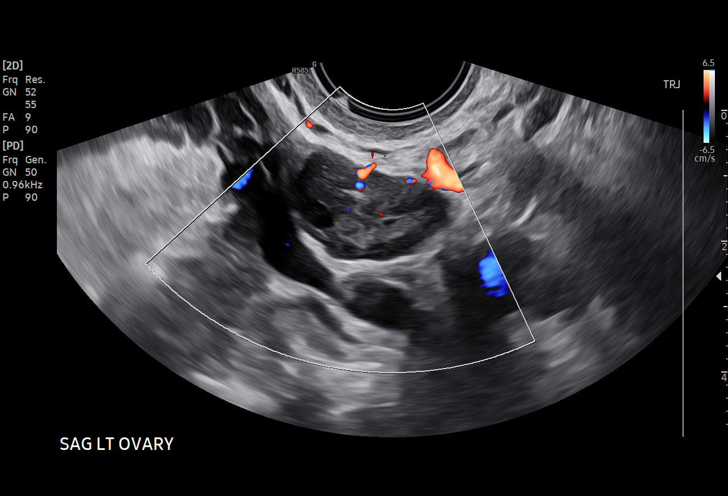
[im 60/65]
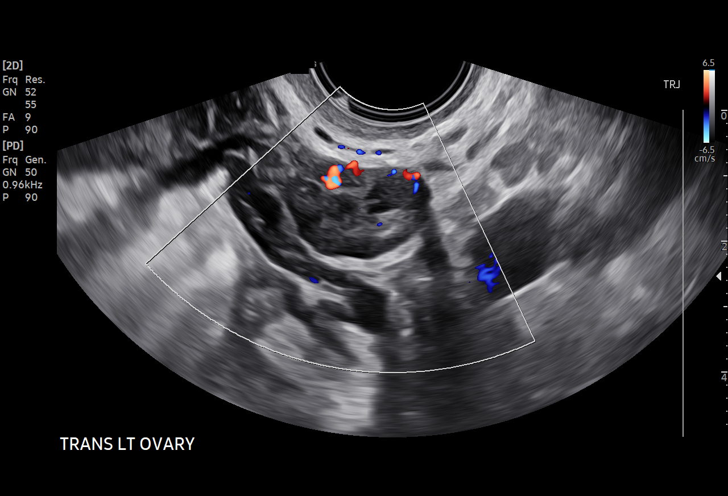
[im 65/65]
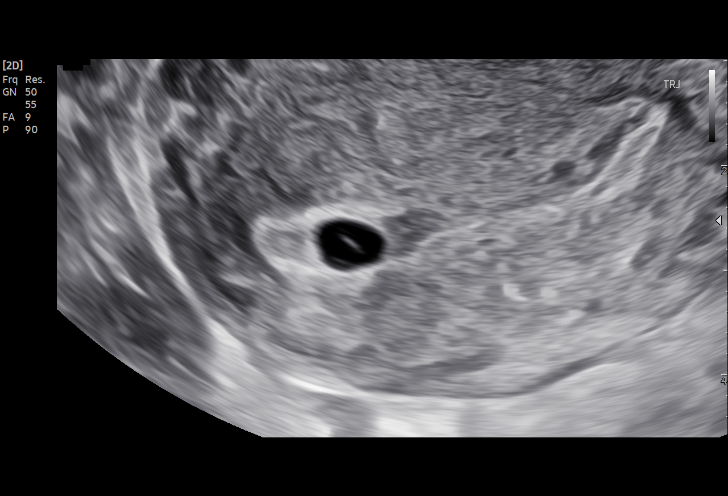

[15 of 28 positions shown; findings below may reference images not displayed]

FINDINGS: Intrauterine gestational sac: Single

Yolk sac:  Visualized.

Embryo:  Not Visualized.

MSD: 6.1 mm   5 w   2 d

Subchorionic hemorrhage:  None visualized.

Maternal uterus/adnexae: Corpus luteum on the right. No pelvic fluid
or adnexal mass
IMPRESSION: Single intrauterine gestational sac with yolk sac. Gestational age
is 5 weeks 2 days by mean sac diameter. An embryo is not yet
visible.

## 2021-10-19 ENCOUNTER — Ambulatory Visit: Payer: Medicaid Other

## 2021-10-20 ENCOUNTER — Encounter: Payer: Self-pay | Admitting: Advanced Practice Midwife

## 2021-10-24 ENCOUNTER — Ambulatory Visit: Payer: Medicaid Other | Admitting: *Deleted

## 2021-10-24 ENCOUNTER — Ambulatory Visit: Payer: Medicaid Other | Attending: Obstetrics

## 2021-10-24 DIAGNOSIS — O358XX0 Maternal care for other (suspected) fetal abnormality and damage, not applicable or unspecified: Secondary | ICD-10-CM

## 2021-10-24 DIAGNOSIS — Z3A24 24 weeks gestation of pregnancy: Secondary | ICD-10-CM

## 2021-10-24 DIAGNOSIS — O99212 Obesity complicating pregnancy, second trimester: Secondary | ICD-10-CM

## 2021-10-24 DIAGNOSIS — E669 Obesity, unspecified: Secondary | ICD-10-CM

## 2021-10-24 DIAGNOSIS — Z6841 Body Mass Index (BMI) 40.0 and over, adult: Secondary | ICD-10-CM | POA: Insufficient documentation

## 2021-10-24 DIAGNOSIS — Z362 Encounter for other antenatal screening follow-up: Secondary | ICD-10-CM | POA: Diagnosis present

## 2021-10-25 ENCOUNTER — Encounter: Payer: Medicaid Other | Admitting: Advanced Practice Midwife

## 2021-11-01 ENCOUNTER — Other Ambulatory Visit: Payer: Self-pay | Admitting: *Deleted

## 2021-11-01 ENCOUNTER — Encounter: Payer: Self-pay | Admitting: Advanced Practice Midwife

## 2021-11-01 ENCOUNTER — Encounter: Payer: Medicaid Other | Admitting: Obstetrics and Gynecology

## 2021-11-01 DIAGNOSIS — F32A Depression, unspecified: Secondary | ICD-10-CM

## 2021-11-05 ENCOUNTER — Encounter: Payer: Medicaid Other | Admitting: Licensed Clinical Social Worker

## 2021-11-09 ENCOUNTER — Encounter: Payer: Medicaid Other | Admitting: Licensed Clinical Social Worker

## 2021-11-22 ENCOUNTER — Other Ambulatory Visit (HOSPITAL_COMMUNITY)
Admission: RE | Admit: 2021-11-22 | Discharge: 2021-11-22 | Disposition: A | Discharge status: Home / Self Care | Payer: Medicaid Other | Source: Ambulatory Visit | Attending: Advanced Practice Midwife | Admitting: Advanced Practice Midwife

## 2021-11-22 ENCOUNTER — Encounter: Payer: Medicaid Other | Admitting: Advanced Practice Midwife

## 2021-11-22 ENCOUNTER — Ambulatory Visit (INDEPENDENT_AMBULATORY_CARE_PROVIDER_SITE_OTHER): Payer: Medicaid Other | Admitting: Advanced Practice Midwife

## 2021-11-22 VITALS — BP 97/60 | HR 83 | Wt 218.0 lb

## 2021-11-22 DIAGNOSIS — N898 Other specified noninflammatory disorders of vagina: Secondary | ICD-10-CM

## 2021-11-22 DIAGNOSIS — O26893 Other specified pregnancy related conditions, third trimester: Secondary | ICD-10-CM | POA: Diagnosis present

## 2021-11-22 DIAGNOSIS — O99342 Other mental disorders complicating pregnancy, second trimester: Secondary | ICD-10-CM

## 2021-11-22 DIAGNOSIS — Z3A28 28 weeks gestation of pregnancy: Secondary | ICD-10-CM | POA: Diagnosis not present

## 2021-11-22 DIAGNOSIS — K2960 Other gastritis without bleeding: Secondary | ICD-10-CM

## 2021-11-22 DIAGNOSIS — F419 Anxiety disorder, unspecified: Secondary | ICD-10-CM

## 2021-11-22 DIAGNOSIS — O99343 Other mental disorders complicating pregnancy, third trimester: Secondary | ICD-10-CM

## 2021-11-22 DIAGNOSIS — Z3480 Encounter for supervision of other normal pregnancy, unspecified trimester: Secondary | ICD-10-CM

## 2021-11-22 MED ORDER — OMEPRAZOLE MAGNESIUM 20 MG PO TBEC: 20.0000 mg | DELAYED_RELEASE_TABLET | Freq: Every day | ORAL | 0 refills | Status: DC

## 2021-11-22 MED ORDER — HYDROXYZINE HCL 25 MG PO TABS: 25.0000 mg | ORAL_TABLET | Freq: Four times a day (QID) | ORAL | 2 refills | Status: DC | PRN

## 2021-11-22 MED ORDER — FAMOTIDINE 10 MG PO TABS: 10.0000 mg | ORAL_TABLET | Freq: Two times a day (BID) | ORAL | 0 refills | Status: DC

## 2021-11-22 NOTE — Progress Notes (Signed)
PRENATAL VISIT NOTE  Subjective:  Danielle Evans is a 31 y.o. D3U2025 at 33w4dbeing seen today for ongoing prenatal care.  She is currently monitored for the following issues for this low-risk pregnancy and has Attention and concentration deficit and Supervision of other normal pregnancy, antepartum on their problem list.  Patient reports  ongoing difficulty with anxiety secondary to major life milestones and changes including son starting day care, complications related to planned move (now living in AirBnB), new job starting next month . Patient overall feels things are going well but she has many things in flux and occasionally feels overwhelmed. Denies SI, HI, IPV but would like to start medication for anxiety and was previously successful with Vistaril.  Contractions: Irritability. Vag. Bleeding: None.  Movement: Present. Denies leaking of fluid.   The following portions of the patient's history were reviewed and updated as appropriate: allergies, current medications, past family history, past medical history, past social history, past surgical history and problem list. Problem list updated.  Objective:   Vitals:   11/22/21 0933  BP: 97/60  Pulse: 83  Weight: 218 lb (98.9 kg)    Fetal Status: Fetal Heart Rate (bpm): 140   Movement: Present     General:  Alert, oriented and cooperative. Patient is in no acute distress.  Skin: Skin is warm and dry. No rash noted.   Cardiovascular: Normal heart rate noted  Respiratory: Normal respiratory effort, no problems with respiration noted  Abdomen: Soft, gravid, appropriate for gestational age.  Pain/Pressure: Present     Pelvic: Cervical exam deferred        Extremities: Normal range of motion.  Edema: None  Mental Status: Normal mood and affect. Normal behavior. Normal judgment and thought content.   Assessment and Plan:  Pregnancy: GK2H0623at 241w4d1. Supervision of other normal pregnancy, antepartum - Routine care - Glucose  Tolerance, 2 Hours w/1 Hour - CBC - RPR - HIV Antibody (routine testing w rflx) - SPECIAL REPORTS OR FORMS FORMS15  2. Vaginal discharge during pregnancy in third trimester - Self swab - Cervicovaginal ancillary only( )  3. 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 Anxiety - New rx based on previous positive response - hydrOXYzine (ATARAX) 25 MG tablet; Take 1 tablet (25 mg total) by mouth every 6 (six) hours as needed for itching.  Dispense: 30 tablet; Refill: 2  5. Reflux gastritis - Diet changes as initial intervention - omeprazole (PRILOSEC OTC) 20 MG tablet; Take 1 tablet (20 mg total) by mouth daily.  Dispense: 30 tablet; Refill: 0 - famotidine (PEPCID) 10 MG tablet; Take 1 tablet (10 mg total) by mouth 2 (two) times daily.  Dispense: 60 tablet; Refill: 0  6. [redacted] weeks gestation of pregnancy   Preterm labor symptoms and general obstetric precautions including but not limited to vaginal bleeding, contractions, leaking of fluid and fetal movement were reviewed in detail with the patient. Please refer to After Visit Summary for other counseling recommendations.    Future Appointments  Date Time Provider DeCandler7/28/2023  9:00 AM [facility] None  12/06/2021 10:35 AM WeDarlina RumpfCNM [facility]  12/20/2021 10:35 AM WeDarlina RumpfCNM [facility]  01/03/2022  9:35 AM WeDarlina RumpfCNM [facility]  01/17/2022  9:35 AM WeDarlina RumpfCNM [facility]  01/24/2022  8:35 AM AnHarolyn RutherfordUgSallyanne HaversMD [facility]  01/31/2022  9:35 AM Darlina Rumpf, CNM [facility]  02/07/2022  8:35 AM Harolyn Rutherford, Sallyanne Havers, MD [facility]  02/14/2022 10:35 AM Darlina Rumpf, CNM [facility]    Darlina Rumpf, [place]

## 2021-11-22 NOTE — Progress Notes (Signed)
ROB [redacted]w[redacted]d CC: Vaginal Discharge.

## 2021-11-23 ENCOUNTER — Ambulatory Visit (INDEPENDENT_AMBULATORY_CARE_PROVIDER_SITE_OTHER): Payer: Medicaid Other | Admitting: Licensed Clinical Social Worker

## 2021-11-23 DIAGNOSIS — F4323 Adjustment disorder with mixed anxiety and depressed mood: Secondary | ICD-10-CM

## 2021-11-23 LAB — CERVICOVAGINAL ANCILLARY ONLY
Bacterial Vaginitis (gardnerella): NEGATIVE
Candida Glabrata: NEGATIVE
Candida Vaginitis: NEGATIVE
Comment: NEGATIVE
Comment: NEGATIVE
Comment: NEGATIVE

## 2021-11-23 LAB — CBC
Hematocrit: 33.7 % — ABNORMAL LOW (ref 34.0–46.6)
Hemoglobin: 11.2 g/dL (ref 11.1–15.9)
MCH: 28.4 pg (ref 26.6–33.0)
MCHC: 33.2 g/dL (ref 31.5–35.7)
MCV: 86 fL (ref 79–97)
Platelets: 197 10*3/uL (ref 150–450)
RBC: 3.94 x10E6/uL (ref 3.77–5.28)
RDW: 11.8 % (ref 11.7–15.4)
WBC: 9.8 10*3/uL (ref 3.4–10.8)

## 2021-11-23 LAB — HIV ANTIBODY (ROUTINE TESTING W REFLEX): HIV Screen 4th Generation wRfx: NONREACTIVE

## 2021-11-23 LAB — GLUCOSE TOLERANCE, 2 HOURS W/ 1HR
Glucose, 1 hour: 115 mg/dL (ref 70–179)
Glucose, 2 hour: 104 mg/dL (ref 70–152)
Glucose, Fasting: 75 mg/dL (ref 70–91)

## 2021-11-23 LAB — RPR: RPR Ser Ql: NONREACTIVE

## 2021-11-26 NOTE — BH Specialist Note (Signed)
Integrated Behavioral Health via Telemedicine Visit  11/26/2021 Danielle Evans [id number]  Number of [place] Clinician visits: 1 Session Start time:  9am Session End time: 945am Total time in minutes: 45 mins via mychart video  Referring Provider: Roland Rack  Patient/Family location: Home  [facility] Provider location: [place]  All persons participating in visit: Pt D Danielle Evans and LCSW Danielle Evans  Types of Service: Individual psychotherapy and Video visit  I connected with Danielle Evans and/or Danielle Evans's n/a via  Telephone or Video Enabled Telemedicine Application  (Video is Caregility application) and verified that I am speaking with the correct person using two identifiers. Discussed confidentiality: Yes   I discussed the limitations of telemedicine and the availability of in person appointments.  Discussed there is a possibility of technology failure and discussed alternative modes of communication if that failure occurs.  I discussed that engaging in this telemedicine visit, they consent to the provision of behavioral healthcare and the services will be billed under their insurance.  Patient and/or legal guardian expressed understanding and consented to Telemedicine visit: No   Presenting Concerns: Patient and/or family reports the following symptoms/concerns: stress and anxiety Duration of problem: approx 6 months ; Severity of problem: mild  Patient and/or Family's Strengths/Protective Factors: Concrete supports in place (healthy food, safe environments, etc.)  Goals Addressed: Patient will:  Reduce symptoms of: anxiety and stress   Increase knowledge and/or ability of: coping skills and stress reduction   Demonstrate ability to: Increase healthy adjustment to current life circumstances  Progress towards Goals: Ongoing  Interventions: Interventions utilized:  Supportive Counseling Standardized Assessments completed: PHQ 9  Patient and/or Family  Response:  Danielle Evans responded well to visit. Danielle Evans reports increase stress with extended family members   Assessment: Patient currently experiencing adjustment disorder with mixed mood .   Patient may benefit from integrated behavioral health.  Plan: Follow up with behavioral health clinician on : communicate needs with spouse, journal and mindfulness technique to reduce stress and commnicate concerns with extended family members  Behavioral recommendations: 3 weeks  Referral(s): [place] (In Clinic)  I discussed the assessment and treatment plan with the patient and/or parent/guardian. They were provided an opportunity to ask questions and all were answered. They agreed with the plan and demonstrated an understanding of the instructions.   They were advised to call back or seek an in-person evaluation if the symptoms worsen or if the condition fails to improve as anticipated.  Danielle Ferrier, LCSW

## 2021-12-03 ENCOUNTER — Encounter: Payer: Self-pay | Admitting: Advanced Practice Midwife

## 2021-12-06 ENCOUNTER — Encounter: Payer: Self-pay | Admitting: Advanced Practice Midwife

## 2021-12-06 ENCOUNTER — Telehealth (INDEPENDENT_AMBULATORY_CARE_PROVIDER_SITE_OTHER): Payer: Medicaid Other | Admitting: Advanced Practice Midwife

## 2021-12-06 VITALS — BP 124/59 | HR 86

## 2021-12-06 DIAGNOSIS — F4323 Adjustment disorder with mixed anxiety and depressed mood: Secondary | ICD-10-CM

## 2021-12-06 DIAGNOSIS — Z3A30 30 weeks gestation of pregnancy: Secondary | ICD-10-CM

## 2021-12-06 DIAGNOSIS — Z3480 Encounter for supervision of other normal pregnancy, unspecified trimester: Secondary | ICD-10-CM

## 2021-12-06 NOTE — Progress Notes (Signed)
OBSTETRICS PRENATAL VIRTUAL VISIT ENCOUNTER NOTE  Provider location: Center for [place] at [facility]   Patient location: Home  I connected with Danielle Evans on 12/06/21 at 10:35 AM EDT by MyChart Video Encounter and verified that I am speaking with the correct person using two identifiers. I discussed the limitations, risks, security and privacy concerns of performing an evaluation and management service virtually and the availability of in person appointments. I also discussed with the patient that there may be a patient responsible charge related to this service. The patient expressed understanding and agreed to proceed. Subjective:  Danielle Evans is a 31 y.o. [id number] at 109w4dbeing seen today for ongoing prenatal care.  She is currently monitored for the following issues for this low-risk pregnancy and has Attention and concentration deficit and Supervision of other normal pregnancy, antepartum on their problem list.  Patient reports no complaints.  Contractions: Not present. Vag. Bleeding: None.  Movement: Present. Denies any leaking of fluid.   Danielle Evans has started her new job and her new work team is very supportive, already a great source of positive energy. She is s/p visit with Danielle Ferrierand found it to be helpful with addressing sources of stress in Danielle Evans's life.   The following portions of the patient's history were reviewed and updated as appropriate: allergies, current medications, past family history, past medical history, past social history, past surgical history and problem list.   Objective:   Vitals:   12/06/21 1029  BP: (!) 124/59  Pulse: 86    Fetal Status:     Movement: Present     General:  Alert, oriented and cooperative. Patient is in no acute distress.  Respiratory: Normal respiratory effort, no problems with respiration noted  Mental Status: Normal mood and affect. Normal behavior. Normal judgment and thought content.  Rest of physical  exam deferred due to type of encounter  Imaging: No results found.  Assessment and Plan:  Pregnancy: [id number] [redacted]w[redacted]d. Supervision of other normal pregnancy, antepartum - Doing so well! - Reviewed daily kick counts, interventions for low kick number, indications for evaluation in MAU  2. Adjustment disorder with mixed anxiety and depressed mood - [facility], plan for two week postpartum mood check  3. [redacted] weeks gestation of pregnancy   Preterm labor symptoms and general obstetric precautions including but not limited to vaginal bleeding, contractions, leaking of fluid and fetal movement were reviewed in detail with the patient. I discussed the assessment and treatment plan with the patient. The patient was provided an opportunity to ask questions and all were answered. The patient agreed with the plan and demonstrated an understanding of the instructions. The patient was advised to call back or seek an in-person office evaluation/[facility] concerning [facility]. Please refer to After Visit Summary for other counseling recommendations.   I provided ten minutes of face-to-face time during this encounter.   Future Appointments  Date Time Provider [place]8/24/2023 10:35 AM WeDarlina RumpfCNM [facility]  01/03/2022  9:35 AM WeDarlina RumpfCNM [facility]  01/17/2022  9:35 AM WeDarlina RumpfCNM [facility]  01/24/2022  8:35 AM AnHarolyn RutherfordUgSallyanne HaversMD [facility]  01/31/2022  9:35 AM WeDarlina RumpfCNM [facility]  02/07/2022  8:35 AM AnHarolyn RutherfordUgSallyanne HaversMD [facility]  02/14/2022 10:35 AM WeDarlina RumpfCNM [facility]    SaDarlina RumpfCNM  [facility] for [place]

## 2021-12-06 NOTE — Progress Notes (Signed)
I connected with  Danielle Evans on 12/06/21 at 10:35 AM EDT by telephone and verified that I am speaking with the correct person using two identifiers.   I discussed the limitations, risks, security and privacy concerns of performing an evaluation and management service by telephone and the availability of in person appointments. I also discussed with the patient that there may be a patient responsible charge related to this service. The patient expressed understanding and agreed to proceed.  Crosby Oyster, RN 12/06/2021  10:30 AM

## 2021-12-08 ENCOUNTER — Encounter (HOSPITAL_COMMUNITY): Payer: Self-pay | Admitting: Obstetrics and Gynecology

## 2021-12-08 ENCOUNTER — Inpatient Hospital Stay (HOSPITAL_BASED_OUTPATIENT_CLINIC_OR_DEPARTMENT_OTHER): Payer: Medicaid Other

## 2021-12-08 ENCOUNTER — Inpatient Hospital Stay (HOSPITAL_COMMUNITY)
Admission: AD | Admit: 2021-12-08 | Discharge: 2021-12-08 | Disposition: A | Discharge status: Home / Self Care | Payer: Medicaid Other | Attending: Obstetrics and Gynecology | Admitting: Obstetrics and Gynecology

## 2021-12-08 ENCOUNTER — Other Ambulatory Visit: Payer: Self-pay

## 2021-12-08 DIAGNOSIS — E669 Obesity, unspecified: Secondary | ICD-10-CM | POA: Diagnosis not present

## 2021-12-08 DIAGNOSIS — O09293 Supervision of pregnancy with other poor reproductive or obstetric history, third trimester: Secondary | ICD-10-CM | POA: Diagnosis not present

## 2021-12-08 DIAGNOSIS — O99343 Other mental disorders complicating pregnancy, third trimester: Secondary | ICD-10-CM | POA: Diagnosis not present

## 2021-12-08 DIAGNOSIS — O99213 Obesity complicating pregnancy, third trimester: Secondary | ICD-10-CM | POA: Diagnosis not present

## 2021-12-08 DIAGNOSIS — O99353 Diseases of the nervous system complicating pregnancy, third trimester: Secondary | ICD-10-CM | POA: Insufficient documentation

## 2021-12-08 DIAGNOSIS — O99613 Diseases of the digestive system complicating pregnancy, third trimester: Secondary | ICD-10-CM | POA: Insufficient documentation

## 2021-12-08 DIAGNOSIS — O368130 Decreased fetal movements, third trimester, not applicable or unspecified: Secondary | ICD-10-CM | POA: Insufficient documentation

## 2021-12-08 DIAGNOSIS — Z3689 Encounter for other specified antenatal screening: Secondary | ICD-10-CM | POA: Diagnosis not present

## 2021-12-08 DIAGNOSIS — Z3A30 30 weeks gestation of pregnancy: Secondary | ICD-10-CM

## 2021-12-08 DIAGNOSIS — F419 Anxiety disorder, unspecified: Secondary | ICD-10-CM | POA: Insufficient documentation

## 2021-12-08 NOTE — MAU Note (Signed)
Danielle Evans is a 31 y.o. at 57w6dhere in MAU reporting: decreased FM.  Reports fetus was very active last night but hasn't been as active today.  Reports felt movement while in MAU lobby.  Also states hasn't eaten today, just po fluids.Denies VB or LOF.  Onset of complaint: today Pain score: 4 Vitals:   12/08/21 1112  BP: 127/73  Pulse: 87  Resp: 20  Temp: 97.8 F (36.6 C)  SpO2: 99%     FHT: 145bpm Lab orders placed from triage:   None

## 2021-12-08 NOTE — MAU Provider Note (Signed)
History     CSN: [id number]  Arrival date and time: 12/08/21 1057   Event Date/Time   First Provider Initiated Contact with Patient 12/08/21 1133      Chief Complaint  Patient presents with   Decreased Fetal Movement   31 y.o. [id number] '@30'$ .6 wks presenting with decreased FM. Reports good FM last night but less today. States she slept in late and hasn't eaten yet today. Had some water and Snapple. Had some cramping last night, none today. Denies VB, LOF, ctx.     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   Ovarian cyst    Pap smear abnormality of cervix 2014   Pneumonia    Sleep apnea    childhood, adenoidectomy    Past Surgical History:  Procedure Laterality Date   DILATION AND CURETTAGE OF UTERUS     LAPAROSCOPIC UNILATERAL SALPINGECTOMY Left 11/21/2015   Procedure: LAPAROSCOPIC UNILATERAL SALPINGECTOMY;  Surgeon: Woodroe Mode, MD;  Location: [facility];  Service: Gynecology;  Laterality: Left;   LAPAROSCOPY N/A 11/21/2015   Procedure: LAPAROSCOPY DIAGNOSTIC;  Surgeon: Woodroe Mode, MD;  Location: [facility];  Service: Gynecology;  Laterality: N/A;   NASAL SINUS SURGERY     TONSILECTOMY, ADENOIDECTOMY, BILATERAL MYRINGOTOMY AND TUBES      Family History  Problem Relation Age of Onset   Heart disease Father    Cancer Maternal Grandmother        breast   Parkinsonism Maternal Grandfather    Cancer Paternal Aunt        breast     Social History   Tobacco Use   Smoking status: Former    Years: 3.00    Types: Cigarettes    Quit date: 10/23/2011    Years since  quitting: 10.1   Smokeless tobacco: Never  Vaping Use   Vaping Use: Never used  Substance Use Topics   Alcohol use: No    Alcohol/week: 3.0 standard drinks of alcohol    Types: 3 Standard drinks or equivalent per week    Comment: not with preg   Drug use: No    Comment: hx of marijuana, not with preg    Allergies:  Allergies  Allergen Reactions   Ceclor [Cefaclor] Hives    But has tolerated other cephalosporins, PCN, Augmetin   Morphine And Related Itching   Promethazine Nausea And Vomiting    Medications Prior to Admission  Medication Sig Dispense Refill Last Dose   aspirin EC 81 MG tablet Take 1 tablet (81 mg total) by mouth daily. Take after 12 weeks for prevention of preeclampsia later in pregnancy 300 tablet 2 12/07/2021   calcium carbonate (TUMS - DOSED IN MG ELEMENTAL CALCIUM) 500 MG chewable tablet Chew 1 tablet by mouth daily.   12/08/2021  famotidine (PEPCID) 10 MG tablet Take 1 tablet (10 mg total) by mouth 2 (two) times daily. 60 tablet 0 12/08/2021   gabapentin (NEURONTIN) 100 MG capsule Take 1 capsule (100 mg total) by mouth at bedtime. 30 capsule 10 12/07/2021   Prenatal Vit-Fe Fumarate-FA (PRENATAL MULTIVITAMIN) TABS tablet Take 1 tablet by mouth daily at 12 noon.   12/08/2021   hydrOXYzine (ATARAX) 25 MG tablet Take 1 tablet (25 mg total) by mouth every 6 (six) hours as needed for itching. (Patient not taking: Reported on 12/06/2021) 30 tablet 2    omeprazole (PRILOSEC OTC) 20 MG tablet Take 1 tablet (20 mg total) by mouth daily. (Patient not taking: Reported on 12/06/2021) 30 tablet 0    ondansetron (ZOFRAN) 4 MG tablet Take 1 tablet (4 mg total) by mouth every 8 (eight) hours as needed for nausea or vomiting. (Patient not taking: Reported on 11/22/2021) 20 tablet 1     Review of Systems  Gastrointestinal:  Negative for abdominal pain.  Genitourinary:  Negative for vaginal bleeding and vaginal discharge.   Physical Exam   Blood pressure 127/73, pulse 87, temperature  97.8 F (36.6 C), temperature source Oral, resp. rate 20, height '5\' 7"'$  (1.702 m), weight 99.6 kg, last menstrual period 04/19/2021, SpO2 99 %.  Physical Exam Vitals and nursing note reviewed.  Constitutional:      General: She is not in acute distress.    Appearance: Normal appearance.  HENT:     Head: Normocephalic and atraumatic.  Pulmonary:     Effort: Pulmonary effort is normal. No respiratory distress.  Abdominal:     Palpations: Abdomen is soft.     Tenderness: There is no abdominal tenderness.  Musculoskeletal:        General: Normal range of motion.     Cervical back: Normal range of motion.  Skin:    General: Skin is warm and dry.  Neurological:     General: No focal deficit present.     Mental Status: She is alert and oriented to person, place, and time.  Psychiatric:        Mood and Affect: Mood normal.        Behavior: Behavior normal.   EFM: 135 bpm, mod variability, + accels, no decels Toco: none  No results found for this or any previous visit (from the past 24 hour(s)).  MAU Course  Procedures  MDM NST initially reassuring but not reactive, BPP ordered and (6/8 off for breathing) with normal AFI. NST became reactive once returned from [place] and pt reports excellent FM. Stable for discharge home.   Assessment and Plan   1. [redacted] weeks gestation of pregnancy   2. NST (non-stress test) reactive    Discharge home Follow up at [place] as scheduled FMCs  Allergies as of 12/08/2021       Reactions   Ceclor [cefaclor] Hives   But has tolerated other cephalosporins, PCN, Augmetin   Morphine And Related Itching   Promethazine Nausea And Vomiting        Medication List     STOP taking these medications    hydrOXYzine 25 MG tablet Commonly known as: ATARAX   omeprazole 20 MG tablet Commonly known as: PriLOSEC OTC   ondansetron 4 MG tablet Commonly known as: Zofran       TAKE these medications    aspirin EC 81 MG tablet Take 1 tablet (81 mg  total) by mouth daily. Take after 12 weeks for prevention of preeclampsia later in  pregnancy   calcium carbonate 500 MG chewable tablet Commonly known as: TUMS - dosed in mg elemental calcium Chew 1 tablet by mouth daily.   famotidine 10 MG tablet Commonly known as: PEPCID Take 1 tablet (10 mg total) by mouth 2 (two) times daily.   gabapentin 100 MG capsule Commonly known as: NEURONTIN Take 1 capsule (100 mg total) by mouth at bedtime.   prenatal multivitamin Tabs tablet Take 1 tablet by mouth daily at 12 noon.         Julianne Handler, CNM 12/08/2021, 11:44 AM

## 2021-12-19 ENCOUNTER — Encounter: Payer: Self-pay | Admitting: Advanced Practice Midwife

## 2021-12-20 ENCOUNTER — Inpatient Hospital Stay (HOSPITAL_COMMUNITY): Payer: Medicaid Other

## 2021-12-20 ENCOUNTER — Other Ambulatory Visit: Payer: Self-pay

## 2021-12-20 ENCOUNTER — Inpatient Hospital Stay (HOSPITAL_COMMUNITY)
Admission: AD | Admit: 2021-12-20 | Discharge: 2021-12-20 | Disposition: A | Discharge status: Home / Self Care | Payer: Medicaid Other | Attending: Obstetrics and Gynecology | Admitting: Obstetrics and Gynecology

## 2021-12-20 ENCOUNTER — Telehealth (INDEPENDENT_AMBULATORY_CARE_PROVIDER_SITE_OTHER): Payer: Medicaid Other | Admitting: Advanced Practice Midwife

## 2021-12-20 ENCOUNTER — Encounter: Payer: Self-pay | Admitting: Advanced Practice Midwife

## 2021-12-20 ENCOUNTER — Encounter (HOSPITAL_COMMUNITY): Payer: Self-pay | Admitting: Obstetrics and Gynecology

## 2021-12-20 DIAGNOSIS — O2313 Infections of bladder in pregnancy, third trimester: Secondary | ICD-10-CM | POA: Insufficient documentation

## 2021-12-20 DIAGNOSIS — R109 Unspecified abdominal pain: Secondary | ICD-10-CM

## 2021-12-20 DIAGNOSIS — R1030 Lower abdominal pain, unspecified: Secondary | ICD-10-CM | POA: Diagnosis not present

## 2021-12-20 DIAGNOSIS — Z3480 Encounter for supervision of other normal pregnancy, unspecified trimester: Secondary | ICD-10-CM

## 2021-12-20 DIAGNOSIS — O212 Late vomiting of pregnancy: Secondary | ICD-10-CM | POA: Diagnosis not present

## 2021-12-20 DIAGNOSIS — O2343 Unspecified infection of urinary tract in pregnancy, third trimester: Secondary | ICD-10-CM

## 2021-12-20 DIAGNOSIS — Z3A32 32 weeks gestation of pregnancy: Secondary | ICD-10-CM

## 2021-12-20 DIAGNOSIS — O26893 Other specified pregnancy related conditions, third trimester: Secondary | ICD-10-CM | POA: Insufficient documentation

## 2021-12-20 DIAGNOSIS — N136 Pyonephrosis: Secondary | ICD-10-CM | POA: Insufficient documentation

## 2021-12-20 DIAGNOSIS — O26899 Other specified pregnancy related conditions, unspecified trimester: Secondary | ICD-10-CM

## 2021-12-20 DIAGNOSIS — N3000 Acute cystitis without hematuria: Secondary | ICD-10-CM | POA: Diagnosis not present

## 2021-12-20 DIAGNOSIS — O09293 Supervision of pregnancy with other poor reproductive or obstetric history, third trimester: Secondary | ICD-10-CM | POA: Diagnosis not present

## 2021-12-20 DIAGNOSIS — Z3689 Encounter for other specified antenatal screening: Secondary | ICD-10-CM | POA: Diagnosis not present

## 2021-12-20 DIAGNOSIS — O219 Vomiting of pregnancy, unspecified: Secondary | ICD-10-CM

## 2021-12-20 LAB — CBC WITH DIFFERENTIAL/PLATELET
Abs Immature Granulocytes: 0.06 10*3/uL (ref 0.00–0.07)
Basophils Absolute: 0 10*3/uL (ref 0.0–0.1)
Basophils Relative: 0 %
Eosinophils Absolute: 0.1 10*3/uL (ref 0.0–0.5)
Eosinophils Relative: 1 %
HCT: 31.5 % — ABNORMAL LOW (ref 36.0–46.0)
Hemoglobin: 10.3 g/dL — ABNORMAL LOW (ref 12.0–15.0)
Immature Granulocytes: 1 %
Lymphocytes Relative: 17 %
Lymphs Abs: 2 10*3/uL (ref 0.7–4.0)
MCH: 27.3 pg (ref 26.0–34.0)
MCHC: 32.7 g/dL (ref 30.0–36.0)
MCV: 83.6 fL (ref 80.0–100.0)
Monocytes Absolute: 0.7 10*3/uL (ref 0.1–1.0)
Monocytes Relative: 6 %
Neutro Abs: 8.9 10*3/uL — ABNORMAL HIGH (ref 1.7–7.7)
Neutrophils Relative %: 75 %
Platelets: 156 10*3/uL (ref 150–400)
RBC: 3.77 MIL/uL — ABNORMAL LOW (ref 3.87–5.11)
RDW: 12.1 % (ref 11.5–15.5)
WBC: 11.8 10*3/uL — ABNORMAL HIGH (ref 4.0–10.5)
nRBC: 0 % (ref 0.0–0.2)

## 2021-12-20 LAB — URINALYSIS, ROUTINE W REFLEX MICROSCOPIC
Bilirubin Urine: NEGATIVE
Glucose, UA: NEGATIVE mg/dL
Hgb urine dipstick: NEGATIVE
Ketones, ur: NEGATIVE mg/dL
Nitrite: NEGATIVE
Protein, ur: 30 mg/dL — AB
Specific Gravity, Urine: 1.024 (ref 1.005–1.030)
pH: 7 (ref 5.0–8.0)

## 2021-12-20 LAB — COMPREHENSIVE METABOLIC PANEL
ALT: 11 U/L (ref 0–44)
AST: 16 U/L (ref 15–41)
Albumin: 2.6 g/dL — ABNORMAL LOW (ref 3.5–5.0)
Alkaline Phosphatase: 91 U/L (ref 38–126)
Anion gap: 7 (ref 5–15)
BUN: 5 mg/dL — ABNORMAL LOW (ref 6–20)
CO2: 22 mmol/L (ref 22–32)
Calcium: 9.3 mg/dL (ref 8.9–10.3)
Chloride: 109 mmol/L (ref 98–111)
Creatinine, Ser: 0.7 mg/dL (ref 0.44–1.00)
GFR, Estimated: >60 mL/min (ref >60)
Glucose, Bld: 80 mg/dL (ref 70–99)
Potassium: 3.9 mmol/L (ref 3.5–5.1)
Sodium: 138 mmol/L (ref 135–145)
Total Bilirubin: 0.5 mg/dL (ref 0.3–1.2)
Total Protein: 5.6 g/dL — ABNORMAL LOW (ref 6.5–8.1)

## 2021-12-20 MED ORDER — ONDANSETRON 4 MG PO TBDP: 4.0000 mg | ORAL_TABLET | Freq: Four times a day (QID) | ORAL | 0 refills | Status: DC | PRN

## 2021-12-20 MED ORDER — LACTATED RINGERS IV SOLN
INTRAVENOUS | Status: DC
Start: 2021-12-20 — End: 2021-12-21

## 2021-12-20 MED ORDER — SODIUM CHLORIDE 0.9 % IV SOLN
INTRAVENOUS | Status: DC
Start: 2021-12-20 — End: 2021-12-21

## 2021-12-20 MED ORDER — OXYCODONE-ACETAMINOPHEN 5-325 MG PO TABS
2.0000 | ORAL_TABLET | Freq: Four times a day (QID) | ORAL | Status: DC | PRN
  Administered 2021-12-20: 2 via ORAL
  Filled 2021-12-20: qty 2

## 2021-12-20 MED ORDER — SODIUM CHLORIDE 0.9 % IV SOLN
2.0000 g | Freq: Once | INTRAVENOUS | Status: AC
  Administered 2021-12-20: 2 g via INTRAVENOUS
  Filled 2021-12-20: qty 20

## 2021-12-20 MED ORDER — FAMOTIDINE IN NACL 20-0.9 MG/50ML-% IV SOLN
20.0000 mg | Freq: Once | INTRAVENOUS | Status: AC
  Administered 2021-12-20: 20 mg via INTRAVENOUS
  Filled 2021-12-20: qty 50

## 2021-12-20 MED ORDER — ONDANSETRON HCL 4 MG/2ML IJ SOLN
4.0000 mg | Freq: Once | INTRAMUSCULAR | Status: AC
  Administered 2021-12-20: 4 mg via INTRAVENOUS
  Filled 2021-12-20: qty 2

## 2021-12-20 MED ORDER — OXYCODONE HCL 5 MG PO TABS: 5.0000 mg | ORAL_TABLET | ORAL | 0 refills | Status: AC | PRN

## 2021-12-20 MED ORDER — HYDROMORPHONE HCL 1 MG/ML IJ SOLN
1.0000 mg | Freq: Once | INTRAMUSCULAR | Status: AC
  Administered 2021-12-20: 1 mg via INTRAVENOUS
  Filled 2021-12-20: qty 1

## 2021-12-20 MED ORDER — TAMSULOSIN HCL 0.4 MG PO CAPS: 0.4000 mg | ORAL_CAPSULE | Freq: Every day | ORAL | 2 refills | Status: DC

## 2021-12-20 MED ORDER — LACTATED RINGERS IV BOLUS
1000.0000 mL | Freq: Once | INTRAVENOUS | Status: AC
  Administered 2021-12-20: 1000 mL via INTRAVENOUS

## 2021-12-20 NOTE — Progress Notes (Signed)
Virtual ROB [redacted]w[redacted]d Recently had MAU visit. UTI sx's are improving. Currently on antibiotics.  Notes cramps that come and go.

## 2021-12-20 NOTE — MAU Note (Signed)
Danielle Evans is a 31 y.o. at 13w4dhere in MAU reporting: she's currently taking antibiotics for UTI since Sunday night.  Reports pain is worsening and is located in right flank area. States pain is constant and sharp. Reports has a Hx of kidney stones in the past and pain is similar.   Denies VB or LOF.  Endorses +FM. LMP: N/A Onset of complaint: 2 days ago Pain score: 8 Vitals:   12/20/21 1831  BP: 109/65  Pulse: 89  Resp: 18  Temp: 98.1 F (36.7 C)  SpO2: 97%     FHT:139 bpm Lab orders placed from triage:   UA

## 2021-12-20 NOTE — Progress Notes (Signed)
OBSTETRICS PRENATAL VIRTUAL VISIT ENCOUNTER NOTE  Provider location: [facility] for [place] at [facility]   Patient location: Home  I connected with Danielle Evans on 12/20/21 at 10:35 AM EDT by MyChart Video Encounter and verified that I am speaking with the correct person using two identifiers. I discussed the limitations, risks, security and privacy concerns of performing an evaluation and management service virtually and the availability of in person appointments. I also discussed with the patient that there may be a patient responsible charge related to this service. The patient expressed understanding and agreed to proceed. Subjective:  Danielle Evans is a 31 y.o. A5W0981 at 25w4dbeing seen today for ongoing prenatal care.  She is currently monitored for the following issues for this low-risk pregnancy and has Attention and concentration deficit and Supervision of other normal pregnancy, antepartum on their problem list.  Patient reports  ongoing abdominal pain and vomiting [facility] Symptoms are improving and she was able to tolerate PO intake today. Patient thinks she may have a kidney stone. She denies fever but endorses sharp pain in her flank and abdomen.  Contractions: Not present. Vag. Bleeding: None.  Movement: Present. Denies any leaking of fluid.   The following portions of the patient's history were reviewed and updated as appropriate: allergies, current medications, past family history, past medical history, past social history, past surgical history and problem list.   Objective:  There were no vitals filed for this visit.  Fetal Status:     Movement: Present     General:  Alert, oriented and cooperative. Patient is in no acute distress.  Respiratory: Normal respiratory effort, no problems with respiration noted  Mental Status: Normal mood and affect. Normal behavior. Normal judgment and thought content.  Rest of  physical exam deferred due to type of encounter  Imaging: [place] FETAL BPP WO NON STRESS  Result Date: 12/09/2021 ----------------------------------------------------------------------  OBSTETRICS REPORT                        (Signed Final 12/09/2021 08:31 am) ---------------------------------------------------------------------- Patient Info  ID #:       [id number]                         D.O.B.:  112-Oct-1992(30 yrs)  Name:       [name]                 Visit Date: 12/08/2021 12:57 pm ---------------------------------------------------------------------- Performed By  Attending:        CSander Nephew     Ref. Address:      [address] Performed By:     KJeanene ErbBS,      [facility]  Referred By:      [place] ---------------------------------------------------------------------- Orders  #  Description                           Code        Ordered By  1  [place] MFM FETAL BPP WO NON               25053.97    MELANIE BHAMBRI     STRESS ----------------------------------------------------------------------  #  Order #                     Accession #                Episode #  1  [id number]                   [id number]                 [id number] ---------------------------------------------------------------------- Indications  Decreased fetal movement                        H96.2229  Obesity complicating pregnancy, third           O99.213  trimester (BMI 33)  [redacted] weeks gestation of pregnancy                 Z3A.30  LR NIPS/Neg [place] ---------------------------------------------------------------------- Fetal Evaluation  Num Of Fetuses:          1  Fetal Heart Rate(bpm):   149  Cardiac Activity:        Observed  Presentation:            Cephalic  Placenta:                Posterior  P. Cord Insertion:        Previously Visualized  Amniotic Fluid  AFI FV:      Within normal limits  AFI Sum(cm)     %Tile       Largest Pocket(cm)  10.28           17          3.98  RUQ(cm)                     LUQ(cm)        LLQ(cm)  3.98                        3.69           2.61 ---------------------------------------------------------------------- Biophysical Evaluation  Amniotic F.V:   Pocket => 2 cm             F. Tone:         Observed  F. Movement:    Observed                   Score:           6/8  F. Breathing:   Not Observed ---------------------------------------------------------------------- OB History  Gravidity:    6         Term:   1         SAB:   3  Ectopic:      1         Living: 1 ---------------------------------------------------------------------- Gestational Age  LMP:           33w 2d  Date:  04/19/21                 EDD:   01/24/22  Best:          Weston Settle 6d     Det. By:  Loman Chroman         EDD:   02/10/22                                      (06/25/21) ---------------------------------------------------------------------- Anatomy  Diaphragm:             Appears normal         Bladder:                Appears normal  Stomach:               Appears normal, left                         sided ---------------------------------------------------------------------- Cervix Uterus Adnexa  Cervix  Not visualized (advanced GA >24wks) ---------------------------------------------------------------------- Impression  Antenatal testing performed given decreased fetal movement.  The biophysical profile was 8/8 with good fetal movement and  amniotic fluid volume. ---------------------------------------------------------------------- Recommendations  Management per inpatient provider ----------------------------------------------------------------------              Sander Nephew, MD Electronically Signed Final Report   12/09/2021 08:31 am ----------------------------------------------------------------------   Assessment  and Plan:  Pregnancy: [id number] at 48w4d1. Supervision of other normal pregnancy, antepartum - LOB  2. Urinary tract infection in mother during third trimester of pregnancy   3. [redacted] weeks gestation of pregnancy   4. Flank pain in pregnant patient - No hematuria on dipstick from 08/20, no fever or dysuria at this time - Encouraged to present to MAU for evaluation of possible kidney stone - Report given to Dr. EDione Ploverand VJordan Hawks CAliso ViejoRN  Preterm labor symptoms and general obstetric precautions including but not limited to vaginal bleeding, contractions, leaking of fluid and fetal movement were reviewed in detail with the patient. I discussed the assessment and treatment plan with the patient. The patient was provided an opportunity to ask questions and all were answered. The patient agreed with the plan and demonstrated an understanding of the instructions. The patient was advised to call back or seek an in-person office evaluation/[facility]. Please refer to After Visit Summary for other counseling recommendations.   I provided ten minutes of face-to-face time during this encounter.   Future Appointments  Date Time Provider [place] 01/03/2022  9:35 AM WDarlina Rumpf CNM [facility]  01/17/2022  9:35 AM WDarlina Rumpf CNM [facility]  01/24/2022  8:35 AM AHarolyn Rutherford USallyanne Havers MD [facility]  01/31/2022  9:35 AM WDarlina Rumpf CNM [facility]  02/07/2022  8:35 AM AHarolyn Rutherford USallyanne Havers MD [facility]  02/14/2022 10:35 AM WDarlina Rumpf CNM [facility]    SDarlina Rumpf CMissionfor WDean Foods Company CEast [place]

## 2021-12-20 NOTE — MAU Provider Note (Cosign Needed Addendum)
Chief Complaint:  Flank Pain   Event Date/Time   First Provider Initiated Contact with Patient 12/20/21 1944     HPI: Danielle Evans is a 31 y.o. [id number] at 110w4dwho presents to maternity admissions reporting worsening right flank pain reminiscent of past kidney stones, back spasms causing nausea/vomiting x3 days and has not eaten anything all day and continued dysuria. Also reports gastric reflux that is worse today as she is out of pepcid and has not picked up her new omeprazole prescription due to financial constraints. Denies vaginal bleeding, leaking of fluid, decreased fetal movement, fever, falls, or recent illness.   P[facility] Course: [facility]  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   Ovarian cyst    Pap smear abnormality of cervix 2014   Pneumonia    Sleep apnea    childhood, adenoidectomy   OB History  Gravida Para Term Preterm AB Living  '6 1 1   4 1  '$ SAB IAB Ectopic Multiple Live Births  3   1        # Outcome Date GA Lbr Len/2nd Weight Sex Delivery Anes PTL Lv  6 Current           5 SAB 03/2020          4 Term 03/30/17 365w6d M Vag-Spont     3 SAB 02/2015 1153w0d      2 Ectopic 2015 [id number]     1 SAB 2011 [id number]     Past Surgical History:  Procedure Laterality Date   DILATION AND CURETTAGE OF UTERUS     LAPAROSCOPIC UNILATERAL SALPINGECTOMY Left 11/21/2015   Procedure: LAPAROSCOPIC UNILATERAL SALPINGECTOMY;  Surgeon: JamesWoodroe Mode  [facility]  Service: Gynecology;  Laterality: Left;   LAPAROSCOPY N/A 11/21/2015   Procedure: LAPAROSCOPY DIAGNOSTIC;  Surgeon: JamesWoodroe Mode  [facility]  Service: Gynecology;  Laterality: N/A;   NASAL SINUS SURGERY      TONSILECTOMY, ADENOIDECTOMY, BILATERAL MYRINGOTOMY AND TUBES     Family History  Problem Relation Age of Onset   Heart disease Father    Cancer Maternal Grandmother        breast   Parkinsonism Maternal Grandfather    Cancer Paternal Aunt        breast    Social History   Tobacco Use   Smoking status: Former    Years: 3.00    Types: Cigarettes    Quit date: 10/23/2011    Years since quitting: 10.1   Smokeless tobacco: Never  Vaping Use   Vaping Use: Never used  Substance Use Topics   Alcohol use: No    Alcohol/week: 3.0 standard drinks of alcohol    Types: 3 Standard drinks or equivalent per week    Comment: not with preg   Drug use: No    Comment: hx of marijuana, not with preg   Allergies  Allergen Reactions   Ceclor [Cefaclor] Hives    But has tolerated other cephalosporins, PCN, Augmetin   Morphine And Related Itching   Promethazine Nausea And Vomiting   Medications Prior to  Admission  Medication Sig Dispense Refill Last Dose   aspirin EC 81 MG tablet Take 1 tablet (81 mg total) by mouth daily. Take after 12 weeks for prevention of preeclampsia later in pregnancy 300 tablet 2 12/19/2021   calcium carbonate (TUMS - DOSED IN MG ELEMENTAL CALCIUM) 500 MG chewable tablet Chew 1 tablet by mouth daily.   12/20/2021   famotidine (PEPCID) 10 MG tablet Take 1 tablet (10 mg total) by mouth 2 (two) times daily. 60 tablet 0 12/19/2021   gabapentin (NEURONTIN) 100 MG capsule Take 1 capsule (100 mg total) by mouth at bedtime. 30 capsule 10 12/19/2021   nitrofurantoin, macrocrystal-monohydrate, (MACROBID) 100 MG capsule Take 100 mg by mouth 2 (two) times daily.   12/20/2021   Prenatal Vit-Fe Fumarate-FA (PRENATAL MULTIVITAMIN) TABS tablet Take 1 tablet by mouth daily at 12 noon.   12/19/2021   ondansetron (ZOFRAN-ODT) 4 MG disintegrating tablet Take 1 tablet (4 mg total) by mouth every 6 (six) hours as needed for nausea. 20 tablet 0    I have reviewed patient's Past Medical Hx, Surgical  Hx, Family Hx, Social Hx, medications and allergies.   ROS:  Pertinent items noted in HPI and remainder of comprehensive ROS otherwise negative.   Physical Exam  Patient Vitals for the past 24 hrs:  BP Temp Temp src Pulse Resp SpO2 Height Weight  12/20/21 1849 114/69 -- -- 92 -- -- -- --  12/20/21 1831 109/65 98.1 F (36.7 C) Oral 89 18 97 % -- --  12/20/21 1825 -- -- -- -- -- -- '5\' 7"'$  (1.702 m) 222 lb 1.6 oz (100.7 kg)   Constitutional: Well-developed, well-nourished female in no acute distress.  Cardiovascular: normal rate & rhythm, warm and well-perfused Respiratory: normal effort, no problems with respiration noted GI: Abd soft, non-tender, gravid appropriate for gestational age MS: Extremities nontender, no edema, normal ROM Neurologic: Alert and oriented x 4.  GU: right CVA tenderness Pelvic: exam deferred  Fetal Tracing: reactive Baseline: 130 Variability: minimal Accelerations: 10x10, appropriate for gestational age 69: none Toco: relaxed with occasional UI   Labs: Results for orders placed or performed during the hospital encounter of 12/20/21 (from the past 24 hour(s))  Urinalysis, Routine w reflex microscopic Urine, Clean Catch     Status: Abnormal   Collection Time: 12/20/21  6:34 PM  Result Value Ref Range   Color, Urine AMBER (A) YELLOW   APPearance HAZY (A) CLEAR   Specific Gravity, Urine 1.024 1.005 - 1.030   pH 7.0 5.0 - 8.0   Glucose, UA NEGATIVE NEGATIVE mg/dL   Hgb urine dipstick NEGATIVE NEGATIVE   Bilirubin Urine NEGATIVE NEGATIVE   Ketones, ur NEGATIVE NEGATIVE mg/dL   Protein, ur 30 (A) NEGATIVE mg/dL   Nitrite NEGATIVE NEGATIVE   Leukocytes,Ua MODERATE (A) NEGATIVE   RBC / HPF 0-5 0 - 5 RBC/hpf   WBC, UA 21-50 0 - 5 WBC/hpf   Bacteria, UA FEW (A) NONE SEEN   Squamous Epithelial / LPF 11-20 0 - 5   Mucus PRESENT   CBC with Differential/Platelet     Status: Abnormal   Collection Time: 12/20/21  8:05 PM  Result Value Ref Range    WBC 11.8 (H) 4.0 - 10.5 K/uL   RBC 3.77 (L) 3.87 - 5.11 MIL/uL   Hemoglobin 10.3 (L) 12.0 - 15.0 g/dL   HCT 31.5 (L) 36.0 - 46.0 %   MCV 83.6 80.0 - 100.0 fL   MCH 27.3 26.0 - 34.0 pg   MCHC  32.7 30.0 - 36.0 g/dL   RDW 12.1 11.5 - 15.5 %   Platelets 156 150 - 400 K/uL   nRBC 0.0 0.0 - 0.2 %   Neutrophils Relative % 75 %   Neutro Abs 8.9 (H) 1.7 - 7.7 K/uL   Lymphocytes Relative 17 %   Lymphs Abs 2.0 0.7 - 4.0 K/uL   Monocytes Relative 6 %   Monocytes Absolute 0.7 0.1 - 1.0 K/uL   Eosinophils Relative 1 %   Eosinophils Absolute 0.1 0.0 - 0.5 K/uL   Basophils Relative 0 %   Basophils Absolute 0.0 0.0 - 0.1 K/uL   Immature Granulocytes 1 %   Abs Immature Granulocytes 0.06 0.00 - 0.07 K/uL  Comprehensive metabolic panel     Status: Abnormal   Collection Time: 12/20/21  8:05 PM  Result Value Ref Range   Sodium 138 135 - 145 mmol/L   Potassium 3.9 3.5 - 5.1 mmol/L   Chloride 109 98 - 111 mmol/L   CO2 22 22 - 32 mmol/L   Glucose, Bld 80 70 - 99 mg/dL   BUN 5 (L) 6 - 20 mg/dL   Creatinine, Ser 0.70 0.44 - 1.00 mg/dL   Calcium 9.3 8.9 - 10.3 mg/dL   Total Protein 5.6 (L) 6.5 - 8.1 g/dL   Albumin 2.6 (L) 3.5 - 5.0 g/dL   AST 16 15 - 41 U/L   ALT 11 0 - 44 U/L   Alkaline Phosphatase 91 38 - 126 U/L   Total Bilirubin 0.5 0.3 - 1.2 mg/dL   GFR, Estimated >60 >60 mL/min   Anion gap 7 5 - 15   Imaging:  US RENAL  Result Date: 12/20/2021 CLINICAL DATA:  Right flank pain, UTI.  Thirty-two weeks ago EXAM: RENAL / URINARY TRACT ULTRASOUND COMPLETE COMPARISON:  08/18/2021 FINDINGS: Right Kidney: Renal measurements: 10.1 x 5.1 x 4.6 cm = volume: 123 mL. Mild right hydronephrosis, likely related to advanced gestational age. No mass. Normal echotexture. Left Kidney: Renal measurements: 10.0 x 5.5 x 6.1 cm = volume: 179 mL. Echogenicity within normal limits. No mass or hydronephrosis visualized. Bladder: Appears normal for degree of bladder distention. Other: None. IMPRESSION: Mild right  hydronephrosis, likely related to advanced gestational age. Electronically Signed   By: Rolm Baptise M.D.   On: 12/20/2021 21:09    MAU Course: Orders Placed This Encounter  Procedures   Culture, OB Urine   US RENAL   Urinalysis, Routine w reflex microscopic Urine, Clean Catch   CBC with Differential/Platelet   Comprehensive metabolic panel   Meds ordered this encounter  Medications   lactated ringers bolus 1,000 mL   ondansetron (ZOFRAN) injection 4 mg   HYDROmorphone (DILAUDID) injection 1 mg   famotidine (PEPCID) IVPB 20 mg premix   lactated ringers infusion   cefTRIAXone (ROCEPHIN) 2 g in sodium chloride 0.9 % 100 mL IVPB    Order Specific Question:   Antibiotic Indication:    Answer:   UTI   0.9 %  sodium chloride infusion   oxyCODONE (ROXICODONE) 5 MG immediate release tablet    Sig: Take 1 tablet (5 mg total) by mouth every 4 (four) hours as needed for up to 3 days for severe pain.    Dispense:  18 tablet    Refill:  0    Order Specific Question:   Supervising Provider    Answer:   Donnamae Jude [[id number]]   tamsulosin (FLOMAX) 0.4 MG CAPS capsule    Sig: Take 1  capsule (0.4 mg total) by mouth daily.    Dispense:  30 capsule    Refill:  2    Order Specific Question:   Supervising Provider    Answer:   Donnamae Jude [[id number]]   MDM: LR bolus with zofran, pepcid and dilaudid with good relief of pain and nausea.  Discussed results with Dr. Ilda Basset who recommended a dose of rocephin (pt has tolerated well before despite allergy to cefclor) and PO challenge. If PO challenge is successful, pt can discharge on flomax (in case of small stone) and oxycodone. Has zofran and clotrimazole prescribed (for nausea and prophylactically for yeast infection). Rocephin started and pt trying crackers and ginger ale.  Care turned over to Hansel Feinstein, CNM. Gaylan Gerold, CNM, MSN, IBCLC  Did well after IV and meds Feels better Discussed unclear source of pain. Agrees to try flomax and  hydration.   Assessment: 1. Right flank pain   2. Lower abdominal pain   3. Nausea/vomiting in pregnancy   4. Acute cystitis without hematuria   5. NST (non-stress test) reactive   6. [redacted] weeks gestation of pregnancy    Plan: Discharge home PO hydration Rx Flomax prn pain Rx limited number Oxy IR for pain Followup in office Encouraged to return if she develops worsening of symptoms, increase in pain, fever, or other concerning symptoms.      Allergies as of 12/20/2021       Reactions   Ceclor [cefaclor] Hives   But has tolerated other cephalosporins, PCN, Augmetin   Morphine And Related Itching   Promethazine Nausea And Vomiting        Medication List     TAKE these medications    aspirin EC 81 MG tablet Take 1 tablet (81 mg total) by mouth daily. Take after 12 weeks for prevention of preeclampsia later in pregnancy   calcium carbonate 500 MG chewable tablet Commonly known as: TUMS - dosed in mg elemental calcium Chew 1 tablet by mouth daily.   famotidine 10 MG tablet Commonly known as: PEPCID Take 1 tablet (10 mg total) by mouth 2 (two) times daily.   gabapentin 100 MG capsule Commonly known as: NEURONTIN Take 1 capsule (100 mg total) by mouth at bedtime.   nitrofurantoin (macrocrystal-monohydrate) 100 MG capsule Commonly known as: MACROBID Take 100 mg by mouth 2 (two) times daily.   ondansetron 4 MG disintegrating tablet Commonly known as: ZOFRAN-ODT Take 1 tablet (4 mg total) by mouth every 6 (six) hours as needed for nausea.   oxyCODONE 5 MG immediate release tablet Commonly known as: Roxicodone Take 1 tablet (5 mg total) by mouth every 4 (four) hours as needed for up to 3 days for severe pain.   prenatal multivitamin Tabs tablet Take 1 tablet by mouth daily at 12 noon.   tamsulosin 0.4 MG Caps capsule Commonly known as: FLOMAX Take 1 capsule (0.4 mg total) by mouth daily.       Gaylan Gerold, CNM, MSN, [place] Certified Nurse Midwife, [facility]   [place], [place]

## 2021-12-21 LAB — CULTURE, OB URINE: Culture: NO GROWTH

## 2021-12-23 ENCOUNTER — Encounter (HOSPITAL_COMMUNITY): Payer: Self-pay | Admitting: Obstetrics & Gynecology

## 2021-12-23 ENCOUNTER — Inpatient Hospital Stay (HOSPITAL_COMMUNITY)
Admission: AD | Admit: 2021-12-23 | Discharge: 2021-12-23 | Disposition: A | Discharge status: Home / Self Care | Payer: Medicaid Other | Attending: Obstetrics & Gynecology | Admitting: Obstetrics & Gynecology

## 2021-12-23 DIAGNOSIS — O99891 Other specified diseases and conditions complicating pregnancy: Secondary | ICD-10-CM | POA: Insufficient documentation

## 2021-12-23 DIAGNOSIS — Z885 Allergy status to narcotic agent status: Secondary | ICD-10-CM | POA: Insufficient documentation

## 2021-12-23 DIAGNOSIS — Z3A33 33 weeks gestation of pregnancy: Secondary | ICD-10-CM | POA: Insufficient documentation

## 2021-12-23 DIAGNOSIS — N1330 Unspecified hydronephrosis: Secondary | ICD-10-CM | POA: Insufficient documentation

## 2021-12-23 DIAGNOSIS — Z881 Allergy status to other antibiotic agents status: Secondary | ICD-10-CM | POA: Insufficient documentation

## 2021-12-23 DIAGNOSIS — O26893 Other specified pregnancy related conditions, third trimester: Secondary | ICD-10-CM | POA: Diagnosis present

## 2021-12-23 DIAGNOSIS — N888 Other specified noninflammatory disorders of cervix uteri: Secondary | ICD-10-CM | POA: Diagnosis not present

## 2021-12-23 DIAGNOSIS — Z7982 Long term (current) use of aspirin: Secondary | ICD-10-CM | POA: Diagnosis not present

## 2021-12-23 DIAGNOSIS — Z3689 Encounter for other specified antenatal screening: Secondary | ICD-10-CM

## 2021-12-23 DIAGNOSIS — R109 Unspecified abdominal pain: Secondary | ICD-10-CM | POA: Diagnosis present

## 2021-12-23 LAB — URINALYSIS, ROUTINE W REFLEX MICROSCOPIC
Bilirubin Urine: NEGATIVE
Glucose, UA: 50 mg/dL — AB
Ketones, ur: NEGATIVE mg/dL
Nitrite: NEGATIVE
Protein, ur: 30 mg/dL — AB
RBC / HPF: >50 RBC/hpf — ABNORMAL HIGH (ref 0–5)
Specific Gravity, Urine: 1.024 (ref 1.005–1.030)
WBC, UA: >50 WBC/hpf — ABNORMAL HIGH (ref 0–5)
pH: 5 (ref 5.0–8.0)

## 2021-12-23 LAB — WET PREP, GENITAL
Clue Cells Wet Prep HPF POC: NONE SEEN
Sperm: NONE SEEN
Trich, Wet Prep: NONE SEEN
WBC, Wet Prep HPF POC: >=10 — AB (ref <10)
Yeast Wet Prep HPF POC: NONE SEEN

## 2021-12-23 MED ORDER — HYDROMORPHONE HCL 1 MG/ML IJ SOLN
1.0000 mg | Freq: Once | INTRAMUSCULAR | Status: AC
  Administered 2021-12-23: 1 mg via INTRAMUSCULAR
  Filled 2021-12-23: qty 1

## 2021-12-23 NOTE — MAU Provider Note (Signed)
History     CSN: [id number]  Arrival date and time: 12/23/21 8756   Event Date/Time   First Provider Initiated Contact with Patient 12/23/21 1945      Chief Complaint  Patient presents with   Flank Pain   [name] is a 31 y.o. [id number] at 37w0dwho presents to MAU for multiple concerns.  Patient was seen 12/20/2021 for flank pain and diagnosed with acute cystitis and treated with 2g Rocephin in MAU and discharged without additional antibiotics and with oxycodone and Flomax for possible renal stone, though no stone was identified on [place]  Patient returns today with two main concerns:  1. Smooth, white discharge with pink tinge  2. Return of 8/10 right flank/low back pain and burning sensation over low, center pelvis. Patient states took oxycodone for pain about 5 hours prior to coming to MAU. Patient also believes she passed a kidney stone earlier today and shows provider a photo of a blurred, yellow, half circle on her photo that she says came out on the toilet paper while wiping. Patient states she feels a stone moving around in her bladder and that it is preventing her from completely emptying her bladder.  Pt denies VB, LOF, ctx, decreased FM, vaginal discharge/odor/itching. Pt denies N/V, abdominal pain, constipation, diarrhea, or urinary problems. Pt denies fever, chills, fatigue, sweating or changes in appetite. Pt denies SOB or chest pain. Pt denies dizziness, HA, light-headedness, weakness.  Allergies? 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   Ovarian cyst    Pap smear abnormality of cervix 2014   Pneumonia    Sleep apnea    childhood, adenoidectomy    Past Surgical History:  Procedure Laterality Date   DILATION AND CURETTAGE OF UTERUS     LAPAROSCOPIC UNILATERAL SALPINGECTOMY Left 11/21/2015   Procedure: LAPAROSCOPIC UNILATERAL SALPINGECTOMY;  Surgeon: JWoodroe Mode MD;  [facility];  Service: Gynecology;  Laterality: Left;   LAPAROSCOPY N/A 11/21/2015   Procedure: LAPAROSCOPY DIAGNOSTIC;  Surgeon: JWoodroe Mode MD;  [facility];  Service: Gynecology;  Laterality: N/A;   NASAL SINUS SURGERY     TONSILECTOMY, ADENOIDECTOMY, BILATERAL MYRINGOTOMY AND TUBES      Family History  Problem Relation Age of Onset   Heart disease Father    Cancer Maternal Grandmother        breast   Parkinsonism Maternal Grandfather    Cancer Paternal Aunt        breast     Social History   Tobacco Use   Smoking status: Former    Years: 3.00    Types: Cigarettes    Quit date: 10/23/2011    Years since quitting: 10.1   Smokeless tobacco: Never  Vaping  Use   Vaping Use: Never used  Substance Use Topics   Alcohol use: No    Alcohol/week: 3.0 standard drinks of alcohol    Types: 3 Standard drinks or equivalent per week    Comment: not with preg   Drug use: No    Comment: hx of marijuana, not with preg    Allergies:  Allergies  Allergen Reactions   Ceclor [Cefaclor] Hives    But has tolerated other cephalosporins, PCN, Augmetin   Morphine And Related Itching   Promethazine Nausea And Vomiting    Medications Prior to Admission  Medication Sig Dispense Refill Last Dose   aspirin EC 81 MG tablet Take 1 tablet (81 mg total) by mouth daily. Take after 12 weeks for prevention of preeclampsia later in pregnancy 300 tablet 2 12/22/2021   ondansetron (ZOFRAN-ODT) 4 MG disintegrating tablet Take 1 tablet (4 mg total) by mouth every 6 (six) hours as needed for nausea. 20 tablet 0 12/23/2021    oxyCODONE (ROXICODONE) 5 MG immediate release tablet Take 1 tablet (5 mg total) by mouth every 4 (four) hours as needed for up to 3 days for severe pain. 18 tablet 0 12/23/2021   tamsulosin (FLOMAX) 0.4 MG CAPS capsule Take 1 capsule (0.4 mg total) by mouth daily. 30 capsule 2 12/23/2021   calcium carbonate (TUMS - DOSED IN MG ELEMENTAL CALCIUM) 500 MG chewable tablet Chew 1 tablet by mouth daily.      famotidine (PEPCID) 10 MG tablet Take 1 tablet (10 mg total) by mouth 2 (two) times daily. 60 tablet 0    gabapentin (NEURONTIN) 100 MG capsule Take 1 capsule (100 mg total) by mouth at bedtime. 30 capsule 10    nitrofurantoin, macrocrystal-monohydrate, (MACROBID) 100 MG capsule Take 100 mg by mouth 2 (two) times daily.      Prenatal Vit-Fe Fumarate-FA (PRENATAL MULTIVITAMIN) TABS tablet Take 1 tablet by mouth daily at 12 noon.       Review of Systems  Constitutional:  Negative for chills, diaphoresis, fatigue and fever.  Eyes:  Negative for visual disturbance.  Respiratory:  Negative for shortness of breath.   Cardiovascular:  Negative for chest pain.  Gastrointestinal:  Negative for abdominal pain, constipation, diarrhea, nausea and vomiting.  Genitourinary:  Positive for pelvic pain and vaginal bleeding. Negative for dysuria, flank pain, frequency, urgency and vaginal discharge.  Musculoskeletal:  Positive for back pain.  Neurological:  Negative for dizziness, weakness, light-headedness and headaches.   Physical Exam   Blood pressure 118/62, pulse 91, temperature 97.8 F (36.6 C), temperature source Oral, resp. rate 19, height '5\' 7"'$  (1.702 m), weight 102.8 kg, last menstrual period 04/19/2021, SpO2 99 %.  Patient Vitals for the past 24 hrs:  BP Temp Temp src Pulse Resp SpO2 Height Weight  12/23/21 1927 118/62 97.8 F (36.6 C) Oral 91 19 99 % '5\' 7"'$  (1.702 m) 102.8 kg   Physical Exam Exam conducted with a chaperone present.  Constitutional:      General: She is not in acute distress.     Appearance: She is well-developed. She is not diaphoretic.  HENT:     Head: Normocephalic and atraumatic.  Pulmonary:     Effort: Pulmonary effort is normal.  Abdominal:     General: There is no distension.     Palpations: Abdomen is soft. There is no mass.     Tenderness: There is no abdominal tenderness. There is no right CVA tenderness, left CVA tenderness, guarding or rebound.  Genitourinary:  General: Normal vulva.     Labia:        Right: No rash, tenderness or lesion.        Left: No rash, tenderness or lesion.      Vagina: Normal.     Cervix: Friability present.  Skin:    General: Skin is warm and dry.  Neurological:     Mental Status: She is alert and oriented to person, place, and time.  Psychiatric:        Mood and Affect: Mood normal.        Behavior: Behavior normal.        Thought Content: Thought content normal.        Judgment: Judgment normal.   Results for orders placed or performed during the hospital encounter of 12/23/21 (from the past 24 hour(s))  Urinalysis, Routine w reflex microscopic Urine, Clean Catch     Status: Abnormal   Collection Time: 12/23/21  7:33 PM  Result Value Ref Range   Color, Urine AMBER (A) YELLOW   APPearance CLOUDY (A) CLEAR   Specific Gravity, Urine 1.024 1.005 - 1.030   pH 5.0 5.0 - 8.0   Glucose, UA 50 (A) NEGATIVE mg/dL   Hgb urine dipstick SMALL (A) NEGATIVE   Bilirubin Urine NEGATIVE NEGATIVE   Ketones, ur NEGATIVE NEGATIVE mg/dL   Protein, ur 30 (A) NEGATIVE mg/dL   Nitrite NEGATIVE NEGATIVE   Leukocytes,Ua LARGE (A) NEGATIVE   RBC / HPF >50 (H) 0 - 5 RBC/hpf   WBC, UA >50 (H) 0 - 5 WBC/hpf   Bacteria, UA MANY (A) NONE SEEN   Squamous Epithelial / LPF 21-50 0 - 5   Mucus PRESENT    Non Squamous Epithelial 0-5 (A) NONE SEEN   US RENAL  Result Date: 12/20/2021 CLINICAL DATA:  Right flank pain, UTI.  Thirty-two weeks ago EXAM: RENAL / URINARY TRACT ULTRASOUND COMPLETE COMPARISON:  08/18/2021 FINDINGS: Right Kidney:  Renal measurements: 10.1 x 5.1 x 4.6 cm = volume: 123 mL. Mild right hydronephrosis, likely related to advanced gestational age. No mass. Normal echotexture. Left Kidney: Renal measurements: 10.0 x 5.5 x 6.1 cm = volume: 179 mL. Echogenicity within normal limits. No mass or hydronephrosis visualized. Bladder: Appears normal for degree of bladder distention. Other: None. IMPRESSION: Mild right hydronephrosis, likely related to advanced gestational age. Electronically Signed   By: Rolm Baptise M.D.   On: 12/20/2021 21:09   [place] MFM FETAL BPP WO NON STRESS  Result Date: 12/09/2021 ----------------------------------------------------------------------  OBSTETRICS REPORT                        (Signed Final 12/09/2021 08:31 am) ---------------------------------------------------------------------- Patient Info  ID #:       [id number]                          D.O.B.:  01/28/1991 (30 yrs)  Name:       Leonidas Romberg                  Visit Date: 12/08/2021 12:57 pm ---------------------------------------------------------------------- Performed By  Attending:        Sander Nephew      Ref. Address:      [address]  Performed By:     Jeanene Erb BS,      [facility]                    [place]  Referred By:      [facility] ---------------------------------------------------------------------- Orders  #  Description                           Code        Ordered By  1  [place] MFM FETAL BPP WO NON               76819.01    [facility]     STRESS ----------------------------------------------------------------------  #  Order #                     Accession #                Episode #  1  [id number]                   [id number]                 [id number] ---------------------------------------------------------------------- Indications  Decreased fetal movement                         Q76.2263  Obesity complicating pregnancy, third           O99.213  trimester (BMI 33)  [redacted] weeks gestation of pregnancy                 Z3A.30  LR NIPS/Neg [place] ---------------------------------------------------------------------- Fetal Evaluation  Num Of Fetuses:          1  Fetal Heart Rate(bpm):   149  Cardiac Activity:        Observed  Presentation:            Cephalic  Placenta:                Posterior  P. Cord Insertion:       Previously Visualized  Amniotic Fluid  AFI FV:      Within normal limits  AFI Sum(cm)     %Tile       Largest Pocket(cm)  10.28           17          3.98  RUQ(cm)                     LUQ(cm)        LLQ(cm)  3.98                        3.69           2.61 ---------------------------------------------------------------------- Biophysical Evaluation  Amniotic F.V:   Pocket => 2 cm             F. Tone:         Observed  F. Movement:    Observed                   Score:  6/8  F. Breathing:   Not Observed ---------------------------------------------------------------------- OB History  Gravidity:    6         Term:   1         SAB:   3  Ectopic:      1         Living: 1 ---------------------------------------------------------------------- Gestational Age  LMP:           33w 2d        Date:  04/19/21                 EDD:   01/24/22  Best:          Weston Settle 6d     Det. By:  Loman Chroman         EDD:   02/10/22                                      (06/25/21) ---------------------------------------------------------------------- Anatomy  Diaphragm:             Appears normal         Bladder:                Appears normal  Stomach:               Appears normal, left                         sided ---------------------------------------------------------------------- Cervix Uterus Adnexa  Cervix  Not visualized (advanced GA >24wks) ---------------------------------------------------------------------- Impression  Antenatal testing performed given decreased fetal  movement.  The biophysical profile was 8/8 with good fetal movement and  amniotic fluid volume. ---------------------------------------------------------------------- Recommendations  Management per inpatient provider ----------------------------------------------------------------------              Sander Nephew, MD Electronically Signed Final Report   12/09/2021 08:31 am ----------------------------------------------------------------------   MAU Course  Procedures  MDM -on speculum exam no evidence of vaginal bleeding -cervix visually closed -cervical friability noted -WetPrep: WNL -GC/CT collected  -bladder scan: 145m after voiding, no concern for urinary retention -no abdominal tenderness on exam -no CVA tenderness on exam  EFM: reactive       -baseline: 135       -variability: moderate       -accels: present, 15x15       -decels: absent       -TOCO: quiet  -Dilaudid '1mg'$  given in MAU, after administration, pt reports pain now 5/10 and significantly reduced, pt resting comfortably in bed -discussed with Dr. BElgie Congo with improvement of pain, nothing else to do at this time, pt can be discharged to home -pt discharged to home in stable condition  Orders Placed This Encounter  Procedures   Urinalysis, Routine w reflex microscopic Urine, Clean Catch    Standing Status:   Standing    Number of Occurrences:   1   Bladder scan    Standing Status:   Standing    Number of Occurrences:   1   Meds ordered this encounter  Medications   HYDROmorphone (DILAUDID) injection 1 mg   Assessment and Plan   1. Hydronephrosis of right kidney   2. [redacted] weeks gestation of pregnancy   3. NST (non-stress test) reactive   4. Friable cervix     Allergies as of 12/23/2021       Reactions   Ceclor [cefaclor] Hives  But has tolerated other cephalosporins, PCN, Augmetin   Morphine And Related Itching   Promethazine Nausea And Vomiting        Medication List     TAKE these  medications    aspirin EC 81 MG tablet Take 1 tablet (81 mg total) by mouth daily. Take after 12 weeks for prevention of preeclampsia later in pregnancy   calcium carbonate 500 MG chewable tablet Commonly known as: TUMS - dosed in mg elemental calcium Chew 1 tablet by mouth daily.   famotidine 10 MG tablet Commonly known as: PEPCID Take 1 tablet (10 mg total) by mouth 2 (two) times daily.   gabapentin 100 MG capsule Commonly known as: NEURONTIN Take 1 capsule (100 mg total) by mouth at bedtime.   nitrofurantoin (macrocrystal-monohydrate) 100 MG capsule Commonly known as: MACROBID Take 100 mg by mouth 2 (two) times daily.   ondansetron 4 MG disintegrating tablet Commonly known as: ZOFRAN-ODT Take 1 tablet (4 mg total) by mouth every 6 (six) hours as needed for nausea.   oxyCODONE 5 MG immediate release tablet Commonly known as: Roxicodone Take 1 tablet (5 mg total) by mouth every 4 (four) hours as needed for up to 3 days for severe pain.   prenatal multivitamin Tabs tablet Take 1 tablet by mouth daily at 12 noon.   tamsulosin 0.4 MG Caps capsule Commonly known as: FLOMAX Take 1 capsule (0.4 mg total) by mouth daily.       -perinatal MH resources given -discussed link between hydronephrosis and pain -return MAU precautions given -pt discharged to home in stable condition  Gerrie Nordmann Mohamed Portlock 12/23/2021, 8:01 PM

## 2021-12-23 NOTE — MAU Note (Signed)
Bladder scan performed  123m  Provider notified.

## 2021-12-23 NOTE — MAU Note (Addendum)
..  Danielle Evans is a 31 y.o. at 80w0dhere in MAU reporting: She has known kidney stones and has passed a piece but feels like the other one is stuck, moving around and if preventing her from voiding.  Pt reports she has finished her macrobid, is on flomax and oxy her last dose was 4 hours ago.  Also reports pasty white pink vaginal discharge.   +FM. Denies vaginal bleeding or ctx.   Pain score: 8/10 Vitals:   12/23/21 1927  BP: 118/62  Pulse: 91  Resp: 19  Temp: 97.8 F (36.6 C)  SpO2: 99%     FGYF:VCBSWHQin room  Lab orders placed from triage:  UA

## 2021-12-23 NOTE — Discharge Instructions (Signed)
Crisis Support 24/7  National Maternal Mental Health Hotline (English y Espanol): 0-102-VOZ-DGUY 414-706-2500)  Suicide and Crisis Lifeline (English y Espanol): Call or Text: 988 Website: CapitalTrip.com.cy  Maternal [place] 8am - 11pm: 252-732-1087 #1 En Espaol or #2 English Text "Help" to 548-695-7687 [facility]) Text en Espaol: 810-360-3016  Postpartum Support International Online Group Support: MarketLookup.fi  [place] (in-person or virtual visits for medication management and therapy): [phone] [address] [place], [place] [address]  Postpartum [place]: https://www.prctriad.com/ Has list of local perinatal mental health providers

## 2021-12-24 LAB — CULTURE, OB URINE: Culture: NO GROWTH

## 2021-12-24 LAB — GC/CHLAMYDIA PROBE AMP (~~LOC~~) NOT AT ARMC
Chlamydia: NEGATIVE
Comment: NEGATIVE
Comment: NORMAL
Neisseria Gonorrhea: NEGATIVE

## 2022-01-03 ENCOUNTER — Encounter: Payer: Self-pay | Admitting: Advanced Practice Midwife

## 2022-01-03 ENCOUNTER — Ambulatory Visit (INDEPENDENT_AMBULATORY_CARE_PROVIDER_SITE_OTHER): Payer: Medicaid Other | Admitting: Advanced Practice Midwife

## 2022-01-03 VITALS — BP 111/69 | HR 93 | Wt 233.0 lb

## 2022-01-03 DIAGNOSIS — Z3480 Encounter for supervision of other normal pregnancy, unspecified trimester: Secondary | ICD-10-CM

## 2022-01-03 DIAGNOSIS — B009 Herpesviral infection, unspecified: Secondary | ICD-10-CM | POA: Insufficient documentation

## 2022-01-03 DIAGNOSIS — O98513 Other viral diseases complicating pregnancy, third trimester: Secondary | ICD-10-CM

## 2022-01-03 DIAGNOSIS — O99013 Anemia complicating pregnancy, third trimester: Secondary | ICD-10-CM

## 2022-01-03 DIAGNOSIS — Z3A34 34 weeks gestation of pregnancy: Secondary | ICD-10-CM

## 2022-01-03 DIAGNOSIS — R109 Unspecified abdominal pain: Secondary | ICD-10-CM

## 2022-01-03 DIAGNOSIS — Z3483 Encounter for supervision of other normal pregnancy, third trimester: Secondary | ICD-10-CM

## 2022-01-03 MED ORDER — DOCUSATE SODIUM 100 MG PO CAPS: 100.0000 mg | ORAL_CAPSULE | Freq: Two times a day (BID) | ORAL | 0 refills | Status: DC

## 2022-01-03 MED ORDER — VALACYCLOVIR HCL 500 MG PO TABS: 500.0000 mg | ORAL_TABLET | Freq: Two times a day (BID) | ORAL | 6 refills | Status: AC

## 2022-01-03 MED ORDER — POLYSACCHARIDE IRON COMPLEX 150 MG PO CAPS: 150.0000 mg | ORAL_CAPSULE | Freq: Every day | ORAL | 3 refills | Status: AC

## 2022-01-03 NOTE — Progress Notes (Signed)
   PRENATAL VISIT NOTE  Subjective:  Danielle Evans is a 31 y.o. O8C1660 at 63w4dbeing seen today for ongoing prenatal care.  She is currently monitored for the following issues for this low-risk pregnancy and has Attention and concentration deficit; Supervision of other normal pregnancy, antepartum; Hydronephrosis of right kidney; Friable cervix; and HSV-2 infection complicating pregnancy, third trimester on their problem list.  Patient reports no complaints.  Contractions: Irritability. Vag. Bleeding: None.  Movement: Present. Denies leaking of fluid.   Patient reports feeling exhausted but within what she expected for this stage of pregnancy. She endorses eating ice "all day long" to the point that her tongue is cold and she has difficulty talking. She estimates 5 cups of ice during the day.   The following portions of the patient's history were reviewed and updated as appropriate: allergies, current medications, past family history, past medical history, past social history, past surgical history and problem list. Problem list updated.  Objective:   Vitals:   01/03/22 0952  BP: 111/69  Pulse: 93  Weight: 233 lb (105.7 kg)    Fetal Status: Fetal Heart Rate (bpm): 134 Fundal Height: 35 cm Movement: Present     General:  Alert, oriented and cooperative. Patient is in no acute distress.  Skin: Skin is warm and dry. No rash noted.   Cardiovascular: Normal heart rate noted  Respiratory: Normal respiratory effort, no problems with respiration noted  Abdomen: Soft, gravid, appropriate for gestational age.  Pain/Pressure: Present     Pelvic: Cervical exam deferred        Extremities: Normal range of motion.  Edema: Trace  Mental Status: Normal mood and affect. Normal behavior. Normal judgment and thought content.   Assessment and Plan:  Pregnancy: [id number] 317w4d1. Supervision of other normal pregnancy, antepartum - Routine care  2. Anemia during pregnancy in third trimester -  Hgb 10. 12/20/2021, now symptomatic, advised PO iron every other day - Continue great hydration with water, Colace as needed for constipation - iron polysaccharides (NIFEREX) 150 MG capsule; Take 1 capsule (150 mg total) by mouth daily.  Dispense: 30 capsule; Refill: 3 - docusate sodium (COLACE) 100 MG capsule; Take 1 capsule (100 mg total) by mouth 2 (two) times daily.  Dispense: 10 capsule; Refill: 0  3. Right flank pain - S/p MAU evaluation x 2. Pain resolved last week - RESOLVED  4. HSV-2 infection complicating pregnancy, third trimester - Last outbreak in 2018 - Being daily suppression - valACYclovir (VALTREX) 500 MG tablet; Take 1 tablet (500 mg total) by mouth 2 (two) times daily.  Dispense: 60 tablet; Refill: 6  Preterm labor symptoms and general obstetric precautions including but not limited to vaginal bleeding, contractions, leaking of fluid and fetal movement were reviewed in detail with the patient. Please refer to After Visit Summary for other counseling recommendations.   Future Appointments  Date Time Provider [place]9/21/2023  9:35 AM WeDarlina RumpfCNM [facility]  01/24/2022  8:35 AM AnHarolyn RutherfordUgSallyanne HaversMD [facility]  01/31/2022  9:35 AM WeDarlina RumpfCNM [facility]  02/07/2022  8:35 AM AnHarolyn RutherfordUgSallyanne HaversMD [facility]  02/14/2022 10:35 AM WeDarlina RumpfCNM [facility]    SaDarlina RumpfCNM

## 2022-01-10 ENCOUNTER — Encounter: Payer: Self-pay | Admitting: Advanced Practice Midwife

## 2022-01-17 ENCOUNTER — Other Ambulatory Visit (HOSPITAL_COMMUNITY)
Admission: RE | Admit: 2022-01-17 | Discharge: 2022-01-17 | Disposition: A | Discharge status: Home / Self Care | Payer: Medicaid Other | Source: Ambulatory Visit | Attending: Advanced Practice Midwife | Admitting: Advanced Practice Midwife

## 2022-01-17 ENCOUNTER — Ambulatory Visit (INDEPENDENT_AMBULATORY_CARE_PROVIDER_SITE_OTHER): Payer: Medicaid Other | Admitting: Advanced Practice Midwife

## 2022-01-17 VITALS — BP 113/72 | HR 81 | Wt 237.0 lb

## 2022-01-17 DIAGNOSIS — O99013 Anemia complicating pregnancy, third trimester: Secondary | ICD-10-CM

## 2022-01-17 DIAGNOSIS — Z3A36 36 weeks gestation of pregnancy: Secondary | ICD-10-CM | POA: Diagnosis present

## 2022-01-17 DIAGNOSIS — N930 Postcoital and contact bleeding: Secondary | ICD-10-CM

## 2022-01-17 DIAGNOSIS — O2243 Hemorrhoids in pregnancy, third trimester: Secondary | ICD-10-CM

## 2022-01-17 DIAGNOSIS — B009 Herpesviral infection, unspecified: Secondary | ICD-10-CM

## 2022-01-17 DIAGNOSIS — Z3480 Encounter for supervision of other normal pregnancy, unspecified trimester: Secondary | ICD-10-CM

## 2022-01-17 DIAGNOSIS — Z3483 Encounter for supervision of other normal pregnancy, third trimester: Secondary | ICD-10-CM | POA: Insufficient documentation

## 2022-01-17 DIAGNOSIS — O98513 Other viral diseases complicating pregnancy, third trimester: Secondary | ICD-10-CM

## 2022-01-17 MED ORDER — DOCUSATE SODIUM 100 MG PO CAPS: 100.0000 mg | ORAL_CAPSULE | Freq: Two times a day (BID) | ORAL | 2 refills | Status: AC

## 2022-01-17 MED ORDER — OMEPRAZOLE 20 MG PO CPDR
20.0000 mg | DELAYED_RELEASE_CAPSULE | Freq: Every day | ORAL | 2 refills | Status: DC
Start: 2022-01-17 — End: 2022-02-02

## 2022-01-17 MED ORDER — POLYETHYLENE GLYCOL 3350 17 G PO PACK: 17.0000 g | PACK | Freq: Every day | ORAL | 0 refills | Status: DC

## 2022-01-17 NOTE — Progress Notes (Signed)
ROB [redacted]w[redacted]d Had spotting after intercourse.none today.   Pt asked about U/S if anymore would be done.   CC: Back pain

## 2022-01-17 NOTE — Progress Notes (Signed)
   PRENATAL VISIT NOTE  Subjective:  Danielle Evans is a 31 y.o. J1B1478 at 78w4dbeing seen today for ongoing prenatal care.  She is currently monitored for the following issues for this low-risk pregnancy and has Attention and concentration deficit; Supervision of other normal pregnancy, antepartum; Hydronephrosis of right kidney; Friable cervix; and HSV-2 infection complicating pregnancy, third trimester on their problem list.  Patient reports  braxton hicks and one episode of postcoital spotting .  Contractions: Irritability. Vag. Bleeding: None.  Movement: Present. Denies leaking of fluid.   Patient also reports that her iron supplement is iitating her hemorrhoids. She requests medication for this complaint.  The following portions of the patient's history were reviewed and updated as appropriate: allergies, current medications, past family history, past medical history, past social history, past surgical history and problem list. Problem list updated.  Objective:   Vitals:   01/17/22 1005  BP: 113/72  Pulse: 81  Weight: 237 lb (107.5 kg)    Fetal Status: Fetal Heart Rate (bpm): 150 Fundal Height: 36 cm Movement: Present     General:  Alert, oriented and cooperative. Patient is in no acute distress.  Skin: Skin is warm and dry. No rash noted.   Cardiovascular: Normal heart rate noted  Respiratory: Normal respiratory effort, no problems with respiration noted  Abdomen: Soft, gravid, appropriate for gestational age.  Pain/Pressure: Present     Pelvic: Cervical exam performed Dilation: Fingertip Effacement (%): Thick Station: Ballotable  Extremities: Normal range of motion.  Edema: Trace  Mental Status: Normal mood and affect. Normal behavior. Normal judgment and thought content.   Assessment and Plan:  Pregnancy: [id number] 361w4d1. Supervision of other normal pregnancy, antepartum - Routine care, KEPhilipresent today!!! - Encouraged to start delivery  planning!!  2. [redacted] weeks gestation of pregnancy  - Strep Gp B NAA - Cervicovaginal ancillary only( [place] 3. Postcoital bleeding - x 1 episode in third trimester - Posterior placenta  4. Anemia during pregnancy in third trimester   5. Hemorrhoids in pregnancy in third trimester - Uncomplicated - docusate sodium (COLACE) 100 MG capsule; Take 1 capsule (100 mg total) by mouth 2 (two) times daily.  Dispense: 60 capsule; Refill: 2 - polyethylene glycol (MIRALAX) 17 g packet; Take 17 g by mouth daily.  Dispense: 14 each; Refill: 0  6. HSV-2 infection complicating pregnancy, third trimester - On suppression  Preterm labor symptoms and general obstetric precautions including but not limited to vaginal bleeding, contractions, leaking of fluid and fetal movement were reviewed in detail with the patient. Please refer to After Visit Summary for other counseling recommendations.    Future Appointments  Date Time Provider [place]9/28/2023  8:35 AM Anyanwu, UgSallyanne HaversMD [facility]  01/31/2022  9:35 AM WeDarlina RumpfCNM [facility]  02/07/2022  8:35 AM AnHarolyn RutherfordUgSallyanne HaversMD [facility]  02/14/2022 10:35 AM WeDarlina RumpfCNM [facility]    SaDarlina RumpfCNM

## 2022-01-18 LAB — CERVICOVAGINAL ANCILLARY ONLY
Chlamydia: NEGATIVE
Comment: NEGATIVE
Comment: NORMAL
Neisseria Gonorrhea: NEGATIVE

## 2022-01-19 LAB — STREP GP B NAA: Strep Gp B NAA: NEGATIVE

## 2022-01-24 ENCOUNTER — Encounter: Payer: Self-pay | Admitting: Advanced Practice Midwife

## 2022-01-24 ENCOUNTER — Ambulatory Visit (INDEPENDENT_AMBULATORY_CARE_PROVIDER_SITE_OTHER): Payer: Medicaid Other | Admitting: Obstetrics & Gynecology

## 2022-01-24 ENCOUNTER — Encounter: Payer: Self-pay | Admitting: Obstetrics & Gynecology

## 2022-01-24 VITALS — BP 114/74 | HR 83 | Wt 240.0 lb

## 2022-01-24 DIAGNOSIS — O99013 Anemia complicating pregnancy, third trimester: Secondary | ICD-10-CM

## 2022-01-24 DIAGNOSIS — B009 Herpesviral infection, unspecified: Secondary | ICD-10-CM

## 2022-01-24 DIAGNOSIS — O98513 Other viral diseases complicating pregnancy, third trimester: Secondary | ICD-10-CM

## 2022-01-24 DIAGNOSIS — O3663X0 Maternal care for excessive fetal growth, third trimester, not applicable or unspecified: Secondary | ICD-10-CM

## 2022-01-24 DIAGNOSIS — O2243 Hemorrhoids in pregnancy, third trimester: Secondary | ICD-10-CM

## 2022-01-24 DIAGNOSIS — Z3480 Encounter for supervision of other normal pregnancy, unspecified trimester: Secondary | ICD-10-CM

## 2022-01-24 DIAGNOSIS — Z3A37 37 weeks gestation of pregnancy: Secondary | ICD-10-CM

## 2022-01-24 LAB — CBC
Hematocrit: 30.5 % — ABNORMAL LOW (ref 34.0–46.6)
Hemoglobin: 9.6 g/dL — ABNORMAL LOW (ref 11.1–15.9)
MCH: 24.7 pg — ABNORMAL LOW (ref 26.6–33.0)
MCHC: 31.5 g/dL (ref 31.5–35.7)
MCV: 78 fL — ABNORMAL LOW (ref 79–97)
Platelets: 195 10*3/uL (ref 150–450)
RBC: 3.89 x10E6/uL (ref 3.77–5.28)
RDW: 13.3 % (ref 11.7–15.4)
WBC: 8 10*3/uL (ref 3.4–10.8)

## 2022-01-24 MED ORDER — HYDROCORTISONE ACETATE 25 MG RE SUPP: 25.0000 mg | Freq: Two times a day (BID) | RECTAL | 1 refills | Status: DC

## 2022-01-24 MED ORDER — HYDROCORTISONE (PERIANAL) 2.5 % EX CREA: TOPICAL_CREAM | Freq: Two times a day (BID) | CUTANEOUS | 2 refills | Status: AC

## 2022-01-24 NOTE — Patient Instructions (Signed)
Return to office for any scheduled appointments. Call the office or go to the MAU at [facility] if: You begin to have strong, frequent contractions Your water breaks.  Sometimes it is a big gush of fluid, sometimes it is just a trickle that keeps getting your underwear wet or running down your legs You have vaginal bleeding.  It is normal to have a small amount of spotting if your cervix was checked.  You do not feel your baby moving like normal.  If you do not, get something to eat and drink and lay down and focus on feeling your baby move.   If your baby is still not moving like normal, you should call the office or go to MAU. Any other obstetric concerns.  

## 2022-01-24 NOTE — Progress Notes (Signed)
   PRENATAL VISIT NOTE  Subjective:  Danielle Evans is a 31 y.o. [id number] at 63w4dbeing seen today for ongoing prenatal care.  She is currently monitored for the following issues for this low-risk pregnancy and has Attention and concentration deficit; Supervision of other normal pregnancy, antepartum; Hydronephrosis of right kidney; Friable cervix; and HSV-2 infection complicating pregnancy, third trimester on their problem list.  Patient reports  new iron prescription is causing stomach issues and hemorrhoids. Desires medication for hemorrhoids .  Contractions: Irritability. Vag. Bleeding: None.  Movement: Present. Denies leaking of fluid.   The following portions of the patient's history were reviewed and updated as appropriate: allergies, current medications, past family history, past medical history, past social history, past surgical history and problem list.   Objective:   Vitals:   01/24/22 0839  BP: 114/74  Pulse: 83  Weight: 240 lb (108.9 kg)    Fetal Status: Fetal Heart Rate (bpm): 144 Fundal Height: 40 cm Movement: Present  Presentation: Vertex  General:  Alert, oriented and cooperative. Patient is in no acute distress.  Skin: Skin is warm and dry. No rash noted.   Cardiovascular: Normal heart rate noted  Respiratory: Normal respiratory effort, no problems with respiration noted  Abdomen: Soft, gravid, appropriate for gestational age.  Pain/Pressure: Present     Pelvic: Cervical exam performed in the presence of a chaperone Dilation: 1 Effacement (%): Thick Station: Ballotable  Extremities: Normal range of motion.  Edema: Trace  Mental Status: Normal mood and affect. Normal behavior. Normal judgment and thought content.   Assessment and Plan:  Pregnancy: [id number] 330w4d. Large for dates affecting management of mother, third trimester, not applicable or unspecified fetus Growth scan ordered, will follow up results and manage accordingly. EFW was 84% in June 2023. - [place]; Future  2. Hemorrhoids in pregnancy in third trimester Anusol-HC ordered for patient.  - hydrocortisone (ANUSOL-HC) 2.5 % rectal cream; Place rectally 2 (two) times daily.  Dispense: 30 g; Refill: 2  3. Anemia complicating pregnancy, third trimester Will check CBC. If still low, consider IV iron therapy instead. - CBC  4. HSV-2 infection complicating pregnancy, third trimester Continue Valtrex suppression.  5. [redacted] weeks gestation of pregnancy 6. Supervision of other normal pregnancy, antepartum Negative pelvic cultures last visit.  Term labor symptoms and general obstetric precautions including but not limited to vaginal bleeding, contractions, leaking of fluid and fetal movement were reviewed in detail with the patient. Please refer to After Visit Summary for other counseling recommendations.   Return in about 1 week (around 01/31/2022) for OFFICE OB VISIT (MD only).  Future Appointments  Date Time Provider [place]10/08/2021  9:35 AM WeDarlina RumpfCNM [facility]  02/07/2022  8:35 AM Jadavion Spoelstra, UgSallyanne HaversMD [facility]  02/14/2022 10:35 AM WeDarlina RumpfCNM [facility]    UgVerita SchneidersMD

## 2022-01-25 ENCOUNTER — Other Ambulatory Visit: Payer: Self-pay

## 2022-01-25 NOTE — Addendum Note (Signed)
Addended by: Verita Schneiders A on: 01/25/2022 10:21 AM   Modules accepted: Orders

## 2022-01-28 ENCOUNTER — Ambulatory Visit: Payer: Medicaid Other | Admitting: *Deleted

## 2022-01-28 ENCOUNTER — Encounter (HOSPITAL_COMMUNITY): Payer: Self-pay | Admitting: Family Medicine

## 2022-01-28 ENCOUNTER — Ambulatory Visit: Payer: Medicaid Other | Attending: Obstetrics & Gynecology

## 2022-01-28 ENCOUNTER — Other Ambulatory Visit: Payer: Self-pay

## 2022-01-28 ENCOUNTER — Other Ambulatory Visit: Payer: Self-pay | Admitting: *Deleted

## 2022-01-28 ENCOUNTER — Inpatient Hospital Stay (HOSPITAL_COMMUNITY)
Admission: AD | Admit: 2022-01-28 | Discharge: 2022-01-28 | Disposition: A | Discharge status: Home / Self Care | Payer: Medicaid Other | Attending: Family Medicine | Admitting: Family Medicine

## 2022-01-28 ENCOUNTER — Ambulatory Visit (HOSPITAL_BASED_OUTPATIENT_CLINIC_OR_DEPARTMENT_OTHER): Payer: Medicaid Other | Admitting: Maternal & Fetal Medicine

## 2022-01-28 ENCOUNTER — Encounter: Payer: Self-pay | Admitting: Advanced Practice Midwife

## 2022-01-28 ENCOUNTER — Encounter: Payer: Self-pay | Admitting: *Deleted

## 2022-01-28 DIAGNOSIS — O479 False labor, unspecified: Secondary | ICD-10-CM | POA: Diagnosis not present

## 2022-01-28 DIAGNOSIS — E669 Obesity, unspecified: Secondary | ICD-10-CM | POA: Diagnosis not present

## 2022-01-28 DIAGNOSIS — O471 False labor at or after 37 completed weeks of gestation: Secondary | ICD-10-CM | POA: Insufficient documentation

## 2022-01-28 DIAGNOSIS — Z3A38 38 weeks gestation of pregnancy: Secondary | ICD-10-CM

## 2022-01-28 DIAGNOSIS — O3661X0 Maternal care for excessive fetal growth, first trimester, not applicable or unspecified: Secondary | ICD-10-CM

## 2022-01-28 DIAGNOSIS — O99213 Obesity complicating pregnancy, third trimester: Secondary | ICD-10-CM

## 2022-01-28 DIAGNOSIS — O3663X0 Maternal care for excessive fetal growth, third trimester, not applicable or unspecified: Secondary | ICD-10-CM | POA: Diagnosis present

## 2022-01-28 HISTORY — DX: Depression, unspecified: F32.A

## 2022-01-28 HISTORY — DX: Herpesviral infection, unspecified: B00.9

## 2022-01-28 HISTORY — DX: Calculus of kidney: N20.0

## 2022-01-28 HISTORY — DX: Urinary tract infection, site not specified: N39.0

## 2022-01-28 HISTORY — DX: Anemia, unspecified: D64.9

## 2022-01-28 MED ORDER — ONDANSETRON HCL 4 MG PO TABS: 4.0000 mg | ORAL_TABLET | Freq: Three times a day (TID) | ORAL | 0 refills | Status: DC | PRN

## 2022-01-28 MED ORDER — ONDANSETRON 4 MG PO TBDP
4.0000 mg | ORAL_TABLET | Freq: Once | ORAL | Status: AC
  Administered 2022-01-28: 4 mg via ORAL
  Filled 2022-01-28: qty 1

## 2022-01-28 NOTE — MAU Note (Signed)
Danielle Evans is a 31 y.o. at 64w1dhere in MAU reporting: ctx since about 0630. They are every 7-10 minutes. No bleeding or LOF. +FM.  Onset of complaint: today  Pain score: 9/10  Vitals:   01/28/22 0919  BP: 128/70  Pulse: 99  Resp: 18  Temp: 97.7 F (36.5 C)  SpO2: 99%     FHT:142  Lab orders placed from triage: none

## 2022-01-28 NOTE — MAU Provider Note (Signed)
MAU Nurse labor check   S: [name] is a 31 y.o. [phone] at [redacted]w[redacted]d who presents to MAU today complaining contractions q 10 minutes. She denies vaginal bleeding. She denies LOF. She reports normal fetal movement.    O: BP 120/76   Pulse 76   Temp 97.7 F (36.5 C) (Oral)   Resp 18   Ht '5\' 7"'$  (1.702 m)   Wt 109 kg   LMP 04/19/2021 (Approximate)   SpO2 99%   BMI 37.62 kg/m   Cervical exam:  Dilation: 1 Effacement (%): 50 Cervical Position: Posterior Station: Ballotable Exam by:: jolynn   Fetal Monitoring: Baseline: 130 Variability: moderate Accelerations: present Decelerations: none Contractions: nonr   A: SIUP at 372w1dFalse labor  P: Discharge to home. StTruett MainlandDO 01/28/2022 10:03 AM

## 2022-01-29 ENCOUNTER — Encounter: Payer: Self-pay | Admitting: Advanced Practice Midwife

## 2022-01-29 NOTE — Progress Notes (Signed)
MFM Consult Note Patient Name: Danielle Evans  Patient MRN:   [id number]  Referring provider: [facility]  Reason for Consult: LGA   HPI: Danielle Evans is a 31 y.o. [id number] at 24w1dhere for ultrasound and consultation.   On today's ultrasound the dimension fetal weight is approximately 4400 g which is greater than the 99th percentile.  The amniotic fluid level is normal.  She passed her Glucola test.  Her previous child was 8 and half pounds and she had minimal vaginal lacerations and delivered with only 3 pushes.  I discussed that the average fetus gains about 200-300 g a week but this can be  more with large for gestational age fetuses.  I also discussed the limitations of ultrasound and the estimated fetal weight can be off by as much as a pound at this point in her pregnancy.  I discussed the concern for suspected macrosomia and the indication for cesarean delivery if at the time of delivery the fetus is estimated to be greater than 5000 g.  I discussed that is impossible to know if precision of this would be the case for her fetus I discussed that the concern for a large baby is the potential for a shoulder dystocia resulting in brachial plexus injury or hypoxia leading to long-term disability and even death.  I discussed that a cesarean delivery would be acceptable to reduce this risk.  After shared decision making we discussed that it would be reasonable to have membrane stripping done this week and a vaginal delivery can be attempted if she achieved spontaneous labor prior to 39 weeks. If she has not achieved spontaneous labor by 39 weeks she has request a cesarean delivery be scheduled at the 39th week of pregnancy to reduce the risk of birth trauma given the suspected macrosomia at 39 weeks.  Review of Systems: A review of systems was performed and was negative except per HPI   Vitals and Physical Exam See intake sheet for vitals Sitting comfortably on the sonogram table Nonlabored  breathing Normal rate and rhythm Abdomen is nontender  Sonographic findings Single intrauterine pregnancy at 38w 1d. Observed fetal cardiac activity. Cephalic presentation. Interval fetal anatomy appears normal.  Fetal biometry shows the estimated fetal weight at the > 99 percentile (4,406 g) Amniotic fluid volume: Within normal limits. AFI: 11.6 cm.  MVP: 4.1 cm. Placenta: Posterior.  Assessment -Large for gestational age Plan -I have contacted the patient's provider to see if she can have membrane stripping done in the office in the next few days.  If she goes into spontaneous labor prior to 39 weeks I think a vaginal delivery is acceptable however, if she does not achieve spontaneous labor by 39 weeks then a cesarean delivery is preferred because at this time I suspect the fetus could be over 5000 g at that time.  Also discussed that induction of labor is not indicated for suspected macrosomia before 39 weeks.  I spent 30 minutes reviewing the patients chart, including labs and images as well as counseling the patient about her medical condition.   BValeda Malm MFM, [place]  01/29/2022  8:28 AM

## 2022-01-30 ENCOUNTER — Encounter: Payer: Self-pay | Admitting: Advanced Practice Midwife

## 2022-01-30 ENCOUNTER — Inpatient Hospital Stay (HOSPITAL_COMMUNITY): Admission: RE | Admit: 2022-01-30 | Payer: Medicaid Other | Source: Ambulatory Visit

## 2022-01-30 ENCOUNTER — Inpatient Hospital Stay (HOSPITAL_COMMUNITY)
Admission: AD | Admit: 2022-01-30 | Discharge: 2022-01-30 | Disposition: A | Discharge status: Home / Self Care | Payer: Medicaid Other | Attending: Obstetrics and Gynecology | Admitting: Obstetrics and Gynecology

## 2022-01-30 ENCOUNTER — Encounter (HOSPITAL_COMMUNITY): Payer: Self-pay | Admitting: Obstetrics and Gynecology

## 2022-01-30 DIAGNOSIS — O479 False labor, unspecified: Secondary | ICD-10-CM

## 2022-01-30 DIAGNOSIS — O99891 Other specified diseases and conditions complicating pregnancy: Secondary | ICD-10-CM | POA: Diagnosis not present

## 2022-01-30 DIAGNOSIS — R197 Diarrhea, unspecified: Secondary | ICD-10-CM | POA: Diagnosis not present

## 2022-01-30 DIAGNOSIS — O3660X0 Maternal care for excessive fetal growth, unspecified trimester, not applicable or unspecified: Secondary | ICD-10-CM

## 2022-01-30 DIAGNOSIS — O471 False labor at or after 37 completed weeks of gestation: Secondary | ICD-10-CM | POA: Insufficient documentation

## 2022-01-30 DIAGNOSIS — O98513 Other viral diseases complicating pregnancy, third trimester: Secondary | ICD-10-CM | POA: Diagnosis not present

## 2022-01-30 DIAGNOSIS — Z3A38 38 weeks gestation of pregnancy: Secondary | ICD-10-CM | POA: Diagnosis not present

## 2022-01-30 DIAGNOSIS — D649 Anemia, unspecified: Secondary | ICD-10-CM

## 2022-01-30 LAB — POCT FERN TEST: POCT Fern Test: NEGATIVE

## 2022-01-30 LAB — AMNISURE RUPTURE OF MEMBRANE (ROM) NOT AT ARMC: Amnisure ROM: NEGATIVE

## 2022-01-30 MED ORDER — OXYCODONE-ACETAMINOPHEN 5-325 MG PO TABS
2.0000 | ORAL_TABLET | Freq: Once | ORAL | Status: AC
  Administered 2022-01-30: 2 via ORAL
  Filled 2022-01-30: qty 2

## 2022-01-30 MED ORDER — ZOLPIDEM TARTRATE 5 MG PO TABS: 10.0000 mg | ORAL_TABLET | Freq: Once | ORAL | Status: DC

## 2022-01-30 MED ORDER — ONDANSETRON 4 MG PO TBDP
4.0000 mg | ORAL_TABLET | Freq: Once | ORAL | Status: AC
  Administered 2022-01-30: 4 mg via ORAL
  Filled 2022-01-30: qty 1

## 2022-01-30 NOTE — MAU Provider Note (Signed)
Chief Complaint:  Contractions and Rupture of Membranes   None     HPI: Danielle Evans is a 31 y.o. [id number] at 38w3dby LMP who presents to maternity admissions reporting painful contractions, leaking fluid enough to wet her underwear, and nausea and diarrhea x 2 days.  She reports concerns that MFM thinks the baby is large and she may need a cesarean delivery. Contractions started at 8 am and have been persistent all day.  She is becoming exhausted but cannot sleep due to the pain.   She reports good fetal movement.    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   Ovarian cyst    Pap smear abnormality of cervix 2014   Pneumonia    Sleep apnea    childhood, adenoidectomy   UTI (urinary tract infection)     Past obstetric history: OB History  Gravida Para Term Preterm AB Living  '6 1 1   4 1  '$ SAB IAB Ectopic Multiple Live Births  3   1        # Outcome Date GA Lbr Len/2nd Weight Sex Delivery Anes PTL Lv  6 Current           5 SAB 03/2020          4 Term 03/30/17 374w6d M Vag-Spont     3 SAB 02/2015 1181w0d      2 Ectopic 2015 10w55w0d     1 SAB 2011 77w0d69w0d      Past Surgical History: Past Surgical History:  Procedure Laterality Date   DILATION AND CURETTAGE OF UTERUS     LAPAROSCOPIC UNILATERAL SALPINGECTOMY Left 11/21/2015   Procedure: LAPAROSCOPIC UNILATERAL SALPINGECTOMY;  Surgeon: JamesWoodroe Mode  [facility]  Service: Gynecology;  Laterality: Left;   LAPAROSCOPY N/A 11/21/2015   Procedure: LAPAROSCOPY DIAGNOSTIC;  Surgeon: JamesWoodroe Mode  [facility]  Service: Gynecology;  Laterality: N/A;   NASAL  SINUS SURGERY     TONSILECTOMY, ADENOIDECTOMY, BILATERAL MYRINGOTOMY AND TUBES      Family History: Family History  Problem Relation Age of Onset   Sarcoidosis Mother    Healthy Father    Cancer Paternal Aunt        breast    Cancer Maternal Grandmother        breast   Parkinsonism Maternal Grandfather    Heart disease Paternal Grandfather     Social History: Social History   Tobacco Use   Smoking status: Former    Years: 3.00    Types: Cigarettes    Quit date: 10/23/2011    Years since quitting: 10.2   Smokeless tobacco: Never  Vaping Use   Vaping Use: Never used  Substance Use Topics   Alcohol use: No    Alcohol/week: 3.0 standard drinks of alcohol    Types: 3 Standard drinks or equivalent per week    Comment: not with preg   Drug use: No    Comment: hx  of marijuana, not with preg    Allergies:  Allergies  Allergen Reactions   Ceclor [Cefaclor] Hives    But has tolerated other cephalosporins, PCN, Augmetin   Morphine And Related Itching   Promethazine Nausea And Vomiting    Meds:  Medications Prior to Admission  Medication Sig Dispense Refill Last Dose   calcium carbonate (TUMS - DOSED IN MG ELEMENTAL CALCIUM) 500 MG chewable tablet Chew 1 tablet by mouth daily.   01/30/2022   omeprazole (PRILOSEC) 20 MG capsule Take 1 capsule (20 mg total) by mouth daily. 30 capsule 2 Past Week   ondansetron (ZOFRAN) 4 MG tablet Take 1 tablet (4 mg total) by mouth every 8 (eight) hours as needed for nausea or vomiting. 20 tablet 0 01/30/2022   Prenatal Vit-Fe Fumarate-FA (PRENATAL MULTIVITAMIN) TABS tablet Take 1 tablet by mouth daily at 12 noon.   Past Week   valACYclovir (VALTREX) 500 MG tablet Take 1 tablet (500 mg total) by mouth 2 (two) times daily. 60 tablet 6 01/29/2022 at `   aspirin EC 81 MG tablet Take 1 tablet (81 mg total) by mouth daily. Take after 12 weeks for prevention of preeclampsia later in pregnancy 300 tablet 2    docusate sodium (COLACE) 100 MG capsule Take  1 capsule (100 mg total) by mouth 2 (two) times daily. 60 capsule 2    famotidine (PEPCID) 10 MG tablet Take 1 tablet (10 mg total) by mouth 2 (two) times daily. 60 tablet 0    gabapentin (NEURONTIN) 100 MG capsule Take 1 capsule (100 mg total) by mouth at bedtime. 30 capsule 10    hydrocortisone (ANUSOL-HC) 2.5 % rectal cream Place rectally 2 (two) times daily. 30 g 2    hydrocortisone (ANUSOL-HC) 25 MG suppository Place 1 suppository (25 mg total) rectally 2 (two) times daily. 24 suppository 1    iron polysaccharides (NIFEREX) 150 MG capsule Take 1 capsule (150 mg total) by mouth daily. 30 capsule 3     ROS:  Review of Systems  Constitutional:  Negative for chills, fatigue and fever.  Eyes:  Negative for visual disturbance.  Respiratory:  Negative for shortness of breath.   Cardiovascular:  Negative for chest pain.  Gastrointestinal:  Positive for abdominal pain, diarrhea and nausea. Negative for vomiting.  Genitourinary:  Negative for difficulty urinating, dysuria, flank pain, pelvic pain, vaginal bleeding, vaginal discharge and vaginal pain.  Musculoskeletal:  Positive for back pain.  Neurological:  Negative for dizziness and headaches.  Psychiatric/Behavioral: Negative.       I have reviewed patient's Past Medical Hx, Surgical Hx, Family Hx, Social Hx, medications and allergies.   Physical Exam  Patient Vitals for the past 24 hrs:  BP Temp Temp src Pulse Resp SpO2 Height Weight  01/30/22 1356 114/66 -- -- (!) 59 -- -- -- --  01/30/22 1319 132/74 97.8 F (36.6 C) Oral 68 15 98 % '5\' 7"'$  (1.702 m) 107.3 kg   Constitutional: Well-developed, well-nourished female in no acute distress.  Cardiovascular: normal rate Respiratory: normal effort GI: Abd soft, non-tender, gravid appropriate for gestational age.  MS: Extremities nontender, no edema, normal ROM Neurologic: Alert and oriented x 4.  GU: Neg CVAT.   Dilation: 2 Effacement (%): 50 Cervical Position: Posterior Station:  -3 Exam by:: Osvaldo Human, RN  FHT:  Baseline 135 , moderate variability, accelerations present, no decelerations Contractions: q 2-4 mins   Labs: Results for orders placed or performed during the hospital encounter of 01/30/22 (from the past  24 hour(s))  Amnisure rupture of membrane (rom)not at [facility]     Status: None   Collection Time: 01/30/22  4:08 PM  Result Value Ref Range   Amnisure ROM NEGATIVE    A/Positive/-- (03/28 1319)  Imaging:   MAU Course/MDM: Orders Placed This Encounter  Procedures   Amnisure rupture of membrane (rom)not at [facility]   Contraction - monitoring   External fetal heart monitoring   Vaginal exam   POCT fern test   Discharge patient    Meds ordered this encounter  Medications   ondansetron (ZOFRAN-ODT) disintegrating tablet 4 mg   DISCONTD: zolpidem (AMBIEN) tablet 10 mg   oxyCODONE-acetaminophen (PERCOCET/ROXICET) 5-325 MG per tablet 2 tablet     NST reviewed and reactive Pt with diarrhea at home yesterday and today but none since arrival in MAU Zofran 4 mg given for nausea and pt reports improved symptoms Pt with cervix unchanged in 2 plus hours in MAU Pt offered therapeutic rest with Ambien, she declined ambien as she has had vivid dreams with the medication Percocet 5/325 x 2 given D/C home with labor precautions Keep scheduled appt 10/5 in the office with Maryelizabeth Kaufmann, CNM     Assessment: 1. Braxton Hick's contraction   2. Diarrhea, unspecified type   3. [redacted] weeks gestation of pregnancy     Plan: Discharge home Labor precautions and fetal kick counts  Follow-up [place] for [place] at [facility] Follow up in 1 day(s).   Specialty: Obstetrics and Gynecology Why: As scheduled Contact information: [place] Follow up.   Specialty: Obstetrics and Gynecology Why: As needed for emergencies Contact  information: [address] [id number] [place] [phone]               Allergies as of 01/30/2022       Reactions   Ceclor [cefaclor] Hives   But has tolerated other cephalosporins, PCN, Augmetin   Morphine And Related Itching   Promethazine Nausea And Vomiting        Medication List     TAKE these medications    aspirin EC 81 MG tablet Take 1 tablet (81 mg total) by mouth daily. Take after 12 weeks for prevention of preeclampsia later in pregnancy   calcium carbonate 500 MG chewable tablet Commonly known as: TUMS - dosed in mg elemental calcium Chew 1 tablet by mouth daily.   docusate sodium 100 MG capsule Commonly known as: Colace Take 1 capsule (100 mg total) by mouth 2 (two) times daily.   famotidine 10 MG tablet Commonly known as: PEPCID Take 1 tablet (10 mg total) by mouth 2 (two) times daily.   gabapentin 100 MG capsule Commonly known as: NEURONTIN Take 1 capsule (100 mg total) by mouth at bedtime.   hydrocortisone 2.5 % rectal cream Commonly known as: ANUSOL-HC Place rectally 2 (two) times daily.   hydrocortisone 25 MG suppository Commonly known as: ANUSOL-HC Place 1 suppository (25 mg total) rectally 2 (two) times daily.   iron polysaccharides 150 MG capsule Commonly known as: NIFEREX Take 1 capsule (150 mg total) by mouth daily.   omeprazole 20 MG capsule Commonly known as: PRILOSEC Take 1 capsule (20 mg total) by mouth daily.   ondansetron 4 MG tablet Commonly known as: Zofran Take 1 tablet (4 mg total) by mouth every 8 (eight) hours as  needed for nausea or vomiting.   prenatal multivitamin Tabs tablet Take 1 tablet by mouth daily at 12 noon.   valACYclovir 500 MG tablet Commonly known as: VALTREX Take 1 tablet (500 mg total) by mouth 2 (two) times daily.        Fatima Blank Certified Nurse-Midwife 01/30/2022 4:51 PM

## 2022-01-30 NOTE — MAU Note (Addendum)
...  Danielle Evans is a 31 y.o. at 78w3dhere in MAU reporting: CTX over the past few days that increased this morning around 0745 that are currently 4-6 minutes apart. Denies VB but endorses intermittent leaking of fluids since yesterday. DFM "slightly." She reports she is "absolutely" still achieving 10 movements or more in 2 hours.    Onset of complaint: Today Pain score: 8/10 lower abdomen  FHT: 145 initial external Lab orders placed from triage: MAU Labor

## 2022-01-30 NOTE — Discharge Instructions (Signed)
Reasons to return to MAU at [facility]:  1.  Contractions are  5 minutes apart or less, each last 1 minute, these have been going on for 1-2 hours, and you cannot walk or talk during them 2.  You have a large gush of fluid, or a trickle of fluid that will not stop and you have to wear a pad 3.  You have bleeding that is bright red, heavier than spotting--like menstrual bleeding (spotting can be normal in early labor or after a check of your cervix) 4.  You do not feel the baby moving like he/she normally does  

## 2022-01-31 ENCOUNTER — Encounter (HOSPITAL_COMMUNITY): Payer: Self-pay | Admitting: Obstetrics and Gynecology

## 2022-01-31 ENCOUNTER — Inpatient Hospital Stay (HOSPITAL_COMMUNITY): Payer: Medicaid Other | Admitting: Anesthesiology

## 2022-01-31 ENCOUNTER — Encounter (HOSPITAL_COMMUNITY): Payer: Self-pay | Admitting: Obstetrics & Gynecology

## 2022-01-31 ENCOUNTER — Other Ambulatory Visit: Payer: Self-pay

## 2022-01-31 ENCOUNTER — Inpatient Hospital Stay (HOSPITAL_COMMUNITY)
Admission: AD | Admit: 2022-01-31 | Discharge: 2022-01-31 | Disposition: A | Discharge status: Home / Self Care | Payer: Medicaid Other | Source: Home / Self Care | Attending: Obstetrics & Gynecology | Admitting: Obstetrics & Gynecology

## 2022-01-31 ENCOUNTER — Encounter: Payer: Medicaid Other | Admitting: Advanced Practice Midwife

## 2022-01-31 ENCOUNTER — Other Ambulatory Visit: Payer: Self-pay | Admitting: Advanced Practice Midwife

## 2022-01-31 ENCOUNTER — Inpatient Hospital Stay (HOSPITAL_COMMUNITY)
Admission: AD | Admit: 2022-01-31 | Discharge: 2022-02-02 | DRG: 807 | Disposition: A | Discharge status: Home / Self Care | Payer: Medicaid Other | Attending: Obstetrics and Gynecology | Admitting: Obstetrics and Gynecology

## 2022-01-31 DIAGNOSIS — Z23 Encounter for immunization: Secondary | ICD-10-CM

## 2022-01-31 DIAGNOSIS — O26893 Other specified pregnancy related conditions, third trimester: Secondary | ICD-10-CM | POA: Diagnosis present

## 2022-01-31 DIAGNOSIS — Z3A38 38 weeks gestation of pregnancy: Secondary | ICD-10-CM

## 2022-01-31 DIAGNOSIS — Z87891 Personal history of nicotine dependence: Secondary | ICD-10-CM

## 2022-01-31 DIAGNOSIS — O3660X0 Maternal care for excessive fetal growth, unspecified trimester, not applicable or unspecified: Secondary | ICD-10-CM

## 2022-01-31 DIAGNOSIS — O3663X0 Maternal care for excessive fetal growth, third trimester, not applicable or unspecified: Principal | ICD-10-CM | POA: Diagnosis present

## 2022-01-31 DIAGNOSIS — O471 False labor at or after 37 completed weeks of gestation: Secondary | ICD-10-CM | POA: Insufficient documentation

## 2022-01-31 DIAGNOSIS — Z3480 Encounter for supervision of other normal pregnancy, unspecified trimester: Secondary | ICD-10-CM

## 2022-01-31 LAB — CBC
HCT: 32.2 % — ABNORMAL LOW (ref 36.0–46.0)
Hemoglobin: 9.9 g/dL — ABNORMAL LOW (ref 12.0–15.0)
MCH: 24.7 pg — ABNORMAL LOW (ref 26.0–34.0)
MCHC: 30.7 g/dL (ref 30.0–36.0)
MCV: 80.3 fL (ref 80.0–100.0)
Platelets: 182 10*3/uL (ref 150–400)
RBC: 4.01 MIL/uL (ref 3.87–5.11)
RDW: 14.1 % (ref 11.5–15.5)
WBC: 9.1 10*3/uL (ref 4.0–10.5)
nRBC: 0 % (ref 0.0–0.2)

## 2022-01-31 LAB — TYPE AND SCREEN
ABO/RH(D): A POS
Antibody Screen: NEGATIVE

## 2022-01-31 LAB — POCT FERN TEST: POCT Fern Test: NEGATIVE

## 2022-01-31 MED ORDER — LIDOCAINE-EPINEPHRINE (PF) 1.5 %-1:200000 IJ SOLN
INTRAMUSCULAR | Status: DC | PRN
  Administered 2022-01-31: 5 mL via EPIDURAL

## 2022-01-31 MED ORDER — OXYTOCIN BOLUS FROM INFUSION
333.0000 mL | Freq: Once | INTRAVENOUS | Status: AC
  Administered 2022-01-31: 333 mL via INTRAVENOUS

## 2022-01-31 MED ORDER — IBUPROFEN 600 MG PO TABS
600.0000 mg | ORAL_TABLET | Freq: Four times a day (QID) | ORAL | Status: DC
  Administered 2022-01-31 – 2022-02-02 (×7): 600 mg via ORAL
  Filled 2022-01-31 (×7): qty 1

## 2022-01-31 MED ORDER — FAMOTIDINE 20 MG PO TABS: 20.0000 mg | ORAL_TABLET | Freq: Every day | ORAL | Status: DC

## 2022-01-31 MED ORDER — FENTANYL-BUPIVACAINE-NACL 0.5-0.125-0.9 MG/250ML-% EP SOLN
12.0000 mL/h | EPIDURAL | Status: DC | PRN
  Administered 2022-01-31: 12 mL/h via EPIDURAL
  Filled 2022-01-31: qty 250

## 2022-01-31 MED ORDER — WITCH HAZEL-GLYCERIN EX PADS: 1.0000 | MEDICATED_PAD | CUTANEOUS | Status: DC | PRN

## 2022-01-31 MED ORDER — EPHEDRINE 5 MG/ML INJ: 10.0000 mg | INTRAVENOUS | Status: DC | PRN

## 2022-01-31 MED ORDER — OXYCODONE-ACETAMINOPHEN 5-325 MG PO TABS: 2.0000 | ORAL_TABLET | ORAL | Status: DC | PRN

## 2022-01-31 MED ORDER — NALBUPHINE HCL 10 MG/ML IJ SOLN
20.0000 mg | Freq: Once | INTRAMUSCULAR | Status: AC
  Administered 2022-01-31: 20 mg via INTRAMUSCULAR
  Filled 2022-01-31: qty 2

## 2022-01-31 MED ORDER — LACTATED RINGERS IV SOLN
500.0000 mL | Freq: Once | INTRAVENOUS | Status: AC
  Administered 2022-01-31: 500 mL via INTRAVENOUS

## 2022-01-31 MED ORDER — SOD CITRATE-CITRIC ACID 500-334 MG/5ML PO SOLN: 30.0000 mL | ORAL | Status: DC | PRN

## 2022-01-31 MED ORDER — ONDANSETRON HCL 4 MG/2ML IJ SOLN
4.0000 mg | INTRAMUSCULAR | Status: DC | PRN
  Administered 2022-02-01: 4 mg via INTRAVENOUS
  Filled 2022-01-31: qty 2

## 2022-01-31 MED ORDER — PRENATAL MULTIVITAMIN CH
1.0000 | ORAL_TABLET | Freq: Every day | ORAL | Status: DC
  Administered 2022-02-01 – 2022-02-02 (×2): 1 via ORAL
  Filled 2022-01-31 (×2): qty 1

## 2022-01-31 MED ORDER — LACTATED RINGERS IV SOLN: 500.0000 mL | INTRAVENOUS | Status: DC | PRN

## 2022-01-31 MED ORDER — OXYTOCIN-SODIUM CHLORIDE 30-0.9 UT/500ML-% IV SOLN
2.5000 [IU]/h | INTRAVENOUS | Status: DC
  Filled 2022-01-31: qty 500

## 2022-01-31 MED ORDER — LACTATED RINGERS IV SOLN: INTRAVENOUS | Status: DC

## 2022-01-31 MED ORDER — FAMOTIDINE 20 MG PO TABS
20.0000 mg | ORAL_TABLET | Freq: Once | ORAL | Status: AC
  Administered 2022-01-31: 20 mg via ORAL
  Filled 2022-01-31: qty 1

## 2022-01-31 MED ORDER — SIMETHICONE 80 MG PO CHEW: 80.0000 mg | CHEWABLE_TABLET | ORAL | Status: DC | PRN

## 2022-01-31 MED ORDER — ACETAMINOPHEN 325 MG PO TABS
650.0000 mg | ORAL_TABLET | ORAL | Status: DC | PRN
  Administered 2022-01-31 – 2022-02-02 (×7): 650 mg via ORAL
  Filled 2022-01-31 (×7): qty 2

## 2022-01-31 MED ORDER — PHENYLEPHRINE 80 MCG/ML (10ML) SYRINGE FOR IV PUSH (FOR BLOOD PRESSURE SUPPORT): 80.0000 ug | PREFILLED_SYRINGE | INTRAVENOUS | Status: DC | PRN

## 2022-01-31 MED ORDER — ONDANSETRON HCL 4 MG PO TABS: 4.0000 mg | ORAL_TABLET | ORAL | Status: DC | PRN

## 2022-01-31 MED ORDER — TETANUS-DIPHTH-ACELL PERTUSSIS 5-2.5-18.5 LF-MCG/0.5 IM SUSY: 0.5000 mL | PREFILLED_SYRINGE | Freq: Once | INTRAMUSCULAR | Status: DC

## 2022-01-31 MED ORDER — ZOLPIDEM TARTRATE 10 MG PO TABS: 10.0000 mg | ORAL_TABLET | Freq: Every evening | ORAL | 0 refills | Status: DC | PRN

## 2022-01-31 MED ORDER — ONDANSETRON HCL 4 MG/2ML IJ SOLN
4.0000 mg | Freq: Four times a day (QID) | INTRAMUSCULAR | Status: DC | PRN
  Administered 2022-01-31: 4 mg via INTRAVENOUS
  Filled 2022-01-31: qty 2

## 2022-01-31 MED ORDER — LIDOCAINE HCL (PF) 1 % IJ SOLN
INTRAMUSCULAR | Status: DC | PRN
  Administered 2022-01-31: 5 mL via EPIDURAL

## 2022-01-31 MED ORDER — BENZOCAINE-MENTHOL 20-0.5 % EX AERO
1.0000 | INHALATION_SPRAY | CUTANEOUS | Status: DC | PRN
  Filled 2022-01-31: qty 56

## 2022-01-31 MED ORDER — COCONUT OIL OIL: 1.0000 | TOPICAL_OIL | Status: DC | PRN

## 2022-01-31 MED ORDER — ACETAMINOPHEN 325 MG PO TABS: 650.0000 mg | ORAL_TABLET | ORAL | Status: DC | PRN

## 2022-01-31 MED ORDER — FENTANYL CITRATE (PF) 100 MCG/2ML IJ SOLN
100.0000 ug | INTRAMUSCULAR | Status: DC | PRN
  Administered 2022-01-31: 100 ug via INTRAVENOUS
  Filled 2022-01-31: qty 2

## 2022-01-31 MED ORDER — DIPHENHYDRAMINE HCL 50 MG/ML IJ SOLN: 12.5000 mg | INTRAMUSCULAR | Status: DC | PRN

## 2022-01-31 MED ORDER — SENNOSIDES-DOCUSATE SODIUM 8.6-50 MG PO TABS
2.0000 | ORAL_TABLET | Freq: Every day | ORAL | Status: DC
  Administered 2022-02-01 – 2022-02-02 (×2): 2 via ORAL
  Filled 2022-01-31 (×2): qty 2

## 2022-01-31 MED ORDER — ZOLPIDEM TARTRATE 5 MG PO TABS: 5.0000 mg | ORAL_TABLET | Freq: Every evening | ORAL | Status: DC | PRN

## 2022-01-31 MED ORDER — OXYCODONE-ACETAMINOPHEN 5-325 MG PO TABS: 1.0000 | ORAL_TABLET | ORAL | Status: DC | PRN

## 2022-01-31 MED ORDER — LIDOCAINE HCL (PF) 1 % IJ SOLN: 30.0000 mL | INTRAMUSCULAR | Status: DC | PRN

## 2022-01-31 MED ORDER — DIBUCAINE (PERIANAL) 1 % EX OINT: 1.0000 | TOPICAL_OINTMENT | CUTANEOUS | Status: DC | PRN

## 2022-01-31 MED ORDER — DIPHENHYDRAMINE HCL 25 MG PO CAPS: 25.0000 mg | ORAL_CAPSULE | Freq: Four times a day (QID) | ORAL | Status: DC | PRN

## 2022-01-31 NOTE — MAU Note (Signed)
.  Danielle Evans is a 31 y.o. at 31w4dhere in MAU reporting: was seen in MAU last night for labor eval. States ctx are still close together and more intense than they were last night. Reporting bloody show and still leaking fluid, unsure if it is urine. +FM  Pain score: 10

## 2022-01-31 NOTE — Anesthesia Procedure Notes (Signed)
Epidural Patient location during procedure: OB Start time: 01/31/2022 5:18 PM End time: 01/31/2022 5:28 PM  Staffing Anesthesiologist: Murvin Natal, MD Performed: anesthesiologist   Preanesthetic Checklist Completed: patient identified, IV checked, site marked, risks and benefits discussed, monitors and equipment checked, pre-op evaluation and timeout performed  Epidural Patient position: sitting Prep: DuraPrep Patient monitoring: heart rate, cardiac monitor, continuous pulse ox and blood pressure Approach: midline Location: L4-L5 Injection technique: LOR air  Needle:  Needle type: Tuohy  Needle gauge: 17 G Needle length: 9 cm Needle insertion depth: 7 cm Catheter type: closed end flexible Catheter size: 19 Gauge Catheter at skin depth: 13 cm Test dose: negative and 1.5% lidocaine with Epi 1:200 K  Assessment Events: blood not aspirated, injection not painful, no injection resistance and negative IV test  Additional Notes Informed consent obtained prior to proceeding including risk of failure, 1% risk of PDPH, risk of minor discomfort and bruising. Discussed alternatives to epidural analgesia and patient desires to proceed.  Timeout performed pre-procedure verifying patient name, procedure, and platelet count.  Patient tolerated procedure well. Reason for block:procedure for pain

## 2022-01-31 NOTE — MAU Note (Signed)
I have communicated with Deloria Lair, DO and Mikki Santee, MD and reviewed vital signs:  Vitals:   01/31/22 0135 01/31/22 0144  BP: 120/69 125/74  Pulse: 94 93  Resp: 18   Temp: 98.2 F (36.8 C)   SpO2: 99%     Vaginal exam:  Dilation: 3.5 Effacement (%): 50 Station: -3 Presentation: Vertex Exam by:: Herb Grays, RN,   Also reviewed contraction pattern and that non-stress test is reactive.  It has been documented that patient is verbalizing regular contractions, every 2-3 minutes and that contractions are not tracing well due to frequent maternal repositioning from pain. Patient had no cervical change over 1 hour. Discussed patient's pain level with provider and administered ordered medication prior to discharge. Patient denies any other complaints.  Based on this report provider has given order for discharge.  A discharge order and diagnosis entered by a provider.  Labor discharge instructions reviewed with patient and patient verbalized understanding on when to return to the hospital.

## 2022-01-31 NOTE — H&P (Signed)
Danielle Evans is a 31 y.o. [id number] at 1w4dby early ultrasound who presents to maternity admissions for labor evaluation with worsening contractions and leaking fluid with blood show, requiring a pantyliner but not soaking a pad.  She reports feeling like the baby dropped in her pelvis more today and the contractions have become more regular, with more pelvic and less back pain than during her previous MAU visits.    [place] 01/28/22 with EFW 99%, 4406g. Per MFM :     If she goes into spontaneous labor prior to 39 weeks I think a  vaginal delivery is acceptable however, if she does not  achieve spontaneous labor by 39 weeks then a cesarean  delivery is preferred because at this time I suspect the fetus  could be over 5000 g.  Also discussed that induction of labor  is not indicated for suspected macrosomia before 39 weeks.  After lengthy discussion with patient including discussion of risks of LGA baby including shoulder dystocia, and discussing pt previous delivery and progress, pt desires to proceed with labor admission and vaginal delivery if progress is normal.  Pt aware that if labor does not progress within normal parameters, cesarean delivery will be considered sooner due to LGA.    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   Ovarian cyst    Pap smear abnormality of cervix 2014   Pneumonia    Sleep apnea    childhood, adenoidectomy   UTI (urinary tract infection)    Past Surgical History:   Procedure Laterality Date   DILATION AND CURETTAGE OF UTERUS     LAPAROSCOPIC UNILATERAL SALPINGECTOMY Left 11/21/2015   Procedure: LAPAROSCOPIC UNILATERAL SALPINGECTOMY;  Surgeon: JWoodroe Mode MD;  [facility];  Service: Gynecology;  Laterality: Left;   LAPAROSCOPY N/A 11/21/2015   Procedure: LAPAROSCOPY DIAGNOSTIC;  Surgeon: JWoodroe Mode MD;  [facility];  Service: Gynecology;  Laterality: N/A;   NASAL SINUS SURGERY     TONSILECTOMY, ADENOIDECTOMY, BILATERAL MYRINGOTOMY AND TUBES     Family History: family history includes Cancer in her maternal grandmother and paternal aunt; Healthy in her father; Heart disease in her paternal grandfather; Parkinsonism in her maternal grandfather; Sarcoidosis in her mother. Social History:  reports that she quit smoking about 10 years ago. Her smoking use included cigarettes. She has never used smokeless tobacco. She reports that she does not drink alcohol and does not use drugs.     Maternal Diabetes: No Genetic Screening: Normal Maternal Ultrasounds/Referrals: Normal Fetal Ultrasounds or other Referrals:  None Maternal Substance Abuse:  No Significant Maternal Medications:  None Significant Maternal Lab Results:  Group  B Strep negative Number of Prenatal Visits:greater than 3 verified prenatal visits Other Comments:  None  Review of Systems  Constitutional:  Negative for chills, fatigue and fever.  Eyes:  Negative for visual disturbance.  Respiratory:  Negative for shortness of breath.   Cardiovascular:  Negative for chest pain.  Gastrointestinal:  Positive for abdominal pain. Negative for vomiting.  Genitourinary:  Positive for pelvic pain and vaginal bleeding. Negative for difficulty urinating, dysuria, flank pain, vaginal discharge and vaginal pain.  Musculoskeletal:  Positive for back pain.  Neurological:  Negative for dizziness and headaches.  Psychiatric/Behavioral: Negative.     Maternal Medical History:  Reason for  admission: Contractions.   Contractions: Onset was 2 days ago.   Frequency: regular.   Perceived severity is moderate.   Fetal activity: Perceived fetal activity is normal.   Last perceived fetal movement was within the past hour.   Prenatal complications: LGA 18%, 2993Z on 16/9/67 Prenatal Complications - Diabetes: none.   Dilation: 5 Effacement (%): 80 Station: -2 Exam by:: Fatima Blank, CNM Blood pressure 115/70, pulse 81, temperature 98.2 F (36.8 C), temperature source Oral, resp. rate 16, last menstrual period 04/19/2021, SpO2 99 %. Maternal Exam:  Uterine Assessment: Contraction strength is moderate.  Contraction frequency is regular.  Abdomen: Fetal presentation: vertex Introitus: No HSV lesions noted on SSE  Cervix: Cervix evaluated by digital exam.     Fetal Exam Fetal Monitor Review: Mode: ultrasound.   Baseline rate: 135.  Variability: moderate (6-25 bpm).   Pattern: accelerations present and no decelerations.   Fetal State Assessment: Category I - tracings are normal.   Physical Exam Vitals and nursing note reviewed.  Constitutional:      Appearance: She is well-developed.  Cardiovascular:     Rate and Rhythm: Normal rate.     Heart sounds: Normal heart sounds.  Pulmonary:     Effort: Pulmonary effort is normal.     Breath sounds: Normal breath sounds.  Abdominal:     Palpations: Abdomen is soft.  Musculoskeletal:        General: Normal range of motion.     Cervical back: Normal range of motion.  Skin:    General: Skin is warm and dry.  Neurological:     Mental Status: She is alert and oriented to person, place, and time.  Psychiatric:        Behavior: Behavior normal.        Thought Content: Thought content normal.        Judgment: Judgment normal.     Prenatal labs: ABO, Rh: --/--/PENDING (10/05 1331) Antibody: PENDING (10/05 1331) Rubella: 1.00 (03/28 1319) RPR: Non Reactive (07/27 0958)  HBsAg: Negative (03/28 1319)  HIV: Non  Reactive (07/27 0958)  GBS: Negative/-- (09/21 1041)   Assessment/Plan: [id number]  at 83w4dadmitted for active labor at term LGA, 99%, 4406g on 01/28/22 GBS negative  Admit to L&D Expectant management on admission IV pain medications per pt request May have epidural if desired    LFatima Blank10/08/2021, 2:18 PM

## 2022-01-31 NOTE — MAU Note (Signed)
.  Danielle Evans is a 31 y.o. at 38w4dhere in MAU reporting: regular ctx that she is rating 9/10. Reports bloody show and some constant leaking that she is unsure if her water is broken. She had the same leaking yesterday FERN and Amnisure was negative. Reports some bloody show and good FM.  Cervix 2/50/-3 in MAU yesterday.   LMP: N/A Onset of complaint: On-going Pain score: 9/10 Vitals:   01/31/22 0135 01/31/22 0144  BP: 120/69 125/74  Pulse: 94 93  Resp: 18   Temp: 98.2 F (36.8 C)   SpO2: 99%      FHT:150 Lab orders placed from triage:  MAU labor

## 2022-01-31 NOTE — Progress Notes (Signed)
Danielle Evans is a 31 y.o. [id number] at 16w4dby 7 week ultrasound admitted for active labor  Subjective: Pt breathing with some contractions, reporting exhaustion. Desires IV pain medication at this time.  May want epidural later, but wants to wait at this time.   Objective: BP 126/69   Pulse 77   Temp 98.2 F (36.8 C) (Oral)   Resp 16   LMP 04/19/2021 (Approximate)   SpO2 99%  No intake/output data recorded. No intake/output data recorded.  FHT:  FHR: 135 bpm, variability: moderate,  accelerations:  Present,  decelerations:  Absent UC:   regular, every 5-6 minutes SVE:   Dilation: 5 Effacement (%): 80 Station: -2 Exam by:: LFatima Blank CNM  Labs: Lab Results  Component Value Date   WBC 9.1 01/31/2022   HGB 9.9 (L) 01/31/2022   HCT 32.2 (L) 01/31/2022   MCV 80.3 01/31/2022   PLT 182 01/31/2022    Assessment / Plan: Spontaneous labor, progressing normally  Labor:  Pt desires rest with IV pain medication, will reevaluate in 2 hours, consider augmentation if not progressing.   Preeclampsia:   n/a Fetal Wellbeing:  Category I Pain Control:  IV pain meds I/D:   GBS neg Anticipated MOD:  NSVD  LFatima Blank CNM 01/31/2022, 4:24 PM

## 2022-01-31 NOTE — Progress Notes (Deleted)
  S: Ms. Maxyne Derocher is a 31 y.o. [phone] at [redacted]w[redacted]d who presents to MAU today complaining contractions sporadically since yesterday afternoon. She denies vaginal bleeding. She denies LOF. She reports normal fetal movement.    O: BP 125/74   Pulse 93   Temp 98.2 F (36.8 C) (Oral)   Resp 18   Ht '5\' 7"'$  (1.702 m)   Wt 107.8 kg   LMP 04/19/2021 (Approximate)   SpO2 99%   BMI 37.23 kg/m  GENERAL: Well-developed, well-nourished female in no acute distress.  HEAD: Normocephalic, atraumatic.  CHEST: Normal effort of breathing, regular heart rate ABDOMEN: Soft, nontender, gravid  Cervical exam:  Dilation: 3.5 Effacement (%): 50 Station: -3 Presentation: Vertex Exam by:: WHerb Grays RN   Fetal Monitoring: Baseline: 140 Variability: moderate Accelerations: yes Decelerations: none Contractions: sporadic   A: SIUP at 365w4dFalse labor  P: Patient having moderate to severe pain with contractions. After 1 hr check patient was unchanged from previous exam and NST was reactive. In shared decision making with patient she would prefer to labor down at home with some relief from pain. Allergic to morphine so nubain was administered inpatient and strict return precautions were given. She will come back for contractions increasing in frequency, LOF, headache that is refractory to intervention, or if she does not enter spontaneous labor prior to 39+1.    LoDeloria LairDO 01/31/2022 4:04 AM

## 2022-01-31 NOTE — Lactation Note (Signed)
This note was copied from a baby's chart. Lactation Consultation Note  Patient Name: Danielle Evans Date: 01/31/2022 Reason for consult: L&D Initial assessment Age:31 hours Mom and RN had gotten baby latched when [place] came into rm. Mom holding breast tissue in baby's mouth. Baby slightly tongue thrusting as suckles. Mom stated her 1st child done the same thing. Noted recessed chin. Demonstrated chin tug when placing nipple in baby's mouth. Baby had BF for 15 minutes on that one side so moved baby to opposite breast d/t he kept crying and had stopped BF. Baby latched but not suckling just holding nipple in mouth. If nipple pops out baby cries. Baby burying face into breast. Reposition baby's face, get mom to hold breast tissue so it doesn't cover face. Discussed feeding position options for newborns. Will f/u on MBU.  Maternal Data Has patient been taught Hand Expression?: Yes Does the patient have breastfeeding experience prior to this delivery?: Yes How long did the patient breastfeed?: 10 months to her now 31 yr old  Feeding    LATCH Score Latch: Grasps breast easily, tongue down, lips flanged, rhythmical sucking.  Audible Swallowing: None  Type of Nipple: Everted at rest and after stimulation  Comfort (Breast/Nipple): Soft / non-tender  Hold (Positioning): Assistance needed to correctly position infant at breast and maintain latch.  LATCH Score: 7   Lactation Tools Discussed/Used    Interventions Interventions: Adjust position;Breast feeding basics reviewed;Assisted with latch;Support pillows;Skin to skin;Hand express;Breast compression  Discharge    Consult Status Consult Status: Follow-up from L&D Date: 02/01/22 Follow-up type: In-patient    Theodoro Kalata 01/31/2022, 9:32 PM

## 2022-01-31 NOTE — Discharge Summary (Signed)
Postpartum Discharge Summary     Patient Name: Danielle Evans DOB: [birth year] MRN: [id number]  Date of admission: 01/31/2022 Delivery date:01/31/2022  Delivering provider: Fatima Blank A  Date of discharge: 02/02/2022  Admitting diagnosis: Normal labor [O80, Z37.9] Intrauterine pregnancy: [redacted]w[redacted]d    Secondary diagnosis:  Principal Problem:   NSVD (normal spontaneous vaginal delivery)  Additional problems: N/A    Discharge diagnosis: Term Pregnancy Delivered                                              Post partum procedures: None Augmentation: N/A Complications: None  Hospital course: Onset of Labor With Vaginal Delivery      31y.o. yo [id number] 31w4das admitted in Active Labor on 01/31/2022. Labor course was uncomplicated.  Membrane Rupture Time/Date: 5:00 PM ,01/31/2022   Delivery Method:Vaginal, Spontaneous  Episiotomy: None  Lacerations:    Patient had an uncomplicated postpartum course.  She is ambulating, tolerating a regular diet, passing flatus, and urinating well. Patient is discharged home in stable condition on 02/02/22.  Newborn Data: Birth date:01/31/2022  Birth time:7:56 PM  Gender:Female  Living status:Living  Apgars:8 ,9  Weight:4281 g   Magnesium Sulfate received: No BMZ received: No Rhophylac:No MMR:No T-DaP:Given prenatally Flu: No Transfusion:No  Physical exam  Vitals:   01/31/22 1901 01/31/22 1931 01/31/22 2001 01/31/22 2016  BP: 115/66 121/67 114/60 119/74  Pulse: 62 65 73 83  Resp:      Temp:      TempSrc:      SpO2:       General: alert, cooperative, and no distress Lochia: appropriate Uterine Fundus: firm Incision: N/A DVT Evaluation: No evidence of DVT seen on physical exam. Labs: Lab Results  Component Value Date   WBC 9.1 01/31/2022   HGB 9.9 (L) 01/31/2022   HCT 32.2 (L) 01/31/2022   MCV 80.3 01/31/2022   PLT 182 01/31/2022      Latest Ref Rng & Units 12/20/2021    8:05 PM  CMP  Glucose 70 - 99 mg/dL 80    BUN 6 - 20 mg/dL 5   Creatinine 0.44 - 1.00 mg/dL 0.70   Sodium 135 - 145 mmol/L 138   Potassium 3.5 - 5.1 mmol/L 3.9   Chloride 98 - 111 mmol/L 109   CO2 22 - 32 mmol/L 22   Calcium 8.9 - 10.3 mg/dL 9.3   Total Protein 6.5 - 8.1 g/dL 5.6   Total Bilirubin 0.3 - 1.2 mg/dL 0.5   Alkaline Phos 38 - 126 U/L 91   AST 15 - 41 U/L 16   ALT 0 - 44 U/L 11    Edinburgh Score:     No data to display           After visit meds:  Allergies as of 02/02/2022       Reactions   Ceclor [cefaclor] Hives   But has tolerated other cephalosporins, PCN, Augmetin   Morphine And Related Itching   Promethazine Nausea And Vomiting        Medication List     STOP taking these medications    aspirin EC 81 MG tablet   hydrocortisone 25 MG suppository Commonly known as: ANUSOL-HC   omeprazole 20 MG capsule Commonly known as: PRILOSEC   ondansetron 4 MG tablet Commonly known as: Zofran   zolpidem 10  MG tablet Commonly known as: AMBIEN       TAKE these medications    acetaminophen 325 MG tablet Commonly known as: Tylenol Take 2 tablets (650 mg total) by mouth every 4 (four) hours as needed (for pain scale < 4).   calcium carbonate 500 MG chewable tablet Commonly known as: TUMS - dosed in mg elemental calcium Chew 1 tablet by mouth daily.   docusate sodium 100 MG capsule Commonly known as: Colace Take 1 capsule (100 mg total) by mouth 2 (two) times daily.   famotidine 20 MG tablet Commonly known as: PEPCID Take 1 tablet (20 mg total) by mouth daily as needed for up to 30 doses for heartburn. What changed:  medication strength how much to take when to take this reasons to take this   gabapentin 100 MG capsule Commonly known as: NEURONTIN Take 1 capsule (100 mg total) by mouth at bedtime.   hydrocortisone 2.5 % rectal cream Commonly known as: ANUSOL-HC Place rectally 2 (two) times daily.   ibuprofen 600 MG tablet Commonly known as: ADVIL Take 1 tablet (600 mg  total) by mouth every 6 (six) hours.   iron polysaccharides 150 MG capsule Commonly known as: NIFEREX Take 1 capsule (150 mg total) by mouth daily.   prenatal multivitamin Tabs tablet Take 1 tablet by mouth daily at 12 noon.   valACYclovir 500 MG tablet Commonly known as: VALTREX Take 1 tablet (500 mg total) by mouth 2 (two) times daily.         Discharge home in stable condition Infant Feeding: Breast Infant Disposition:home with mother Discharge instruction: per After Visit Summary and Postpartum booklet. Activity: Advance as tolerated. Pelvic rest for 6 weeks.  Diet: routine diet Future Appointments: Future Appointments  Date Time Provider [place]  03/14/2022  3:50 PM Darlina Rumpf, CNM [facility]   Follow up Visit:  Message sent to [place] on 01/31/22:  Please schedule this patient for a In person postpartum visit in 6 weeks with the following provider:   Maryelizabeth Kaufmann, CNM, preferred, or any provider as needed . Additional Postpartum F/U: n/a   Low risk pregnancy complicated by:  LGA Delivery mode:  Vaginal, Spontaneous  Anticipated Birth Control:  IUD   Mallie Snooks, [place], MSN, CNM Certified Nurse Midwife, Product/process development scientist for [employer], [place]

## 2022-01-31 NOTE — Anesthesia Preprocedure Evaluation (Signed)
Anesthesia Evaluation  Patient identified by MRN, date of birth, ID band Patient awake    Reviewed: Allergy & Precautions, H&P , NPO status , Patient's Chart, lab work & pertinent test results  History of Anesthesia Complications Negative for: history of anesthetic complications  Airway Mallampati: II  TM Distance: >3 FB Neck ROM: full    Dental no notable dental hx. (+) Teeth Intact   Pulmonary sleep apnea , former smoker,    Pulmonary exam normal breath sounds clear to auscultation       Cardiovascular negative cardio ROS Normal cardiovascular exam Rhythm:regular Rate:Normal     Neuro/Psych  Headaches, PSYCHIATRIC DISORDERS Anxiety Depression    GI/Hepatic Neg liver ROS, GERD  Medicated,  Endo/Other  negative endocrine ROS  Renal/GU negative Renal ROS  negative genitourinary   Musculoskeletal   Abdominal   Peds  Hematology  (+) Blood dyscrasia, anemia ,   Anesthesia Other Findings   Reproductive/Obstetrics (+) Pregnancy                             Anesthesia Physical Anesthesia Plan  ASA: 2  Anesthesia Plan: Epidural   Post-op Pain Management:    Induction:   PONV Risk Score and Plan:   Airway Management Planned:   Additional Equipment:   Intra-op Plan:   Post-operative Plan:   Informed Consent: I have reviewed the patients History and Physical, chart, labs and discussed the procedure including the risks, benefits and alternatives for the proposed anesthesia with the patient or authorized representative who has indicated his/her understanding and acceptance.       Plan Discussed with:   Anesthesia Plan Comments:         Anesthesia Quick Evaluation

## 2022-02-01 LAB — RPR: RPR Ser Ql: NONREACTIVE

## 2022-02-01 MED ORDER — OXYCODONE HCL 5 MG PO TABS
5.0000 mg | ORAL_TABLET | ORAL | Status: DC | PRN
  Administered 2022-02-01 – 2022-02-02 (×7): 5 mg via ORAL
  Filled 2022-02-01 (×7): qty 1

## 2022-02-01 MED ORDER — FAMOTIDINE 20 MG PO TABS
20.0000 mg | ORAL_TABLET | Freq: Every day | ORAL | Status: DC | PRN
  Administered 2022-02-01: 20 mg via ORAL
  Filled 2022-02-01: qty 1

## 2022-02-01 MED ORDER — INFLUENZA VAC SPLIT QUAD 0.5 ML IM SUSY
0.5000 mL | PREFILLED_SYRINGE | INTRAMUSCULAR | Status: AC
  Administered 2022-02-02: 0.5 mL via INTRAMUSCULAR
  Filled 2022-02-01: qty 0.5

## 2022-02-01 NOTE — Anesthesia Postprocedure Evaluation (Signed)
Anesthesia Post Note  Patient: Danielle Evans  Procedure(s) Performed: AN AD HOC LABOR EPIDURAL     Patient location during evaluation: Mother Baby Anesthesia Type: Epidural Level of consciousness: awake and alert Pain management: pain level controlled Vital Signs Assessment: post-procedure vital signs reviewed and stable Respiratory status: spontaneous breathing, nonlabored ventilation and respiratory function stable Cardiovascular status: stable Postop Assessment: no headache, no backache, epidural receding, no apparent nausea or vomiting, patient able to bend at knees, adequate PO intake and able to ambulate Anesthetic complications: no   No notable events documented.  Last Vitals:  Vitals:   02/01/22 0340 02/01/22 0835  BP: 124/72 122/74  Pulse: 74 64  Resp: 17 17  Temp: (!) 36.3 C (!) 36.3 C  SpO2: 100% 100%    Last Pain:  Vitals:   02/01/22 0835  TempSrc: Oral  PainSc:    Pain Goal: Patients Stated Pain Goal: 2 (01/31/22 1550)                 Jabier Mutton

## 2022-02-01 NOTE — Social Work (Signed)
MOB was referred for history of depression/anxiety.  * Referral screened out by Clinical Social Worker because none of the following criteria appear to apply: ~ History of anxiety/depression during this pregnancy, or of post-partum depression following prior delivery. ~ Diagnosis of anxiety and/or depression within last 3 years OR  MOB's symptoms currently being treated with medication and/or therapy. Per chart review, MOB has an active prescription for Hydroxyzine to treat anxiety. She sees Catering manager for anxiety and depression. She has a two week PP follow up.   Please contact the Clinical Social Worker if needs arise, by [facility] request, or if MOB scores greater than 9/yes to question 10 on Edinburgh Postpartum Depression Screen.    Kathrin Greathouse, MSW, LCSW [facility] Worker  [phone] 02/01/2022  9:23 AM

## 2022-02-01 NOTE — Progress Notes (Signed)
Post Partum Day 1 Subjective: no complaints, up ad lib, voiding and tolerating PO, small lochia, plans to breastfeed, IUD  Objective: Blood pressure 124/72, pulse 74, temperature (!) 97.4 F (36.3 C), temperature source Oral, resp. rate 17, last menstrual period 04/19/2021, SpO2 100 %, unknown if currently breastfeeding.  Physical Exam:  General: alert, cooperative and no distress Lochia:normal flow Chest: CTAB Heart: RRR no m/r/g Abdomen: +BS, soft, nontender,  Uterine Fundus: firm DVT Evaluation: No evidence of DVT seen on physical exam. Extremities: trace edema  Recent Labs    01/31/22 1331  HGB 9.9*  HCT 32.2*    Assessment/Plan: Plan for discharge tomorrow, Breastfeeding, and Lactation consult   LOS: 1 day   Christin Fudge 02/01/2022, 8:24 AM

## 2022-02-01 NOTE — Lactation Note (Signed)
This note was copied from a baby's chart. Lactation Consultation Note  Patient Name: [name] [id number] Date: 02/01/2022 Reason for consult: Follow-up assessment;Early term 37-38.6wks Age:31 hours Mom stated BF is going great. She is a little sore on the Rt. Nipple but the Lt. Nipple is good. Mom stated she thinks the baby BF a little shallow once on the Rt. Nipple and that is why it is sore. Newborn feeding habits, STS, I&O, reviewed. Mom encouraged to feed baby 8-12 times/24 hours and with feeding cues.   Mom doesn't have any questions or concerns at this time. Praised mom for a good latching. Encouraged to call for assistance if needed.  Maternal Data    Feeding    LATCH Score                    Lactation Tools Discussed/Used    Interventions    Discharge    Consult Status Consult Status: Follow-up Date: 02/02/22 Follow-up type: In-patient    Theodoro Kalata 02/01/2022, 8:39 PM

## 2022-02-01 NOTE — MAU Provider Note (Signed)
   S: [name] is a 31 y.o. [phone] at [redacted]w[redacted]d who presents to MAU today complaining contractions sporadically since yesterday afternoon. She denies vaginal bleeding. She denies LOF. She reports normal fetal movement.    O: BP 125/74   Pulse 93   Temp 98.2 F (36.8 C) (Oral)   Resp 18   Ht '5\' 7"'$  (1.702 m)   Wt 107.8 kg   LMP 04/19/2021 (Approximate)   SpO2 99%   BMI 37.23 kg/m  GENERAL: Well-developed, well-nourished female in no acute distress.  HEAD: Normocephalic, atraumatic.  CHEST: Normal effort of breathing, regular heart rate ABDOMEN: Soft, nontender, gravid  Cervical exam:  Dilation: 3.5 Effacement (%): 50 Station: -3 Presentation: Vertex Exam by:: WHerb Grays RN   Fetal Monitoring: Baseline: 140 Variability: moderate Accelerations: yes Decelerations: none Contractions: sporadic   A: SIUP at 366w4dFalse labor  P: Patient having moderate to severe pain with contractions. After 1 hr check patient was unchanged from previous exam and NST was reactive. In shared decision making with patient she would prefer to continue latent labor at home. Allergic to morphine so nubain was administered inpatient and strict return precautions were given. She will come back for contractions increasing in frequency, LOF, headache that is refractory to intervention, or if she does not enter spontaneous labor prior to 39+1.    LoDeloria LairDO 01/31/2022 4:04 AM   Fellow Attestation  I evaluated the patient, performing the key elements of the service and medical decision making activities of this service and have verified that the service and findings are accurately documented in the resident's note. I developed the management plan that is described in the resident's note, and I agree with the content, with my edits above.   ChStormy CardMD,[facility] OB Fellow, Faculty Practice  02/01/2022 8:52 PM

## 2022-02-02 MED ORDER — ACETAMINOPHEN 325 MG PO TABS: 650.0000 mg | ORAL_TABLET | ORAL | 0 refills | Status: AC | PRN

## 2022-02-02 MED ORDER — FAMOTIDINE 20 MG PO TABS: 20.0000 mg | ORAL_TABLET | Freq: Every day | ORAL | 0 refills | Status: DC | PRN

## 2022-02-02 MED ORDER — IBUPROFEN 600 MG PO TABS: 600.0000 mg | ORAL_TABLET | Freq: Four times a day (QID) | ORAL | 0 refills | Status: DC
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
## 2022-02-02 NOTE — Discharge Instructions (Signed)

## 2022-02-03 ENCOUNTER — Ambulatory Visit: Payer: Self-pay

## 2022-02-03 ENCOUNTER — Encounter: Payer: Self-pay | Admitting: Advanced Practice Midwife

## 2022-02-03 NOTE — Lactation Note (Signed)
This note was copied from a baby's chart. Lactation Consultation Note  Patient Name: Danielle Evans MBOMQ'T Date: 02/03/2022 Reason for consult: Follow-up assessment;Early term 37-38.6wks;Infant weight loss (7 % weight loss, attempted to see dyad and birth parent asleep.) Age:31 hours  Maternal Data    Feeding Mother's Current Feeding Choice: Breast Milk and Formula   Consult Status Consult Status: Follow-up Date: 02/03/22 Follow-up type: In-patient    [place] 02/03/2022, 10:13 AM

## 2022-02-03 NOTE — Lactation Note (Signed)
This note was copied from a baby's chart. Lactation Consultation Note  Patient Name: Boy Lelia Jons JOITG'P Date: 02/03/2022 - Baby 65 hours old.  Reason for consult: Early term 20-38.6wks;Initial assessment;Infant weight loss (7 % weighgt loss. As LC entered the room, mom was feeding the baby formula and then dad took over feeding. Per mom due to the baby having a recessed chin the latch is more difficult) LC reviewed BF D/C teaching and [place] resources. Per mom has a DEBP Spectra at home and has her own [employer] she will be working with after D/C.     Feeding Mother's Current Feeding Choice: Breast Milk and Formula Nipple Type: Slow - flow  LATCH Score - LC unable to see a latch due to baby receiving a bottle for feeding.    Lactation Tools Discussed/Used  DEBP - mom has been pumping and milk is in bilaterally. Several bottle of EBM in the refrigerator.  Mom aware to take the milk home and has bag if ice to place her bottles.    Consult Status Consult Status: Complete Date: 02/03/22 Follow-up type: In-patient    [place] 02/03/2022, 1:06 PM

## 2022-02-06 ENCOUNTER — Encounter (HOSPITAL_COMMUNITY): Payer: Medicaid Other

## 2022-02-07 ENCOUNTER — Encounter: Payer: Medicaid Other | Admitting: Obstetrics & Gynecology

## 2022-02-07 ENCOUNTER — Telehealth (HOSPITAL_COMMUNITY): Payer: Self-pay

## 2022-02-07 NOTE — Telephone Encounter (Signed)
Patient did not answer phone call. Voicemail left for patient.   Patient's EPDS score while she was inpatient on 02/02/22 was 12. Per social work note patient is established with a psychiatrist and a therapist and plans to follow up with them. Patient also has a postpartum appointment scheduled with her OB.  Sharyn Lull [facility] 02/07/2022,1732

## 2022-02-14 ENCOUNTER — Encounter: Payer: Medicaid Other | Admitting: Advanced Practice Midwife

## 2022-03-14 ENCOUNTER — Ambulatory Visit (INDEPENDENT_AMBULATORY_CARE_PROVIDER_SITE_OTHER): Payer: Medicaid Other | Admitting: Advanced Practice Midwife

## 2022-03-14 ENCOUNTER — Encounter: Payer: Self-pay | Admitting: Advanced Practice Midwife

## 2022-03-14 ENCOUNTER — Other Ambulatory Visit (HOSPITAL_COMMUNITY)
Admission: RE | Admit: 2022-03-14 | Discharge: 2022-03-14 | Disposition: A | Discharge status: Home / Self Care | Payer: Medicaid Other | Source: Ambulatory Visit | Attending: Advanced Practice Midwife | Admitting: Advanced Practice Midwife

## 2022-03-14 DIAGNOSIS — Z30430 Encounter for insertion of intrauterine contraceptive device: Secondary | ICD-10-CM

## 2022-03-14 DIAGNOSIS — Z30014 Encounter for initial prescription of intrauterine contraceptive device: Secondary | ICD-10-CM

## 2022-03-14 LAB — POCT URINE PREGNANCY: Preg Test, Ur: NEGATIVE

## 2022-03-14 MED ORDER — LEVONORGESTREL 20 MCG/DAY IU IUD
1.0000 | INTRAUTERINE_SYSTEM | Freq: Once | INTRAUTERINE | Status: AC
  Administered 2022-03-14: 1 via INTRAUTERINE

## 2022-03-14 NOTE — Progress Notes (Signed)
     [place] Partum Visit Note  Cambri Rennie is a 31 y.o. [id number] female who presents for a postpartum visit. She is 6 week postpartum following a normal spontaneous vaginal delivery.  I have fully reviewed the prenatal and intrapartum course. The delivery was at 9w4dgestational weeks.  Anesthesia: epidural. Postpartum course has been "mentally exhausting." Baby is doing well. Baby is feeding by bottle - Similac 360 switching to Similac Total Comfort . Bleeding no bleeding. Bowel function reports constipation . Bladder function is normal. Patient is sexually active. She used the withdrawal method yesterday. Contraception method is none. Postpartum depression screening: positive EPDS= 16 - Declines counseling services. Has a therapist already per pt.    The pregnancy intention screening data noted above was reviewed. Potential methods of contraception were discussed. The patient elected to proceed with Mirena IUD.    Health Maintenance Due  Topic Date Due   COVID-19 Vaccine (1) Never done   PAP SMEAR-Modifier  10/16/2018    The following portions of the patient's history were reviewed and updated as appropriate: allergies, current medications, past family history, past medical history, past social history, past surgical history, and problem list.  Review of Systems Comprehensive review of systems was negative except for postpartum anxiety and depression  Objective:  LMP 04/19/2021 (Approximate)    General:  alert, cooperative, appears stated age, and no distress   Breasts:  normal  Lungs: clear to auscultation bilaterally  Heart:  normal apical impulse  Abdomen: soft, non-tender; bowel sounds normal; no masses,  no organomegaly   GU exam:  normal       Assessment:   Normal postpartum exam Mirena IUD insertion (see separate procedure note) Postpartum anxiety and depression. Extend maternity leave to 8 weeks total   Plan:   Essential components of care per ACOG  recommendations:  1.  Mood and well being: Patient with positive depression screening today. Reviewed local resources for support.  - Patient tobacco use? No.   - hx of drug use? No.    2. Infant care and feeding:  -Patient currently breastmilk feeding? No.   3. Sexuality, contraception and birth spacing - Patient does not want a pregnancy in the next year.   - Reviewed reproductive life planning. Reviewed contraceptive methods based on pt preferences and effectiveness.  Patient desired IUD or IUS today.   - Discussed birth spacing of 18 months  4. Sleep and fatigue -Encouraged family/partner/community support of 4 hrs of uninterrupted sleep to help with mood and fatigue  5. Physical Recovery  - Discussed patients delivery and complications. She describes her labor as good. - Patient had a Vaginal, no problems at delivery. Patient had a superficial left labial repair, repair not indicated. Perineal healing reviewed. Patient expressed understanding - Patient has urinary incontinence? No. - Patient is safe to resume physical and sexual activity  6.  Health Maintenance - HM due items addressed Yes - Last pap smear 08/23/2015 Pap smear done at today's visit.  -Breast Cancer screening indicated? No.   7. Chronic Disease/Pregnancy Condition follow up: None  - PCP follow up  SMallie Snooks MSumas MSN, CNM Certified Nurse Midwife, F[address] [facility] [employer] [place]

## 2022-03-15 ENCOUNTER — Encounter: Payer: Self-pay | Admitting: Advanced Practice Midwife

## 2022-03-16 DIAGNOSIS — Z30430 Encounter for insertion of intrauterine contraceptive device: Secondary | ICD-10-CM | POA: Insufficient documentation

## 2022-03-16 NOTE — Procedures (Signed)
    GYNECOLOGY OFFICE PROCEDURE NOTE  Danielle Evans is a 31 y.o. [id number] here for IUD insertion in conjunction with her postpartum visit. No GYN concerns.  Last pap smear was on 09/2015 and was normal.  IUD Insertion Procedure Note Patient identified, informed consent performed, consent signed.   Discussed risks of irregular bleeding, cramping, infection, malpositioning or misplacement of the IUD outside the uterus which may require further procedure such as laparoscopy. Also discussed >99% contraception efficacy, increased risk of ectopic pregnancy with failure of method.   Emphasized that this did not protect against STIs, condoms recommended during all sexual encounters. Time out was performed.  Urine pregnancy test negative.  Speculum placed in the vagina.  Cervix visualized.  Cleaned with Betadine x 2.  Grasped anteriorly with a single tooth tenaculum.  Uterus sounded to 7 cm.  Mirena IUD placed per manufacturer's recommendations.  Strings trimmed to 3 cm. Tenaculum was removed, good hemostasis noted following gentle pressure with fox swabs.  Patient tolerated procedure well.   Patient was given post-procedure instructions.  She was advised to have backup contraception for one week.  Patient was also asked to check IUD strings periodically and follow up in 4 weeks for IUD check.   Mallie Snooks, [place], MSN, CNM Certified Nurse Midwife, Product/process development scientist for [employer], [place]

## 2022-03-20 LAB — CYTOLOGY - PAP
Comment: NEGATIVE
Diagnosis: NEGATIVE
Diagnosis: REACTIVE
High risk HPV: NEGATIVE

## 2022-03-27 ENCOUNTER — Encounter: Payer: Self-pay | Admitting: Advanced Practice Midwife

## 2022-04-02 ENCOUNTER — Encounter: Payer: Self-pay | Admitting: *Deleted

## 2022-04-05 ENCOUNTER — Encounter: Payer: Self-pay | Admitting: Advanced Practice Midwife

## 2022-04-09 ENCOUNTER — Other Ambulatory Visit: Payer: Self-pay | Admitting: *Deleted

## 2022-04-09 DIAGNOSIS — M5430 Sciatica, unspecified side: Secondary | ICD-10-CM

## 2022-04-09 MED ORDER — NORGESTIMATE-ETH ESTRADIOL 0.25-35 MG-MCG PO TABS: 1.0000 | ORAL_TABLET | Freq: Every day | ORAL | 0 refills | Status: AC

## 2022-04-16 ENCOUNTER — Other Ambulatory Visit: Payer: Self-pay | Admitting: *Deleted

## 2022-04-16 MED ORDER — FAMOTIDINE 20 MG PO TABS: 20.0000 mg | ORAL_TABLET | Freq: Every day | ORAL | 0 refills | Status: DC | PRN

## 2022-04-25 ENCOUNTER — Encounter: Payer: Self-pay | Admitting: Advanced Practice Midwife

## 2022-04-26 ENCOUNTER — Other Ambulatory Visit: Payer: Self-pay | Admitting: *Deleted

## 2022-04-26 MED ORDER — ONDANSETRON 4 MG PO TBDP: 4.0000 mg | ORAL_TABLET | Freq: Four times a day (QID) | ORAL | 5 refills | Status: AC | PRN

## 2022-05-03 ENCOUNTER — Other Ambulatory Visit: Payer: Self-pay | Admitting: *Deleted

## 2022-05-03 DIAGNOSIS — N921 Excessive and frequent menstruation with irregular cycle: Secondary | ICD-10-CM

## 2022-05-20 ENCOUNTER — Encounter: Payer: Self-pay | Admitting: Advanced Practice Midwife

## 2022-05-20 ENCOUNTER — Other Ambulatory Visit: Payer: Medicaid Other

## 2022-05-29 ENCOUNTER — Encounter (HOSPITAL_COMMUNITY): Payer: Self-pay

## 2022-05-29 ENCOUNTER — Emergency Department (HOSPITAL_COMMUNITY): Payer: Medicaid Other

## 2022-05-29 ENCOUNTER — Emergency Department (HOSPITAL_COMMUNITY)
Admission: EM | Admit: 2022-05-29 | Discharge: 2022-05-29 | Disposition: A | Discharge status: Home / Self Care | Payer: Medicaid Other | Attending: Emergency Medicine | Admitting: Emergency Medicine

## 2022-05-29 DIAGNOSIS — R1010 Upper abdominal pain, unspecified: Secondary | ICD-10-CM | POA: Diagnosis present

## 2022-05-29 DIAGNOSIS — R1011 Right upper quadrant pain: Secondary | ICD-10-CM | POA: Diagnosis not present

## 2022-05-29 DIAGNOSIS — R1031 Right lower quadrant pain: Secondary | ICD-10-CM | POA: Insufficient documentation

## 2022-05-29 DIAGNOSIS — R109 Unspecified abdominal pain: Secondary | ICD-10-CM

## 2022-05-29 LAB — COMPREHENSIVE METABOLIC PANEL
ALT: 20 U/L (ref 0–44)
AST: 21 U/L (ref 15–41)
Albumin: 5 g/dL (ref 3.5–5.0)
Alkaline Phosphatase: 51 U/L (ref 38–126)
Anion gap: 10 (ref 5–15)
BUN: 15 mg/dL (ref 6–20)
CO2: 26 mmol/L (ref 22–32)
Calcium: 9.5 mg/dL (ref 8.9–10.3)
Chloride: 104 mmol/L (ref 98–111)
Creatinine, Ser: 1.11 mg/dL — ABNORMAL HIGH (ref 0.44–1.00)
GFR, Estimated: >60 mL/min (ref >60)
Glucose, Bld: 95 mg/dL (ref 70–99)
Potassium: 3.7 mmol/L (ref 3.5–5.1)
Sodium: 140 mmol/L (ref 135–145)
Total Bilirubin: 0.4 mg/dL (ref 0.3–1.2)
Total Protein: 8.2 g/dL — ABNORMAL HIGH (ref 6.5–8.1)

## 2022-05-29 LAB — CBC WITH DIFFERENTIAL/PLATELET
Abs Immature Granulocytes: 0.01 10*3/uL (ref 0.00–0.07)
Basophils Absolute: 0 10*3/uL (ref 0.0–0.1)
Basophils Relative: 0 %
Eosinophils Absolute: 0.2 10*3/uL (ref 0.0–0.5)
Eosinophils Relative: 3 %
HCT: 38.3 % (ref 36.0–46.0)
Hemoglobin: 11.7 g/dL — ABNORMAL LOW (ref 12.0–15.0)
Immature Granulocytes: 0 %
Lymphocytes Relative: 41 %
Lymphs Abs: 2.5 10*3/uL (ref 0.7–4.0)
MCH: 24.7 pg — ABNORMAL LOW (ref 26.0–34.0)
MCHC: 30.5 g/dL (ref 30.0–36.0)
MCV: 81 fL (ref 80.0–100.0)
Monocytes Absolute: 0.3 10*3/uL (ref 0.1–1.0)
Monocytes Relative: 5 %
Neutro Abs: 3.1 10*3/uL (ref 1.7–7.7)
Neutrophils Relative %: 51 %
Platelets: 314 10*3/uL (ref 150–400)
RBC: 4.73 MIL/uL (ref 3.87–5.11)
RDW: 13.9 % (ref 11.5–15.5)
WBC: 6.1 10*3/uL (ref 4.0–10.5)
nRBC: 0 % (ref 0.0–0.2)

## 2022-05-29 LAB — URINALYSIS, W/ REFLEX TO CULTURE (INFECTION SUSPECTED)
Bilirubin Urine: NEGATIVE
Glucose, UA: NEGATIVE mg/dL
Hgb urine dipstick: NEGATIVE
Ketones, ur: NEGATIVE mg/dL
Leukocytes,Ua: NEGATIVE
Nitrite: NEGATIVE
Protein, ur: NEGATIVE mg/dL
Specific Gravity, Urine: 1.014 (ref 1.005–1.030)
pH: 8 (ref 5.0–8.0)

## 2022-05-29 LAB — PREGNANCY, URINE: Preg Test, Ur: NEGATIVE

## 2022-05-29 LAB — LIPASE, BLOOD: Lipase: 52 U/L — ABNORMAL HIGH (ref 11–51)

## 2022-05-29 MED ORDER — HYDROMORPHONE HCL 1 MG/ML IJ SOLN
1.0000 mg | Freq: Once | INTRAMUSCULAR | Status: AC
  Administered 2022-05-29: 1 mg via INTRAVENOUS
  Filled 2022-05-29: qty 1

## 2022-05-29 MED ORDER — DICYCLOMINE HCL 20 MG PO TABS: 20.0000 mg | ORAL_TABLET | Freq: Three times a day (TID) | ORAL | 0 refills | Status: DC

## 2022-05-29 MED ORDER — IOHEXOL 300 MG/ML  SOLN
100.0000 mL | Freq: Once | INTRAMUSCULAR | Status: AC | PRN
  Administered 2022-05-29: 100 mL via INTRAVENOUS

## 2022-05-29 MED ORDER — NAPROXEN 375 MG PO TABS: 375.0000 mg | ORAL_TABLET | Freq: Two times a day (BID) | ORAL | 0 refills | Status: AC

## 2022-05-29 MED ORDER — ONDANSETRON HCL 4 MG/2ML IJ SOLN
4.0000 mg | Freq: Once | INTRAMUSCULAR | Status: AC
  Administered 2022-05-29: 4 mg via INTRAVENOUS
  Filled 2022-05-29: qty 2

## 2022-05-29 MED ORDER — LACTATED RINGERS IV BOLUS
1000.0000 mL | Freq: Once | INTRAVENOUS | Status: AC
  Administered 2022-05-29: 1000 mL via INTRAVENOUS

## 2022-05-29 MED ORDER — KETOROLAC TROMETHAMINE 15 MG/ML IJ SOLN
15.0000 mg | Freq: Once | INTRAMUSCULAR | Status: AC
  Administered 2022-05-29: 15 mg via INTRAVENOUS
  Filled 2022-05-29: qty 1

## 2022-05-29 NOTE — ED Notes (Signed)
Pt to ct via stretcher

## 2022-05-29 NOTE — ED Notes (Signed)
IV redressed

## 2022-05-29 NOTE — ED Notes (Signed)
Pt ambulatory to bathroom for urine sample.

## 2022-05-29 NOTE — ED Provider Notes (Signed)
[facility] Provider Note   CSN: [id number] Arrival date & time: 05/29/22  1205     History  Chief Complaint  Patient presents with   Abnormal Lab   Flank Pain    Danielle Evans is a 32 y.o. female.  HPI 32 year old female presents with abdominal pain and vomiting.  Overall her symptoms started about 3 weeks ago.  She is overall almost 4 months postpartum.  Developed upper abdominal pain which is now more in her bilateral back.  She has been having vomiting for these 3 weeks.  Anytime she moves, especially standing the pain will become so bad in her abdomen and back that she feels like she is going to pass out.  She went to urgent care couple weeks ago where she had an abnormal lipase, abnormal creatinine, but a normal right upper quadrant ultrasound.  She was told she had a urinary tract infection based on the culture and prescribed Bactrim which she has completed.  However over the last week and a half, after the urgent care visit, she has developed dysuria as well.  No fevers.  She is not short of breath but deep breathing does make the pain in her abdomen worse.  Home Medications Prior to Admission medications   Medication Sig Start Date End Date Taking? Authorizing Provider  dicyclomine (BENTYL) 20 MG tablet Take 1 tablet (20 mg total) by mouth 3 (three) times daily before meals. 05/29/22  Yes Sherwood Gambler, MD  naproxen (NAPROSYN) 375 MG tablet Take 1 tablet (375 mg total) by mouth 2 (two) times daily. 05/29/22  Yes Sherwood Gambler, MD  norgestimate-ethinyl estradiol (ORTHO-CYCLEN) 0.25-35 MG-MCG tablet Take 1 tablet by mouth daily. 04/09/22   Darlina Rumpf, CNM  amphetamine-dextroamphetamine (ADDERALL) 5 MG tablet Take 5 mg by mouth 2 (two) times daily with a meal. 03/12/22   [provider]  calcium carbonate (TUMS - DOSED IN MG ELEMENTAL CALCIUM) 500 MG chewable tablet Chew 1 tablet by mouth daily.    [provider]  famotidine (PEPCID) 20 MG tablet Take 1 tablet (20 mg total) by mouth daily as needed for up to 30 doses for heartburn. 04/16/22   Anyanwu, Sallyanne Havers, MD  gabapentin (NEURONTIN) 100 MG capsule Take 1 capsule (100 mg total) by mouth at bedtime. 09/27/21 08/23/22  Darlina Rumpf, CNM  hydrocortisone (ANUSOL-HC) 2.5 % rectal cream Place rectally 2 (two) times daily. Patient not taking: Reported on 03/14/2022 01/24/22   Osborne Oman, MD  iron polysaccharides (NIFEREX) 150 MG capsule Take 1 capsule (150 mg total) by mouth daily. Patient not taking: Reported on 03/14/2022 01/03/22 05/03/22  Darlina Rumpf, CNM  ondansetron (ZOFRAN-ODT) 4 MG disintegrating tablet Take 1 tablet (4 mg total) by mouth every 6 (six) hours as needed for nausea. 04/26/22   Darlina Rumpf, CNM  Prenatal Vit-Fe Fumarate-FA (PRENATAL MULTIVITAMIN) TABS tablet Take 1 tablet by mouth daily at 12 noon. Patient not taking: Reported on 03/14/2022    [provider]  valACYclovir (VALTREX) 500 MG tablet Take 1 tablet (500 mg total) by mouth 2 (two) times daily. Patient not taking: Reported on 03/14/2022 01/03/22   Darlina Rumpf, CNM  etonogestrel-ethinyl estradiol (NUVARING) 0.12-0.015 MG/24HR vaginal ring Use 1 vaginal ring every 3 weeks without week-free 02/13/12 08/23/15  Delsa Bern, MD  norethindrone-ethinyl estradiol 1/35 ([place] 1/35, 28,) tablet 1 tablet by mouth 3 times daily for 7 days, then begin one table daily thereafter 09/24/11  08/23/15  Haygood, Seymour Bars, MD      Allergies    Ceclor [cefaclor], Morphine and related, and Promethazine    Review of Systems   Review of Systems  Constitutional:  Negative for fever.  Respiratory:  Negative for shortness of breath.   Cardiovascular:  Negative for chest pain.  Gastrointestinal:  Positive for abdominal pain, nausea and vomiting. Negative for diarrhea.  Genitourinary:  Positive for dysuria.  Musculoskeletal:  Positive for  back pain.    Physical Exam Updated Vital Signs BP 128/77   Pulse 70   Temp 98 F (36.7 C) (Oral)   Resp 16   SpO2 100%  Physical Exam Vitals and nursing note reviewed.  Constitutional:      Appearance: She is well-developed.  HENT:     Head: Normocephalic and atraumatic.  Cardiovascular:     Rate and Rhythm: Normal rate and regular rhythm.     Heart sounds: Normal heart sounds.  Pulmonary:     Effort: Pulmonary effort is normal.     Breath sounds: Normal breath sounds.  Abdominal:     Palpations: Abdomen is soft.     Tenderness: There is abdominal tenderness in the right upper quadrant and right lower quadrant. There is no right CVA tenderness or left CVA tenderness.  Skin:    General: Skin is warm and dry.  Neurological:     Mental Status: She is alert.     ED Results / Procedures / Treatments   Labs (all labs ordered are listed, but only abnormal results are displayed) Labs Reviewed  COMPREHENSIVE METABOLIC PANEL - Abnormal; Notable for the following components:      Result Value   Creatinine, Ser 1.11 (*)    Total Protein 8.2 (*)    All other components within normal limits  LIPASE, BLOOD - Abnormal; Notable for the following components:   Lipase 52 (*)    All other components within normal limits  CBC WITH DIFFERENTIAL/PLATELET - Abnormal; Notable for the following components:   Hemoglobin 11.7 (*)    MCH 24.7 (*)    All other components within normal limits  URINALYSIS, W/ REFLEX TO CULTURE (INFECTION SUSPECTED) - Abnormal; Notable for the following components:   Bacteria, UA RARE (*)    All other components within normal limits  PREGNANCY, URINE    EKG None  Radiology CT ABDOMEN PELVIS W CONTRAST  Result Date: 05/29/2022 CLINICAL DATA:  Seen 3 weeks ago for upper abdominal pain and nausea, difficulty keeping anything down or ileus since then. Elevated lipase and icterus kidney function. Now with bilateral flank pain and difficulty urinating. EXAM:  CT ABDOMEN AND PELVIS WITH CONTRAST TECHNIQUE: Multidetector CT imaging of the abdomen and pelvis was performed using the standard protocol following bolus administration of intravenous contrast. RADIATION DOSE REDUCTION: This exam was performed according to the departmental dose-optimization program which includes automated exposure control, adjustment of the mA and/or kV according to patient size and/or use of iterative reconstruction technique. CONTRAST:  168m OMNIPAQUE IOHEXOL 300 MG/ML  SOLN COMPARISON:  Right upper quadrant ultrasound 05/15/2022 CT abdomen/pelvis 09/12/2011 FINDINGS: Lower chest: The lung bases are clear. The imaged heart is unremarkable. Hepatobiliary: The liver and gallbladder are unremarkable. There is no biliary ductal dilatation. Pancreas: Unremarkable. Spleen: Unremarkable. Adrenals/Urinary Tract: The adrenals are unremarkable. The kidneys are unremarkable, with no focal lesion, stone, hydronephrosis, or hydroureter. The bladder is unremarkable. Stomach/Bowel: The stomach is unremarkable. There is no evidence of bowel obstruction. There is no abnormal bowel  wall thickening or inflammatory change. There is a moderate stool burden throughout the colon. The appendix is normal. Vascular/Lymphatic: The abdominal aorta is normal in course and caliber. The major branch vessels are patent. The main portal and splenic veins are patent. There is no abdominal or pelvic lymphadenopathy. Reproductive: An IUD is noted in the uterus. There is no adnexal mass. Other: There is no ascites or free air. Musculoskeletal: There is no acute osseous abnormality or suspicious osseous lesion. IMPRESSION: No acute finding in the abdomen or pelvis. Electronically Signed   By: Valetta Mole M.D.   On: 05/29/2022 15:28    Procedures Procedures    Medications Ordered in ED Medications  ketorolac (TORADOL) 15 MG/ML injection 15 mg (has no administration in time range)  HYDROmorphone (DILAUDID) injection 1 mg  (1 mg Intravenous Given 05/29/22 1305)  ondansetron (ZOFRAN) injection 4 mg (4 mg Intravenous Given 05/29/22 1305)  lactated ringers bolus 1,000 mL (1,000 mLs Intravenous New Bag/Given 05/29/22 1305)  iohexol (OMNIPAQUE) 300 MG/ML solution 100 mL (100 mLs Intravenous Contrast Given 05/29/22 1456)    ED Course/ Medical Decision Making/ A&P                             Medical Decision Making Amount and/or Complexity of Data Reviewed Labs: ordered.    Details: WBC normal.  Lipase 52 which is minimally elevated but not consistent with pancreatitis.  Creatinine of 1.1 is improved from a couple weeks ago though slightly worse than her baseline.  UA unremarkable.  Not pregnant. Radiology: ordered and independent interpretation performed.    Details: CT without acute obstruction, pyelonephritis or ischemia  Risk Prescription drug management.   Unclear was causing her abdominal pain.  Labs are reassuring, UA not consistent with UTI.  CT is unremarkable.  She was given a dose of Dilaudid and Zofran here with some fluids.  She has had symptoms for a prolonged period of time, will try Bentyl, give a dose of Toradol here, and have her follow-up with a PCP.  I doubt a postpartum issue given she is about 4 months postpartum.  Will discharge home with return precautions.        Final Clinical Impression(s) / ED Diagnoses Final diagnoses:  Abdominal pain, unspecified abdominal location    Rx / DC Orders ED Discharge Orders          Ordered    naproxen (NAPROSYN) 375 MG tablet  2 times daily        05/29/22 1536    dicyclomine (BENTYL) 20 MG tablet  3 times daily before meals        05/29/22 1536              Sherwood Gambler, MD 05/29/22 1541

## 2022-05-29 NOTE — ED Triage Notes (Signed)
Pt states that she was seen at [facility] approximately three weeks ago for upper abdominal pain and nausea. Pt states that she's had trouble keeping anything orally down since then. Had labs drawn and [place] of gallbladder done which showed elevated lipase and decreased kidney fxn. Pt states that pain is now bilateral in her flank area and she has trouble with urinating.

## 2022-05-29 NOTE — Discharge Instructions (Signed)
You are being prescribed Bentyl to help with abdominal pain and possible spasms.  You are being prescribed naproxen as an NSAID, do not take other NSAIDs such as ibuprofen, Advil, Aleve, etc.  You can still take Tylenol with this.  If you develop worsening, continued, or recurrent abdominal pain, uncontrolled vomiting, fever, chest or back pain, or any other new/concerning symptoms then return to the ER for evaluation.

## 2022-05-29 NOTE — ED Notes (Signed)
Pt pain is returning. Dr. Regenia Skeeter notified

## 2022-06-08 ENCOUNTER — Encounter (HOSPITAL_COMMUNITY): Payer: Self-pay

## 2022-06-08 ENCOUNTER — Emergency Department (HOSPITAL_COMMUNITY)
Admission: EM | Admit: 2022-06-08 | Discharge: 2022-06-09 | Disposition: A | Discharge status: Home / Self Care | Payer: Medicaid Other | Attending: Emergency Medicine | Admitting: Emergency Medicine

## 2022-06-08 ENCOUNTER — Other Ambulatory Visit: Payer: Self-pay

## 2022-06-08 ENCOUNTER — Emergency Department (HOSPITAL_COMMUNITY): Payer: Medicaid Other

## 2022-06-08 DIAGNOSIS — K2950 Unspecified chronic gastritis without bleeding: Secondary | ICD-10-CM | POA: Insufficient documentation

## 2022-06-08 DIAGNOSIS — Z20822 Contact with and (suspected) exposure to covid-19: Secondary | ICD-10-CM | POA: Insufficient documentation

## 2022-06-08 DIAGNOSIS — K5901 Slow transit constipation: Secondary | ICD-10-CM | POA: Insufficient documentation

## 2022-06-08 DIAGNOSIS — N3090 Cystitis, unspecified without hematuria: Secondary | ICD-10-CM | POA: Insufficient documentation

## 2022-06-08 DIAGNOSIS — R000 Tachycardia, unspecified: Secondary | ICD-10-CM | POA: Insufficient documentation

## 2022-06-08 DIAGNOSIS — N390 Urinary tract infection, site not specified: Secondary | ICD-10-CM

## 2022-06-08 DIAGNOSIS — R1031 Right lower quadrant pain: Secondary | ICD-10-CM | POA: Diagnosis present

## 2022-06-08 LAB — URINALYSIS, ROUTINE W REFLEX MICROSCOPIC
Bacteria, UA: NONE SEEN
Bilirubin Urine: NEGATIVE
Glucose, UA: NEGATIVE mg/dL
Hgb urine dipstick: NEGATIVE
Ketones, ur: NEGATIVE mg/dL
Nitrite: NEGATIVE
Protein, ur: NEGATIVE mg/dL
Specific Gravity, Urine: 1.017 (ref 1.005–1.030)
pH: 5 (ref 5.0–8.0)

## 2022-06-08 LAB — CBC WITH DIFFERENTIAL/PLATELET
Abs Immature Granulocytes: 0.02 10*3/uL (ref 0.00–0.07)
Basophils Absolute: 0 10*3/uL (ref 0.0–0.1)
Basophils Relative: 0 %
Eosinophils Absolute: 0.2 10*3/uL (ref 0.0–0.5)
Eosinophils Relative: 3 %
HCT: 36.4 % (ref 36.0–46.0)
Hemoglobin: 11.3 g/dL — ABNORMAL LOW (ref 12.0–15.0)
Immature Granulocytes: 0 %
Lymphocytes Relative: 32 %
Lymphs Abs: 2.3 10*3/uL (ref 0.7–4.0)
MCH: 25.4 pg — ABNORMAL LOW (ref 26.0–34.0)
MCHC: 31 g/dL (ref 30.0–36.0)
MCV: 81.8 fL (ref 80.0–100.0)
Monocytes Absolute: 0.4 10*3/uL (ref 0.1–1.0)
Monocytes Relative: 6 %
Neutro Abs: 4.2 10*3/uL (ref 1.7–7.7)
Neutrophils Relative %: 59 %
Platelets: 255 10*3/uL (ref 150–400)
RBC: 4.45 MIL/uL (ref 3.87–5.11)
RDW: 13.9 % (ref 11.5–15.5)
WBC: 7.2 10*3/uL (ref 4.0–10.5)
nRBC: 0 % (ref 0.0–0.2)

## 2022-06-08 LAB — COMPREHENSIVE METABOLIC PANEL
ALT: 20 U/L (ref 0–44)
AST: 22 U/L (ref 15–41)
Albumin: 4.2 g/dL (ref 3.5–5.0)
Alkaline Phosphatase: 48 U/L (ref 38–126)
Anion gap: 11 (ref 5–15)
BUN: 13 mg/dL (ref 6–20)
CO2: 24 mmol/L (ref 22–32)
Calcium: 9.2 mg/dL (ref 8.9–10.3)
Chloride: 103 mmol/L (ref 98–111)
Creatinine, Ser: 1.08 mg/dL — ABNORMAL HIGH (ref 0.44–1.00)
GFR, Estimated: >60 mL/min (ref >60)
Glucose, Bld: 89 mg/dL (ref 70–99)
Potassium: 4 mmol/L (ref 3.5–5.1)
Sodium: 138 mmol/L (ref 135–145)
Total Bilirubin: 0.5 mg/dL (ref 0.3–1.2)
Total Protein: 7.1 g/dL (ref 6.5–8.1)

## 2022-06-08 LAB — RESP PANEL BY RT-PCR (RSV, FLU A&B, COVID)  RVPGX2
Influenza A by PCR: NEGATIVE
Influenza B by PCR: NEGATIVE
Resp Syncytial Virus by PCR: NEGATIVE
SARS Coronavirus 2 by RT PCR: NEGATIVE

## 2022-06-08 LAB — PREGNANCY, URINE: Preg Test, Ur: NEGATIVE

## 2022-06-08 LAB — LIPASE, BLOOD: Lipase: 46 U/L (ref 11–51)

## 2022-06-08 MED ORDER — HYDROMORPHONE HCL 1 MG/ML IJ SOLN
0.5000 mg | Freq: Once | INTRAMUSCULAR | Status: AC
  Administered 2022-06-08: 0.5 mg via INTRAVENOUS
  Filled 2022-06-08: qty 1

## 2022-06-08 MED ORDER — ONDANSETRON HCL 4 MG/2ML IJ SOLN
4.0000 mg | Freq: Once | INTRAMUSCULAR | Status: AC
  Administered 2022-06-08: 4 mg via INTRAVENOUS
  Filled 2022-06-08: qty 2

## 2022-06-08 MED ORDER — KETOROLAC TROMETHAMINE 15 MG/ML IJ SOLN
15.0000 mg | Freq: Once | INTRAMUSCULAR | Status: AC
  Administered 2022-06-08: 15 mg via INTRAVENOUS
  Filled 2022-06-08: qty 1

## 2022-06-08 MED ORDER — SODIUM CHLORIDE 0.9 % IV BOLUS
1000.0000 mL | Freq: Once | INTRAVENOUS | Status: AC
  Administered 2022-06-08: 1000 mL via INTRAVENOUS

## 2022-06-08 MED ORDER — IOHEXOL 300 MG/ML  SOLN
100.0000 mL | Freq: Once | INTRAMUSCULAR | Status: AC | PRN
  Administered 2022-06-09: 100 mL via INTRAVENOUS

## 2022-06-08 NOTE — ED Triage Notes (Signed)
Pt reports increased abdominal pain and emesis over the last few weeks. Pt reports the pain got intense tonight and she passed out but was caught by her partner before falling. Pt reports flu like symptoms as well.

## 2022-06-08 NOTE — ED Provider Notes (Signed)
[facility] Provider Note   CSN: [id number] Arrival date & time: 06/08/22  1932     History {Add pertinent medical, surgical, social history, OB history to HPI:1} Chief Complaint  Patient presents with   Abdominal Pain   Emesis    Danielle Evans is a 32 y.o. female with past medical history anemia, anxiety, depression, GERD, approximately 4 months postpartum who presents to the ED today complaining of right lower quadrant and suprapubic abdominal pain worse with urination.  Patient report reports that she was seen for the same on 05/29/2022 and had unremarkable lab work and CT abdomen/pelvis with contrast.  She states at that time she went home and later that night she passed a kidney stone and did have some improvement of her pain over the next few days but it never fully resolved.  She has continued to have right lower quadrant pain since that time with intermittent nausea and then tonight she had an episode where the pain became so severe that she felt lightheaded and like she might pass out while in the shower and then began vomiting uncontrollably so her partner prompted her to come to ED for further evaluation.  Also complains of recent fever up to 100 Fahrenheit, cough, congestion, and positive exposure to COVID-19 in other family members.  She denies hematuria, back pain, pelvic pain, chest pain, shortness of breath, leg pain or swelling. Unsure of pregnancy status. States she has low suspicion for STIs but would like to be tested while she is here. Intermittent vaginal spotting since birth of her child and subsequent IUD placement.  Prior abdominal surgeries include laparoscopic left unilateral salpingectomy. History of multiple kidney stones before. No history of frequent UTIs but was diagnosed with one a month ago for which she completed Bactrim and improved from this episode.      Home Medications Prior to Admission medications    Medication Sig Start Date End Date Taking? Authorizing Provider  norgestimate-ethinyl estradiol (ORTHO-CYCLEN) 0.25-35 MG-MCG tablet Take 1 tablet by mouth daily. 04/09/22   Darlina Rumpf, CNM  amphetamine-dextroamphetamine (ADDERALL) 5 MG tablet Take 5 mg by mouth 2 (two) times daily with a meal. 03/12/22   [provider]  calcium carbonate (TUMS - DOSED IN MG ELEMENTAL CALCIUM) 500 MG chewable tablet Chew 1 tablet by mouth daily.    [provider]  dicyclomine (BENTYL) 20 MG tablet Take 1 tablet (20 mg total) by mouth 3 (three) times daily before meals. 05/29/22   Sherwood Gambler, MD  famotidine (PEPCID) 20 MG tablet Take 1 tablet (20 mg total) by mouth daily as needed for up to 30 doses for heartburn. 04/16/22   Anyanwu, Sallyanne Havers, MD  gabapentin (NEURONTIN) 100 MG capsule Take 1 capsule (100 mg total) by mouth at bedtime. 09/27/21 08/23/22  Darlina Rumpf, CNM  hydrocortisone (ANUSOL-HC) 2.5 % rectal cream Place rectally 2 (two) times daily. Patient not taking: Reported on 03/14/2022 01/24/22   Osborne Oman, MD  iron polysaccharides (NIFEREX) 150 MG capsule Take 1 capsule (150 mg total) by mouth daily. Patient not taking: Reported on 03/14/2022 01/03/22 05/03/22  Darlina Rumpf, CNM  naproxen (NAPROSYN) 375 MG tablet Take 1 tablet (375 mg total) by mouth 2 (two) times daily. 05/29/22   Sherwood Gambler, MD  ondansetron (ZOFRAN-ODT) 4 MG disintegrating tablet Take 1 tablet (4 mg total) by mouth every 6 (six) hours as needed for nausea. 04/26/22   Darlina Rumpf, CNM  Prenatal Vit-Fe Fumarate-FA (PRENATAL MULTIVITAMIN) TABS tablet Take 1 tablet by mouth daily at 12 noon. Patient not taking: Reported on 03/14/2022    [provider]  valACYclovir (VALTREX) 500 MG tablet Take 1 tablet (500 mg total) by mouth 2 (two) times daily. Patient not taking: Reported on 03/14/2022 01/03/22   Darlina Rumpf, CNM  etonogestrel-ethinyl estradiol (NUVARING)  0.12-0.015 MG/24HR vaginal ring Use 1 vaginal ring every 3 weeks without week-free 02/13/12 08/23/15  Delsa Bern, MD  norethindrone-ethinyl estradiol 1/35 (Gays 1/35, 28,) tablet 1 tablet by mouth 3 times daily for 7 days, then begin one table daily thereafter 09/24/11 08/23/15  Eldred Manges, MD      Allergies    Ceclor [cefaclor], Morphine and related, and Promethazine    Review of Systems   Review of Systems  All other systems reviewed and are negative.   Physical Exam Updated Vital Signs BP (!) 140/85 (BP Location: Left Arm)   Pulse (!) 105   Temp 98.1 F (36.7 C) (Oral)   Resp 16   Ht 5' 7"$  (1.702 m)   Wt 92.5 kg   LMP 04/17/2022 (Approximate)   SpO2 100%   BMI 31.95 kg/m  Physical Exam Vitals and nursing note reviewed.  Constitutional:      General: She is not in acute distress.    Appearance: Normal appearance. She is not toxic-appearing or diaphoretic.  HENT:     Head: Normocephalic and atraumatic.     Mouth/Throat:     Mouth: Mucous membranes are dry.     Pharynx: Oropharynx is clear.  Eyes:     General: No scleral icterus.    Extraocular Movements: Extraocular movements intact.     Conjunctiva/sclera: Conjunctivae normal.     Pupils: Pupils are equal, round, and reactive to light.  Cardiovascular:     Rate and Rhythm: Regular rhythm. Tachycardia present.     Heart sounds: No murmur heard. Pulmonary:     Effort: Pulmonary effort is normal. No respiratory distress.     Breath sounds: Normal breath sounds. No stridor. No wheezing, rhonchi or rales.  Chest:     Chest wall: No tenderness.  Abdominal:     General: Abdomen is flat.     Palpations: Abdomen is soft. There is no shifting dullness, fluid wave, hepatomegaly, splenomegaly or pulsatile mass.     Tenderness: There is abdominal tenderness in the right lower quadrant, periumbilical area and suprapubic area. There is no right CVA tenderness, left CVA tenderness, guarding or rebound. Positive  signs include McBurney's sign. Negative signs include Murphy's sign, Rovsing's sign, psoas sign and obturator sign.     Hernia: No hernia is present.  Musculoskeletal:        General: Normal range of motion.     Cervical back: Neck supple.     Right lower leg: No edema.     Left lower leg: No edema.  Skin:    General: Skin is warm and dry.     Capillary Refill: Capillary refill takes 2 to 3 seconds.  Neurological:     General: No focal deficit present.     Mental Status: She is alert and oriented to person, place, and time. Mental status is at baseline.  Psychiatric:        Mood and Affect: Mood normal.        Behavior: Behavior normal.     ED Results / Procedures / Treatments   Labs (all labs ordered are listed, but only abnormal  results are displayed) Labs Reviewed  CBC WITH DIFFERENTIAL/PLATELET - Abnormal; Notable for the following components:      Result Value   Hemoglobin 11.3 (*)    MCH 25.4 (*)    All other components within normal limits  RESP PANEL BY RT-PCR (RSV, FLU A&B, COVID)  RVPGX2  COMPREHENSIVE METABOLIC PANEL  LIPASE, BLOOD  URINALYSIS, ROUTINE W REFLEX MICROSCOPIC  PREGNANCY, URINE  HIV ANTIBODY (ROUTINE TESTING W REFLEX)  RPR  GC/CHLAMYDIA PROBE AMP ([place]) NOT AT [facility]    EKG None  Radiology No results found.  Procedures Procedures  {Document cardiac monitor, telemetry assessment procedure when appropriate:1}  Medications Ordered in ED Medications  sodium chloride 0.9 % bolus 1,000 mL (has no administration in time range)  ondansetron (ZOFRAN) injection 4 mg (4 mg Intravenous Given 06/08/22 2111)  ketorolac (TORADOL) 15 MG/ML injection 15 mg (15 mg Intravenous Given 06/08/22 2110)    ED Course/ Medical Decision Making/ A&P   {   Click here for ABCD2, HEART and other calculatorsREFRESH Note before signing :1}                          Medical Decision Making Amount and/or Complexity of Data Reviewed Labs: ordered. Decision-making  details documented in ED Course. Radiology: ordered. Decision-making details documented in ED Course.  Risk Prescription drug management.   ***  {Document critical care time when appropriate:1} {Document review of labs and clinical decision tools ie heart score, Chads2Vasc2 etc:1}  {Document your independent review of radiology images, and any outside records:1} {Document your discussion with family members, caretakers, and with consultants:1} {Document social determinants of health affecting pt's care:1} {Document your decision making why or why not admission, treatments were needed:1} Final Clinical Impression(s) / ED Diagnoses Final diagnoses:  None    Rx / DC Orders ED Discharge Orders     None

## 2022-06-09 ENCOUNTER — Emergency Department (HOSPITAL_COMMUNITY): Payer: Medicaid Other

## 2022-06-09 MED ORDER — DICYCLOMINE HCL 20 MG PO TABS: 20.0000 mg | ORAL_TABLET | Freq: Two times a day (BID) | ORAL | 0 refills | Status: AC

## 2022-06-09 MED ORDER — CIPROFLOXACIN HCL 500 MG PO TABS
500.0000 mg | ORAL_TABLET | Freq: Once | ORAL | Status: AC
  Administered 2022-06-09: 500 mg via ORAL
  Filled 2022-06-09: qty 1

## 2022-06-09 MED ORDER — CIPROFLOXACIN HCL 500 MG PO TABS: 500.0000 mg | ORAL_TABLET | Freq: Two times a day (BID) | ORAL | 0 refills | Status: AC

## 2022-06-09 MED ORDER — HYDROMORPHONE HCL 1 MG/ML IJ SOLN
0.5000 mg | Freq: Once | INTRAMUSCULAR | Status: AC
  Administered 2022-06-09: 0.5 mg via INTRAVENOUS
  Filled 2022-06-09: qty 1

## 2022-06-09 MED ORDER — ONDANSETRON HCL 4 MG PO TABS: 4.0000 mg | ORAL_TABLET | Freq: Three times a day (TID) | ORAL | 0 refills | Status: AC | PRN

## 2022-06-09 NOTE — ED Notes (Signed)
Lab to add on urine culture

## 2022-06-09 NOTE — ED Notes (Signed)
Pt in CT.

## 2022-06-09 NOTE — Discharge Instructions (Addendum)
Thank you for letting us take care of you today.  As discussed, I believe it is important you follow up with urology to discuss recurrent urinary tract infections and kidney stones. I have provided the contact information for our on call urologist. Please contact them and schedule a follow up appointment within one week.  Please take the antibiotics to treat your urinary tract infection as prescribed. It is important to complete all of these.  You also appear a bit constipated on CT scan, please follow these instructions to manage any constipation at home: Mix 4 doses of miralax into 32 oz of a sports drink like Gatorade. Drink over 2 hours or until you have a good bowel movement. You may repeat once more if needed.   If you develop worsening symptoms such as high fever uncontrolled with over the counter medication, worsening abdominal pain, inability to urine, uncontrollable vomiting with using medication prescribed, or other concerns, please return to nearest emergency department for re-evaluation.  Please view MyChart for results of STI screening during today's visit.

## 2022-06-10 LAB — URINE CULTURE: Culture: NO GROWTH

## 2022-06-10 LAB — GC/CHLAMYDIA PROBE AMP (~~LOC~~) NOT AT ARMC
Chlamydia: NEGATIVE
Comment: NEGATIVE
Comment: NORMAL
Neisseria Gonorrhea: NEGATIVE

## 2022-06-19 ENCOUNTER — Encounter: Payer: Self-pay | Admitting: Advanced Practice Midwife

## 2022-06-19 ENCOUNTER — Other Ambulatory Visit: Payer: Self-pay | Admitting: Obstetrics & Gynecology

## 2022-06-19 MED ORDER — FAMOTIDINE 20 MG PO TABS: 20.0000 mg | ORAL_TABLET | Freq: Every day | ORAL | 0 refills | Status: AC | PRN

## 2022-09-19 ENCOUNTER — Encounter: Payer: Self-pay | Admitting: Advanced Practice Midwife

## 2022-11-05 ENCOUNTER — Other Ambulatory Visit: Payer: Self-pay | Admitting: *Deleted

## 2022-11-05 DIAGNOSIS — G4700 Insomnia, unspecified: Secondary | ICD-10-CM

## 2022-11-05 MED ORDER — GABAPENTIN 100 MG PO CAPS: 100.0000 mg | ORAL_CAPSULE | Freq: Every evening | ORAL | 5 refills | Status: AC | PRN

## 2023-01-21 ENCOUNTER — Encounter: Payer: Self-pay | Admitting: Obstetrics & Gynecology
